# Patient Record
Sex: Male | Born: 1940 | Race: Black or African American | Hispanic: No | Marital: Married | Smoking: Former smoker
Health system: Southern US, Community
[De-identification: ages and names within clinical notes are randomized; demographics above are authoritative.]

## PROBLEM LIST (undated history)

## (undated) DIAGNOSIS — K922 Gastrointestinal hemorrhage, unspecified: Secondary | ICD-10-CM

## (undated) DIAGNOSIS — K219 Gastro-esophageal reflux disease without esophagitis: Secondary | ICD-10-CM

## (undated) DIAGNOSIS — D62 Acute posthemorrhagic anemia: Secondary | ICD-10-CM

## (undated) DIAGNOSIS — E118 Type 2 diabetes mellitus with unspecified complications: Secondary | ICD-10-CM

## (undated) DIAGNOSIS — G039 Meningitis, unspecified: Secondary | ICD-10-CM

## (undated) DIAGNOSIS — E119 Type 2 diabetes mellitus without complications: Secondary | ICD-10-CM

## (undated) DIAGNOSIS — H269 Unspecified cataract: Secondary | ICD-10-CM

## (undated) DIAGNOSIS — J189 Pneumonia, unspecified organism: Secondary | ICD-10-CM

## (undated) DIAGNOSIS — K621 Rectal polyp: Secondary | ICD-10-CM

## (undated) DIAGNOSIS — I10 Essential (primary) hypertension: Secondary | ICD-10-CM

## (undated) DIAGNOSIS — E785 Hyperlipidemia, unspecified: Secondary | ICD-10-CM

## (undated) HISTORY — DX: Gastro-esophageal reflux disease without esophagitis: K21.9

## (undated) HISTORY — DX: Hyperlipidemia, unspecified: E78.5

## (undated) HISTORY — DX: Pneumonia, unspecified organism: J18.9

## (undated) HISTORY — DX: Type 2 diabetes mellitus with unspecified complications: E11.8

## (undated) HISTORY — DX: Unspecified cataract: H26.9

## (undated) HISTORY — DX: Essential (primary) hypertension: I10

## (undated) HISTORY — DX: Type 2 diabetes mellitus without complications: E11.9

## (undated) HISTORY — DX: Meningitis, unspecified: G03.9

## (undated) HISTORY — PX: DENTAL SURGERY: SHX609

---

## 2014-02-26 DIAGNOSIS — H4011X2 Primary open-angle glaucoma, moderate stage: Secondary | ICD-10-CM | POA: Diagnosis not present

## 2017-01-11 DIAGNOSIS — E119 Type 2 diabetes mellitus without complications: Secondary | ICD-10-CM | POA: Diagnosis not present

## 2017-01-11 DIAGNOSIS — Z961 Presence of intraocular lens: Secondary | ICD-10-CM | POA: Diagnosis not present

## 2017-01-11 DIAGNOSIS — H524 Presbyopia: Secondary | ICD-10-CM | POA: Diagnosis not present

## 2017-01-11 DIAGNOSIS — H401133 Primary open-angle glaucoma, bilateral, severe stage: Secondary | ICD-10-CM | POA: Diagnosis not present

## 2017-05-09 DIAGNOSIS — H401133 Primary open-angle glaucoma, bilateral, severe stage: Secondary | ICD-10-CM | POA: Diagnosis not present

## 2017-05-27 ENCOUNTER — Ambulatory Visit (INDEPENDENT_AMBULATORY_CARE_PROVIDER_SITE_OTHER): Payer: Medicare Other | Admitting: Family Medicine

## 2017-05-27 ENCOUNTER — Encounter: Payer: Self-pay | Admitting: Family Medicine

## 2017-05-27 ENCOUNTER — Other Ambulatory Visit: Payer: Self-pay

## 2017-05-27 VITALS — BP 200/98 | HR 43 | Temp 98.1°F | Resp 16 | Ht 69.5 in | Wt 173.2 lb

## 2017-05-27 DIAGNOSIS — I10 Essential (primary) hypertension: Secondary | ICD-10-CM | POA: Insufficient documentation

## 2017-05-27 DIAGNOSIS — I499 Cardiac arrhythmia, unspecified: Secondary | ICD-10-CM | POA: Diagnosis not present

## 2017-05-27 DIAGNOSIS — J209 Acute bronchitis, unspecified: Secondary | ICD-10-CM

## 2017-05-27 DIAGNOSIS — E782 Mixed hyperlipidemia: Secondary | ICD-10-CM | POA: Diagnosis not present

## 2017-05-27 DIAGNOSIS — J0100 Acute maxillary sinusitis, unspecified: Secondary | ICD-10-CM

## 2017-05-27 DIAGNOSIS — E119 Type 2 diabetes mellitus without complications: Secondary | ICD-10-CM | POA: Diagnosis not present

## 2017-05-27 MED ORDER — AZITHROMYCIN 250 MG PO TABS: ORAL_TABLET | ORAL | 0 refills | Status: DC

## 2017-05-27 MED ORDER — ROSUVASTATIN CALCIUM 5 MG PO TABS: ORAL_TABLET | ORAL | 0 refills | Status: DC

## 2017-05-27 MED ORDER — PREDNISONE 20 MG PO TABS: 40.0000 mg | ORAL_TABLET | Freq: Every day | ORAL | 0 refills | Status: DC

## 2017-05-27 MED ORDER — LOSARTAN POTASSIUM 50 MG PO TABS: 50.0000 mg | ORAL_TABLET | Freq: Every day | ORAL | 1 refills | Status: DC

## 2017-05-27 NOTE — Assessment & Plan Note (Addendum)
BP very elevated, He is asymptomatic, discussed risk for heart disease and stroke, states he feels fine besides the cold symptoms  When asked about arrhythmia states he has had a long time, never seen cardiologist, he is asymptomatic   Unforunately he does not seem to go to the doctor very often, few notes in care everywhere  With his HR in 50-60's will not give him clonidine  will have him return home and take his losartan but increasae to 50mg  Recheck BP on Monday

## 2017-05-27 NOTE — Progress Notes (Signed)
Subjective:    Patient ID: Ryan Mosley, male    DOB: [birth year], 77 y.o.   MRN: [id number]  Patient presents for New Patient (Initial Visit)  Here to establish care.  His previous PCP with [facility] internal medicine Dr. Baldemar Lenis Ophthalmology Dr. Louann Sjogren with wife ,Has 4 children , retired [employer]   History of diabetes mellitus but is not taking any of his medications.  The last visit noted in the chart from July 2018 states that he was on glipizide he had not been taking it because he felt it was causing diarrhea.  But he has a bottle of metformin not taking that either, The last A1c was 7.3%. CBG this morning  116, highest per patient gets up to 120's, check every other day  hypertension he was prescribed losartan had been taking that medication but did not take today,. His last BP recorded at PCP was 140/82   Hyperlipidemia prescribed Lipitor in the past but he stopped taking secondary to leg pain, he was also prescribed Crestor at one point.  Allergies/nasal congestion- gven zyrtec - bottle from 2017, and singular neither have helped, has not seen ENT/ or allergies  he gets congested with sinus pressure and drainage, no fever , has also used sprays in the past   Has been coughing phlegm for past few weeks, also blowing nose a lot , has had wheezing  He has been using advil sinus  qUIT SMOKING 10-15 years, ago, occ etoh chews tobacco     He declines Pneumonia, TDAP, SHinlges, gets flu shot every year   Review Of Systems:  GEN- denies fatigue, fever, weight loss,weakness, recent illness HEENT- denies eye drainage, change in vision, nasal discharge, CVS- denies chest pain, palpitations RESP- denies SOB,+ cough, +wheeze ABD- denies N/V, change in stools, abd pain GU- denies dysuria, hematuria, dribbling, incontinence MSK- denies joint pain, muscle aches, injury Neuro- denies headache, dizziness, syncope, seizure activity       Objective:     BP (!) 200/98 (BP Location: Left Arm)   Pulse (!) 43   Temp 98.1 F (36.7 C) (Oral)   Resp 16   Ht 5' 9.5" (1.765 m)   Wt 173 lb 3.2 oz (78.6 kg)   SpO2 98%   BMI 25.21 kg/m  GEN- NAD, alert and oriented x3 HEENT- PERRL, EOMI, non injected sclera, pink conjunctiva, MMM, oropharynx clear, nasal congestion , enlarged turbinates, TM clear bilat no effusion, mild maxillary sinus tenderness  Neck- Supple, no thyromegaly, no LAD CVS- bradycardia , irregular rhythem no murmur RESP- scattered wheeze, normal WOB, no rales, mild congestion  ABD-NABS,soft,NT,ND EXT- No edema Pulses- Radial 2+  EKG- multiple PVC, bigeminy rhythm , p waves noted       Assessment & Plan:   Stop decngestants    Problem List Items Addressed This Visit      Unprioritized   Diabetes mellitus without complication ([place])    Uncontrolled per previous note, no recent A1C refuses to take any medications due to side effects Check renal function and cholesteorol       Relevant Medications   losartan (COZAAR) 50 MG tablet   rosuvastatin (CRESTOR) 5 MG tablet   Other Relevant Orders   Hemoglobin A1c (Completed)   Lipid panel (Completed)   Hypertension    BP very elevated, He is asymptomatic, discussed risk for heart disease and stroke, states he feels fine besides the cold symptoms  When asked about arrhythmia states he has had a  long time, never seen cardiologist, he is asymptomatic   Unforunately he does not seem to go to the doctor very often, few notes in care everywhere  With his HR in 50-60's will not give him clonidine  will have him return home and take his losartan but increasae to 50mg  Recheck BP on Monday       Relevant Medications   losartan (COZAAR) 50 MG tablet   rosuvastatin (CRESTOR) 5 MG tablet   Other Relevant Orders   CBC with Differential/Platelet (Completed)   Comprehensive metabolic panel (Completed)   Hyperlipemia    He agrees to take statin once a week      Relevant  Medications   losartan (COZAAR) 50 MG tablet   rosuvastatin (CRESTOR) 5 MG tablet    Other Visit Diagnoses    Cardiac arrhythmia, unspecified cardiac arrhythmia type    -  Primary   Multiple PVC, sinus rhythem,   Relevant Medications   losartan (COZAAR) 50 MG tablet   rosuvastatin (CRESTOR) 5 MG tablet   Other Relevant Orders   EKG 12-Lead (Completed)   Acute bronchitis, unspecified organism       Treat with prednisone, without known A1C high risl for PNA with his age, given zpak, robitussin DM or coricidan   Acute non-recurrent maxillary sinusitis       Relevant Medications   azithromycin (ZITHROMAX) 250 MG tablet   predniSONE (DELTASONE) 20 MG tablet      Note: This dictation was prepared with Dragon dictation along with smaller phrase technology. Any transcriptional errors that result from this process are unintentional.

## 2017-05-27 NOTE — Assessment & Plan Note (Signed)
Uncontrolled per previous note, no recent A1C refuses to take any medications due to side effects Check renal function and cholesteorol

## 2017-05-27 NOTE — Patient Instructions (Addendum)
Take 2 of the losartan tablets 50mg  when you get home New script will be for 50mg  Take the crestor- cholesterol pill once a week  Take antibiotics and the prednisone, use robitussin or Coricidan for cough DO NOT TAKE THE ADVIL SINUS PILL F/U Monday to review your labs and recheck blood pressure

## 2017-05-28 LAB — CBC WITH DIFFERENTIAL/PLATELET
Basophils Absolute: 30 cells/uL (ref 0–200)
Basophils Relative: 0.3 %
Eosinophils Absolute: 129 cells/uL (ref 15–500)
Eosinophils Relative: 1.3 %
HCT: 45.5 % (ref 38.5–50.0)
Hemoglobin: 15.3 g/dL (ref 13.2–17.1)
Lymphs Abs: 2010 cells/uL (ref 850–3900)
MCH: 29.7 pg (ref 27.0–33.0)
MCHC: 33.6 g/dL (ref 32.0–36.0)
MCV: 88.2 fL (ref 80.0–100.0)
MPV: 10 fL (ref 7.5–12.5)
Monocytes Relative: 8.7 %
Neutro Abs: 6871 cells/uL (ref 1500–7800)
Neutrophils Relative %: 69.4 %
Platelets: 241 10*3/uL (ref 140–400)
RBC: 5.16 10*6/uL (ref 4.20–5.80)
RDW: 12.7 % (ref 11.0–15.0)
Total Lymphocyte: 20.3 %
WBC mixed population: 861 cells/uL (ref 200–950)
WBC: 9.9 10*3/uL (ref 3.8–10.8)

## 2017-05-28 LAB — COMPREHENSIVE METABOLIC PANEL
AG Ratio: 1.5 (calc) (ref 1.0–2.5)
ALT: 22 U/L (ref 9–46)
AST: 17 U/L (ref 10–35)
Albumin: 4.4 g/dL (ref 3.6–5.1)
Alkaline phosphatase (APISO): 94 U/L (ref 40–115)
BUN: 12 mg/dL (ref 7–25)
CO2: 26 mmol/L (ref 20–32)
Calcium: 9.4 mg/dL (ref 8.6–10.3)
Chloride: 104 mmol/L (ref 98–110)
Creat: 0.86 mg/dL (ref 0.70–1.18)
Globulin: 2.9 g/dL (calc) (ref 1.9–3.7)
Glucose, Bld: 127 mg/dL — ABNORMAL HIGH (ref 65–99)
Potassium: 3.9 mmol/L (ref 3.5–5.3)
Sodium: 139 mmol/L (ref 135–146)
Total Bilirubin: 0.6 mg/dL (ref 0.2–1.2)
Total Protein: 7.3 g/dL (ref 6.1–8.1)

## 2017-05-28 LAB — LIPID PANEL
Cholesterol: 236 mg/dL — ABNORMAL HIGH (ref <200)
HDL: 37 mg/dL — ABNORMAL LOW (ref >40)
LDL Cholesterol (Calc): 175 mg/dL (calc) — ABNORMAL HIGH
Non-HDL Cholesterol (Calc): 199 mg/dL (calc) — ABNORMAL HIGH (ref <130)
Total CHOL/HDL Ratio: 6.4 (calc) — ABNORMAL HIGH (ref <5.0)
Triglycerides: 112 mg/dL (ref <150)

## 2017-05-28 LAB — HEMOGLOBIN A1C
Hgb A1c MFr Bld: 6.9 % of total Hgb — ABNORMAL HIGH (ref <5.7)
Mean Plasma Glucose: 151 (calc)
eAG (mmol/L): 8.4 (calc)

## 2017-05-29 DIAGNOSIS — E785 Hyperlipidemia, unspecified: Secondary | ICD-10-CM | POA: Insufficient documentation

## 2017-05-29 NOTE — Assessment & Plan Note (Signed)
He agrees to take statin once a week

## 2017-05-30 ENCOUNTER — Other Ambulatory Visit: Payer: Self-pay

## 2017-05-30 ENCOUNTER — Encounter: Payer: Self-pay | Admitting: Family Medicine

## 2017-05-30 ENCOUNTER — Ambulatory Visit (INDEPENDENT_AMBULATORY_CARE_PROVIDER_SITE_OTHER): Payer: Medicare Other | Admitting: Family Medicine

## 2017-05-30 VITALS — BP 152/68 | HR 56 | Temp 97.9°F | Resp 16 | Ht 69.5 in | Wt 174.0 lb

## 2017-05-30 DIAGNOSIS — E119 Type 2 diabetes mellitus without complications: Secondary | ICD-10-CM

## 2017-05-30 DIAGNOSIS — E782 Mixed hyperlipidemia: Secondary | ICD-10-CM | POA: Diagnosis not present

## 2017-05-30 DIAGNOSIS — I10 Essential (primary) hypertension: Secondary | ICD-10-CM | POA: Diagnosis not present

## 2017-05-30 NOTE — Progress Notes (Signed)
   Subjective:    Patient ID: Ryan Mosley, male    DOB: 1940-03-01, 77 y.o.   MRN: [id number]  Patient presents for Follow-up (recheck BP)  Pt here for BP f/u, and REVIEW Of labs  Wife also present A1C 6.9%, no current meds, normal renal and liver function Hyperlioidemia- LDL elevated175 HTN- taking losartan 50mg  BP 153/79 this morning states that he does feel better  Also completing antibiotics and the prednisone his symptoms have improved.  Regard to the cardiac arrhythmia he does not want anything done at this time states that he feels fine has had for a while   Review Of Systems:  GEN- denies fatigue, fever, weight loss,weakness, recent illness HEENT- denies eye drainage, change in vision, nasal discharge, CVS- denies chest pain, palpitations RESP- denies SOB, cough, wheeze Neuro- denies headache, dizziness, syncope, seizure activity       Objective:    BP (!) 152/68   Pulse (!) 56   Temp 97.9 F (36.6 C) (Oral)   Resp 16   Ht 5' 9.5" (1.765 m)   Wt 174 lb (78.9 kg)   SpO2 96%   BMI 25.33 kg/m  GEN- NAD, alert and oriented x3 HEENT- PERRL, EOMI, non injected sclera, pink conjunctiva, MMM, oropharynx clear CVS- bradycardia no murmur RESP-CTAB EXT- No edema Pulses- Radial 2+        Assessment & Plan:      Problem List Items Addressed This Visit      Unprioritized   Hypertension - Primary    Pressure is much improved continue losartan 50 mg.  He will continue to monitor at home.      Hyperlipemia    He will take Crestor once a week to reduce his myalgias      Diabetes mellitus without complication ([place])    At this time we discussed to monitor his blood sugars.  Seems that he had a lot of hypoglycemic symptoms when he was on medications.  A1c currently 6.9%         Note: This dictation was prepared with Dragon dictation along with smaller phrase technology. Any transcriptional errors that result from this process are unintentional.

## 2017-05-30 NOTE — Patient Instructions (Signed)
F/U  3 months 

## 2017-05-31 ENCOUNTER — Encounter: Payer: Self-pay | Admitting: Family Medicine

## 2017-05-31 NOTE — Assessment & Plan Note (Signed)
At this time we discussed to monitor his blood sugars.  Seems that he had a lot of hypoglycemic symptoms when he was on medications.  A1c currently 6.9%

## 2017-05-31 NOTE — Assessment & Plan Note (Signed)
Pressure is much improved continue losartan 50 mg.  He will continue to monitor at home.

## 2017-05-31 NOTE — Assessment & Plan Note (Signed)
He will take Crestor once a week to reduce his myalgias

## 2017-09-05 ENCOUNTER — Other Ambulatory Visit: Payer: Self-pay

## 2017-09-05 ENCOUNTER — Encounter: Payer: Self-pay | Admitting: Family Medicine

## 2017-09-05 ENCOUNTER — Ambulatory Visit (INDEPENDENT_AMBULATORY_CARE_PROVIDER_SITE_OTHER): Payer: Medicare Other | Admitting: Family Medicine

## 2017-09-05 VITALS — BP 148/68 | HR 48 | Temp 98.1°F | Resp 16 | Ht 69.5 in | Wt 173.0 lb

## 2017-09-05 DIAGNOSIS — I10 Essential (primary) hypertension: Secondary | ICD-10-CM | POA: Diagnosis not present

## 2017-09-05 DIAGNOSIS — E782 Mixed hyperlipidemia: Secondary | ICD-10-CM | POA: Diagnosis not present

## 2017-09-05 DIAGNOSIS — E119 Type 2 diabetes mellitus without complications: Secondary | ICD-10-CM | POA: Diagnosis not present

## 2017-09-05 MED ORDER — ROSUVASTATIN CALCIUM 5 MG PO TABS: ORAL_TABLET | ORAL | 2 refills | Status: DC

## 2017-09-05 MED ORDER — LISINOPRIL 10 MG PO TABS: 10.0000 mg | ORAL_TABLET | Freq: Every day | ORAL | 6 refills | Status: DC

## 2017-09-05 NOTE — Patient Instructions (Signed)
F/U 6 months for Physical  

## 2017-09-05 NOTE — Assessment & Plan Note (Signed)
Recheck A1C, diet controlled Continue checking fasting CBG Based on our few visits difficult to get him on board with taking medications

## 2017-09-05 NOTE — Progress Notes (Signed)
   Subjective:    Patient ID: Ryan Mosley, male    DOB: [birth year], 77 y.o.   MRN: [id number]  Patient presents for Follow-up (is fasting)  Pt here to f/u HTN and DM  DM- no current medications, A1C was 6.9% in April, states sugars run good  HTN- stopped losartan a few weeks ago, bottle had about 20-30 tabs out of 90 days supply. He would skip some days or forget, he states the losartan gave him all kinds of side effects, headache, body aches, blurry vision, so he stopped it and has been fine.   Hyperlipidemia- takes crestor once a week    Review Of Systems: per above  GEN- denies fatigue, fever, weight loss,weakness, recent illness HEENT- denies eye drainage, change in vision, nasal discharge, CVS- denies chest pain, palpitations RESP- denies SOB, cough, wheeze ABD- denies N/V, change in stools, abd pain GU- denies dysuria, hematuria, dribbling, incontinence MSK- denies joint pain, muscle aches, injury Neuro- denies headache, dizziness, syncope, seizure activity       Objective:    BP (!) 148/68   Pulse (!) 48   Temp 98.1 F (36.7 C) (Oral)   Resp 16   Ht 5' 9.5" (1.765 m)   Wt 173 lb (78.5 kg)   SpO2 97%   BMI 25.18 kg/m  GEN- NAD, alert and oriented x3 HEENT- PERRL, EOMI, non injected sclera, pink conjunctiva, MMM, oropharynx clear Neck- Supple, no thyromegaly CVS- bradycardia, irregular rhythem no murmur RESP-CTAB EXT- No edema Pulses- Radial, DP- 2+        Assessment & Plan:      Problem List Items Addressed This Visit    None      Note: This dictation was prepared with Dragon dictation along with smaller phrase technology. Any transcriptional errors that result from this process are unintentional.

## 2017-09-05 NOTE — Assessment & Plan Note (Signed)
Continue crestor weekly

## 2017-09-05 NOTE — Assessment & Plan Note (Signed)
BP does not look to bad Pt adamant the pill caused him all kinds of SE Will d/c try lisinopril 10mg  once a day  Advised to call me if any concerns instead of just stopping and not alerting anyone

## 2017-09-06 LAB — COMPREHENSIVE METABOLIC PANEL
AG Ratio: 1.5 (calc) (ref 1.0–2.5)
ALT: 16 U/L (ref 9–46)
AST: 17 U/L (ref 10–35)
Albumin: 4.5 g/dL (ref 3.6–5.1)
Alkaline phosphatase (APISO): 81 U/L (ref 40–115)
BUN: 13 mg/dL (ref 7–25)
CO2: 26 mmol/L (ref 20–32)
Calcium: 9.5 mg/dL (ref 8.6–10.3)
Chloride: 104 mmol/L (ref 98–110)
Creat: 0.84 mg/dL (ref 0.70–1.18)
Globulin: 3.1 g/dL (calc) (ref 1.9–3.7)
Glucose, Bld: 146 mg/dL — ABNORMAL HIGH (ref 65–99)
Potassium: 4.1 mmol/L (ref 3.5–5.3)
Sodium: 140 mmol/L (ref 135–146)
Total Bilirubin: 0.5 mg/dL (ref 0.2–1.2)
Total Protein: 7.6 g/dL (ref 6.1–8.1)

## 2017-09-06 LAB — CBC WITH DIFFERENTIAL/PLATELET
Basophils Absolute: 38 cells/uL (ref 0–200)
Basophils Relative: 0.5 %
Eosinophils Absolute: 190 cells/uL (ref 15–500)
Eosinophils Relative: 2.5 %
HCT: 47 % (ref 38.5–50.0)
Hemoglobin: 15.3 g/dL (ref 13.2–17.1)
Lymphs Abs: 1870 cells/uL (ref 850–3900)
MCH: 29.4 pg (ref 27.0–33.0)
MCHC: 32.6 g/dL (ref 32.0–36.0)
MCV: 90.2 fL (ref 80.0–100.0)
MPV: 10.4 fL (ref 7.5–12.5)
Monocytes Relative: 9.9 %
Neutro Abs: 4750 cells/uL (ref 1500–7800)
Neutrophils Relative %: 62.5 %
Platelets: 229 10*3/uL (ref 140–400)
RBC: 5.21 10*6/uL (ref 4.20–5.80)
RDW: 12.1 % (ref 11.0–15.0)
Total Lymphocyte: 24.6 %
WBC mixed population: 752 cells/uL (ref 200–950)
WBC: 7.6 10*3/uL (ref 3.8–10.8)

## 2017-09-06 LAB — HEMOGLOBIN A1C
Hgb A1c MFr Bld: 7.1 % of total Hgb — ABNORMAL HIGH (ref <5.7)
Mean Plasma Glucose: 157 (calc)
eAG (mmol/L): 8.7 (calc)

## 2017-09-06 LAB — LIPID PANEL
Cholesterol: 193 mg/dL (ref <200)
HDL: 37 mg/dL — ABNORMAL LOW (ref >40)
LDL Cholesterol (Calc): 139 mg/dL (calc) — ABNORMAL HIGH
Non-HDL Cholesterol (Calc): 156 mg/dL (calc) — ABNORMAL HIGH (ref <130)
Total CHOL/HDL Ratio: 5.2 (calc) — ABNORMAL HIGH (ref <5.0)
Triglycerides: 80 mg/dL (ref <150)

## 2017-09-09 DIAGNOSIS — H401131 Primary open-angle glaucoma, bilateral, mild stage: Secondary | ICD-10-CM | POA: Diagnosis not present

## 2017-11-21 ENCOUNTER — Other Ambulatory Visit: Payer: Self-pay | Admitting: Family Medicine

## 2017-12-13 ENCOUNTER — Other Ambulatory Visit: Payer: Self-pay | Admitting: *Deleted

## 2018-03-10 ENCOUNTER — Other Ambulatory Visit: Payer: Self-pay | Admitting: Family Medicine

## 2018-03-10 ENCOUNTER — Encounter: Payer: Medicare Other | Admitting: Family Medicine

## 2018-03-13 ENCOUNTER — Other Ambulatory Visit: Payer: Self-pay

## 2018-03-13 ENCOUNTER — Encounter: Payer: Self-pay | Admitting: Family Medicine

## 2018-03-13 ENCOUNTER — Ambulatory Visit (INDEPENDENT_AMBULATORY_CARE_PROVIDER_SITE_OTHER): Payer: Medicare Other | Admitting: Family Medicine

## 2018-03-13 VITALS — BP 142/86 | HR 78 | Temp 97.9°F | Resp 16 | Ht 69.5 in | Wt 166.0 lb

## 2018-03-13 DIAGNOSIS — I10 Essential (primary) hypertension: Secondary | ICD-10-CM | POA: Diagnosis not present

## 2018-03-13 DIAGNOSIS — E119 Type 2 diabetes mellitus without complications: Secondary | ICD-10-CM

## 2018-03-13 DIAGNOSIS — Z0000 Encounter for general adult medical examination without abnormal findings: Secondary | ICD-10-CM

## 2018-03-13 DIAGNOSIS — E782 Mixed hyperlipidemia: Secondary | ICD-10-CM

## 2018-03-13 NOTE — Progress Notes (Signed)
Subjective:   Patient presents for Medicare Annual/Subsequent preventive examination.   Pt here for wellness exam. Weight down 7lbs since July,s tates his appetite is good, some times he is out doing a lot working he will lose weight.  He denies any pain anywhere.  Does not want any excessive testing.   Hyperlipidemia- taking    HTN- taking lisinopril as prescribed   No concerns   Review Past Medical/Family/Social: Per EMR    Risk Factors  Current exercise habits: yes Dietary issues discussed: YES  Cardiac risk factors: Obesity (BMI >= 30 kg/m2).   Depression Screen  (Note: if answer to either of the following is "Yes", a more complete depression screening is indicated)  Over the past two weeks, have you felt down, depressed or hopeless? No Over the past two weeks, have you felt little interest or pleasure in doing things? No Have you lost interest or pleasure in daily life? No Do you often feel hopeless? No Do you cry easily over simple problems? No   Activities of Daily Living  In your present state of health, do you have any difficulty performing the following activities?:  Driving? No  Managing money? No  Feeding yourself? No  Getting from bed to chair? No  Climbing a flight of stairs? No  Preparing food and eating?: No  Bathing or showering? No  Getting dressed: No  Getting to the toilet? No  Using the toilet:No  Moving around from place to place: No  In the past year have you fallen or had a near fall?:No  Are you sexually active? No  Do you have more than one partner? No   Hearing Difficulties: No  Do you often ask people to speak up or repeat themselves? No  Do you experience ringing or noises in your ears? No Do you have difficulty understanding soft or whispered voices? No  Do you feel that you have a problem with memory? No Do you often misplace items? No  Do you feel safe at home? Yes  Cognitive Testing  Alert? Yes Normal Appearance?Yes  Oriented to  person? Yes Place? Yes  Time? Yes  Recall of three objects? Yes  Can perform simple calculations? Yes  Displays appropriate judgment?Yes  Can read the correct time from a watch face?Yes   List the Names of Other Physician/Practitioners you currently use:   Dr. Jearld Fenton    Screening Tests / Date Colonoscopy over age                     Zostavax Declines  Pneumonia- declines  Influenza Vaccine UTD Tetanus/tdap Declines   ROS: GEN- denies fatigue, fever, weight loss,weakness, recent illness HEENT- denies eye drainage, change in vision, nasal discharge, CVS- denies chest pain, palpitations RESP- denies SOB, cough, wheeze ABD- denies N/V, change in stools, abd pain GU- denies dysuria, hematuria, dribbling, incontinence MSK- denies joint pain, muscle aches, injury Neuro- denies headache, dizziness, syncope, seizure activity  Physical: Vitals reviewed  GEN- NAD, alert and oriented x3 HEENT- PERRL, EOMI, non injected sclera, pink conjunctiva, MMM, oropharynx clear Neck- Supple, no thryomegaly, no bruit  CVS- irregular rhythem, normal rate ( ? PVC), no murmur RESP-CTAB ABD-NABS,soft,NT,ND EXT- No edema Pulses- Radial, DP- 2+   Assessment:    Annual wellness medicare exam   Plan:    During the course of the visit the patient was educated and counseled about appropriate screening and preventive services including:  FALL/ADUIT C/ Depression screen  Negative  Weight loss-declines  any pain has a good appetite.  He has had some stress as his wife was recently in the hospital.  Discussed trying Ensure boost or multivitamins he states that it was too expensive and that he would not full with it.  Also gave him the idea of Carnation instant breakfast.  Hypertension his blood pressure is controlled for his age we will continue the lisinopril he does not typically want to take any medications.  He is also taking his statin drug we will check his lipid panel.  Declines  PSA testing  which is reasonable at his age.  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  Cardiac yarrhythmia chronic without any symptoms.  He does not want any evaluation for this.   Patient Instructions (the written plan) was given to the patient.  Medicare Attestation  I have personally reviewed:  The patient's medical and social history  Their use of alcohol, tobacco or illicit drugs  Their current medications and supplements  The patient's functional ability including ADLs,fall risks, home safety risks, cognitive, and hearing and visual impairment  Diet and physical activities  Evidence for depression or mood disorders  The patient's weight, height, BMI, and visual acuity have been recorded in the chart. I have made referrals, counseling, and provided education to the patient based on review of the above and I have provided the patient with a written personalized care plan for preventive services.

## 2018-03-13 NOTE — Patient Instructions (Addendum)
Try the carnation instant breakfast  Multivitamin over 50 F/U 6 months

## 2018-03-14 LAB — LIPID PANEL
Cholesterol: 178 mg/dL (ref <200)
HDL: 39 mg/dL — ABNORMAL LOW (ref >40)
LDL Cholesterol (Calc): 122 mg/dL (calc) — ABNORMAL HIGH
Non-HDL Cholesterol (Calc): 139 mg/dL (calc) — ABNORMAL HIGH (ref <130)
Total CHOL/HDL Ratio: 4.6 (calc) (ref <5.0)
Triglycerides: 78 mg/dL (ref <150)

## 2018-03-14 LAB — HEMOGLOBIN A1C
Hgb A1c MFr Bld: 6.4 % of total Hgb — ABNORMAL HIGH (ref <5.7)
Mean Plasma Glucose: 137 (calc)
eAG (mmol/L): 7.6 (calc)

## 2018-03-14 LAB — COMPREHENSIVE METABOLIC PANEL
AG Ratio: 1.6 (calc) (ref 1.0–2.5)
ALT: 18 U/L (ref 9–46)
AST: 16 U/L (ref 10–35)
Albumin: 4.4 g/dL (ref 3.6–5.1)
Alkaline phosphatase (APISO): 74 U/L (ref 40–115)
BUN: 14 mg/dL (ref 7–25)
CO2: 27 mmol/L (ref 20–32)
Calcium: 9.5 mg/dL (ref 8.6–10.3)
Chloride: 105 mmol/L (ref 98–110)
Creat: 0.98 mg/dL (ref 0.70–1.18)
Globulin: 2.8 g/dL (calc) (ref 1.9–3.7)
Glucose, Bld: 120 mg/dL — ABNORMAL HIGH (ref 65–99)
Potassium: 3.6 mmol/L (ref 3.5–5.3)
Sodium: 141 mmol/L (ref 135–146)
Total Bilirubin: 0.7 mg/dL (ref 0.2–1.2)
Total Protein: 7.2 g/dL (ref 6.1–8.1)

## 2018-03-14 LAB — CBC WITH DIFFERENTIAL/PLATELET
Absolute Monocytes: 661 cells/uL (ref 200–950)
Basophils Absolute: 38 cells/uL (ref 0–200)
Basophils Relative: 0.5 %
Eosinophils Absolute: 68 cells/uL (ref 15–500)
Eosinophils Relative: 0.9 %
HCT: 46.3 % (ref 38.5–50.0)
Hemoglobin: 15.5 g/dL (ref 13.2–17.1)
Lymphs Abs: 1763 cells/uL (ref 850–3900)
MCH: 30.5 pg (ref 27.0–33.0)
MCHC: 33.5 g/dL (ref 32.0–36.0)
MCV: 91.1 fL (ref 80.0–100.0)
MPV: 10.8 fL (ref 7.5–12.5)
Monocytes Relative: 8.7 %
Neutro Abs: 5069 cells/uL (ref 1500–7800)
Neutrophils Relative %: 66.7 %
Platelets: 207 10*3/uL (ref 140–400)
RBC: 5.08 10*6/uL (ref 4.20–5.80)
RDW: 12.6 % (ref 11.0–15.0)
Total Lymphocyte: 23.2 %
WBC: 7.6 10*3/uL (ref 3.8–10.8)

## 2018-03-16 ENCOUNTER — Encounter: Payer: Self-pay | Admitting: *Deleted

## 2018-03-20 DIAGNOSIS — H5212 Myopia, left eye: Secondary | ICD-10-CM | POA: Diagnosis not present

## 2018-03-20 DIAGNOSIS — E119 Type 2 diabetes mellitus without complications: Secondary | ICD-10-CM | POA: Diagnosis not present

## 2018-03-20 DIAGNOSIS — H52203 Unspecified astigmatism, bilateral: Secondary | ICD-10-CM | POA: Diagnosis not present

## 2018-03-20 DIAGNOSIS — H524 Presbyopia: Secondary | ICD-10-CM | POA: Diagnosis not present

## 2018-03-20 DIAGNOSIS — H401133 Primary open-angle glaucoma, bilateral, severe stage: Secondary | ICD-10-CM | POA: Diagnosis not present

## 2018-07-24 DIAGNOSIS — H532 Diplopia: Secondary | ICD-10-CM | POA: Diagnosis not present

## 2018-07-24 DIAGNOSIS — H401133 Primary open-angle glaucoma, bilateral, severe stage: Secondary | ICD-10-CM | POA: Diagnosis not present

## 2018-09-08 ENCOUNTER — Other Ambulatory Visit: Payer: Self-pay

## 2018-09-11 ENCOUNTER — Encounter: Payer: Self-pay | Admitting: Family Medicine

## 2018-09-11 ENCOUNTER — Ambulatory Visit (INDEPENDENT_AMBULATORY_CARE_PROVIDER_SITE_OTHER): Payer: Medicare Other | Admitting: Family Medicine

## 2018-09-11 VITALS — BP 170/88 | HR 86 | Temp 98.1°F | Resp 14 | Ht 69.5 in | Wt 168.0 lb

## 2018-09-11 DIAGNOSIS — E782 Mixed hyperlipidemia: Secondary | ICD-10-CM

## 2018-09-11 DIAGNOSIS — J309 Allergic rhinitis, unspecified: Secondary | ICD-10-CM | POA: Diagnosis not present

## 2018-09-11 DIAGNOSIS — I10 Essential (primary) hypertension: Secondary | ICD-10-CM

## 2018-09-11 DIAGNOSIS — E119 Type 2 diabetes mellitus without complications: Secondary | ICD-10-CM

## 2018-09-11 MED ORDER — SALINE SPRAY 0.65 % NA SOLN: 2.0000 | NASAL | 2 refills | Status: DC | PRN

## 2018-09-11 NOTE — Assessment & Plan Note (Signed)
Recommend that he use nasal saline over-the-counter there is no signs of sinusitis on examination today.

## 2018-09-11 NOTE — Progress Notes (Signed)
Subjective:    Patient ID: Ryan Mosley, male    DOB: 09/03/1940, 78 y.o.   MRN: [id number]  Patient presents for Follow-up (is fasting)  Patient here to follow-up chronic medical problems.  Medications reviewed. At his last visit in Jan  was noted that he had a cardiac arrhythmia but he did not want any evaluation or testing for this.  He also lost 7 pounds states that he has been more active but he had a good appetite did not want any work-up for this.  He is taking his blood pressure medicine lisinopril he is also taking Crestor 5 mg once a week. He is due for epeat renal and liver function as well as lipid panel  History of diabetes mellitus this is diet-controlled his last A1c was 6.4% in January.  Of note he has declined taking medications in the past. CBG at home fasting < 120  Still has nasal congestion at night using regular water feels like that work , has tried multiple allergy pills/sprays they dont work, does not want any more  Note seen by eye doctor in June for Glaucoma also had diplopia it has improved       Review Of Systems:  GEN- denies fatigue, fever, weight loss,weakness, recent illness HEENT- denies eye drainage, change in vision,+ nasal discharge, CVS- denies chest pain, palpitations RESP- denies SOB, cough, wheeze ABD- denies N/V, change in stools, abd pain GU- denies dysuria, hematuria, dribbling, incontinence MSK- denies joint pain, muscle aches, injury Neuro- denies headache, dizziness, syncope, seizure activity       Objective:    BP (!) 170/88 (BP Location: Right Arm, Patient Position: Sitting, Cuff Size: Normal)   Pulse 86   Temp 98.1 F (36.7 C) (Oral)   Resp 14   Ht 5' 9.5" (1.765 m)   Wt 168 lb (76.2 kg)   SpO2 97%   BMI 24.45 kg/m  GEN- NAD, alert and oriented x3 HEENT- PERRL, EOMI, non injected sclera, pink conjunctiva, MMM, oropharynx clear, nares clear, no sinus tenderness, TM clear no effusion Neck- Supple, no thryomegaly, no  bruit  CVS- RRR, no murmur  RESP-CTAB ABD-NABS,soft,NT,ND EXT- No edema Pulses- Radial, DP- 2+     Assessment & Plan:      Problem List Items Addressed This Visit      Unprioritized   Allergic rhinitis    Recommend that he use nasal saline over-the-counter there is no signs of sinusitis on examination today.      Diabetes mellitus without complication ([place])    Diet controlled check renal function and A1c      Relevant Orders   Hemoglobin A1c   HM DIABETES FOOT EXAM (Completed)   Hyperlipemia    Continue Crestor      Relevant Orders   Lipid panel   Hypertension - Primary    Blood pressure is elevated today.  He has not had his medication.  Recommend that he take his meds once he gets home as he is to check his blood pressure and record we will call him in 1 week.  The goal be to increase his lisinopril to 20 mg if blood pressure is still greater than 174 systolic over 90 diastolic.  He is often very reluctant to take more medication.      Relevant Orders   CBC with Differential/Platelet   Comprehensive metabolic panel      Note: This dictation was prepared with Dragon dictation along with smaller phrase technology. Any transcriptional errors that  result from this process are unintentional.

## 2018-09-11 NOTE — Assessment & Plan Note (Signed)
Diet controlled check renal function and A1c

## 2018-09-11 NOTE — Assessment & Plan Note (Signed)
Blood pressure is elevated today.  He has not had his medication.  Recommend that he take his meds once he gets home as he is to check his blood pressure and record we will call him in 1 week.  The goal be to increase his lisinopril to 20 mg if blood pressure is still greater than 806 systolic over 90 diastolic.  He is often very reluctant to take more medication.

## 2018-09-11 NOTE — Assessment & Plan Note (Signed)
Continue Crestor 

## 2018-09-11 NOTE — Patient Instructions (Addendum)
F/U 6 months for Physical  Use nasal saline  Check your blood pressure at home and record, we will call Monday

## 2018-09-12 LAB — CBC WITH DIFFERENTIAL/PLATELET
Absolute Monocytes: 610 cells/uL (ref 200–950)
Basophils Absolute: 47 cells/uL (ref 0–200)
Basophils Relative: 0.7 %
Eosinophils Absolute: 101 cells/uL (ref 15–500)
Eosinophils Relative: 1.5 %
HCT: 47.2 % (ref 38.5–50.0)
Hemoglobin: 15.5 g/dL (ref 13.2–17.1)
Lymphs Abs: 1869 cells/uL (ref 850–3900)
MCH: 29.8 pg (ref 27.0–33.0)
MCHC: 32.8 g/dL (ref 32.0–36.0)
MCV: 90.8 fL (ref 80.0–100.0)
MPV: 10.3 fL (ref 7.5–12.5)
Monocytes Relative: 9.1 %
Neutro Abs: 4074 cells/uL (ref 1500–7800)
Neutrophils Relative %: 60.8 %
Platelets: 216 10*3/uL (ref 140–400)
RBC: 5.2 10*6/uL (ref 4.20–5.80)
RDW: 12.6 % (ref 11.0–15.0)
Total Lymphocyte: 27.9 %
WBC: 6.7 10*3/uL (ref 3.8–10.8)

## 2018-09-12 LAB — LIPID PANEL
Cholesterol: 221 mg/dL — ABNORMAL HIGH (ref <200)
HDL: 42 mg/dL (ref >=40)
LDL Cholesterol (Calc): 161 mg/dL (calc) — ABNORMAL HIGH
Non-HDL Cholesterol (Calc): 179 mg/dL (calc) — ABNORMAL HIGH (ref <130)
Total CHOL/HDL Ratio: 5.3 (calc) — ABNORMAL HIGH (ref <5.0)
Triglycerides: 80 mg/dL (ref <150)

## 2018-09-12 LAB — COMPREHENSIVE METABOLIC PANEL
AG Ratio: 1.5 (calc) (ref 1.0–2.5)
ALT: 22 U/L (ref 9–46)
AST: 18 U/L (ref 10–35)
Albumin: 4.5 g/dL (ref 3.6–5.1)
Alkaline phosphatase (APISO): 78 U/L (ref 35–144)
BUN: 12 mg/dL (ref 7–25)
CO2: 27 mmol/L (ref 20–32)
Calcium: 9.4 mg/dL (ref 8.6–10.3)
Chloride: 105 mmol/L (ref 98–110)
Creat: 0.84 mg/dL (ref 0.70–1.18)
Globulin: 3.1 g/dL (calc) (ref 1.9–3.7)
Glucose, Bld: 121 mg/dL — ABNORMAL HIGH (ref 65–99)
Potassium: 4.5 mmol/L (ref 3.5–5.3)
Sodium: 140 mmol/L (ref 135–146)
Total Bilirubin: 0.6 mg/dL (ref 0.2–1.2)
Total Protein: 7.6 g/dL (ref 6.1–8.1)

## 2018-09-12 LAB — HEMOGLOBIN A1C
Hgb A1c MFr Bld: 6.3 % of total Hgb — ABNORMAL HIGH (ref <5.7)
Mean Plasma Glucose: 134 (calc)
eAG (mmol/L): 7.4 (calc)

## 2018-09-18 ENCOUNTER — Telehealth: Payer: Self-pay | Admitting: *Deleted

## 2018-09-18 NOTE — Telephone Encounter (Signed)
-----   Message from Alycia Rossetti, MD sent at 09/18/2018  2:10 PM EDT ----- Regarding: FW: Call Monday for BP readings  Call and see what BP readings are ----- Message ----- From: Alycia Rossetti, MD Sent: 09/18/2018 To: Alycia Rossetti, MD Subject: Call Monday for BP readings

## 2018-09-18 NOTE — Telephone Encounter (Signed)
Call placed to patient. No answer. No VM.  

## 2018-09-19 ENCOUNTER — Ambulatory Visit (INDEPENDENT_AMBULATORY_CARE_PROVIDER_SITE_OTHER): Payer: Medicare Other | Admitting: Family Medicine

## 2018-09-19 ENCOUNTER — Encounter: Payer: Self-pay | Admitting: Family Medicine

## 2018-09-19 ENCOUNTER — Other Ambulatory Visit: Payer: Self-pay

## 2018-09-19 VITALS — BP 210/102 | HR 74 | Temp 98.1°F | Resp 16 | Ht 69.5 in | Wt 168.0 lb

## 2018-09-19 DIAGNOSIS — I10 Essential (primary) hypertension: Secondary | ICD-10-CM | POA: Diagnosis not present

## 2018-09-19 DIAGNOSIS — I160 Hypertensive urgency: Secondary | ICD-10-CM

## 2018-09-19 MED ORDER — CLONIDINE HCL 0.1 MG PO TABS
0.1000 mg | ORAL_TABLET | Freq: Once | ORAL | Status: AC
  Administered 2018-09-19: 0.1 mg via ORAL

## 2018-09-19 MED ORDER — LISINOPRIL 20 MG PO TABS: 20.0000 mg | ORAL_TABLET | Freq: Every day | ORAL | 1 refills | Status: DC

## 2018-09-19 MED ORDER — ROSUVASTATIN CALCIUM 5 MG PO TABS: ORAL_TABLET | ORAL | 2 refills | Status: DC

## 2018-09-19 NOTE — Assessment & Plan Note (Signed)
Controlled high blood pressure.  Not consistent with meds per report.  Given clonidine in office blood pressure did come down.  We will increase his lisinopril to 20 mg at home.  He will take 2 of the 10 mg tablets we will follow-up Friday.  He is currently asymptomatic from his blood pressure being high.  I did advise him to take the extra 10 mg of lisinopril tonight tonight as I expect the clonidine will wear off by this evening

## 2018-09-19 NOTE — Patient Instructions (Addendum)
Take 2 of the lisinopril ( 10mg  ) tablets in the morning We will call Friday for blood pressure readings  F/U 3 weeks    Tonight take the 2nd lisinopril 10mg  tablet - so your pressure will stay down

## 2018-09-19 NOTE — Progress Notes (Signed)
   Subjective:    Patient ID: Ryan Mosley, male    DOB: 03-19-1940, 78 y.o.   MRN: [id number]  Patient presents for HTN     Patient here to follow-up hypertension.  He was initially a phone call to follow-up his blood pressure at home but he states that his systolic was in the 500X.  He denies any headache but has been a little tired.  He denies any palpitations chest pain.  He came in for nurse visit his blood pressure was confirmed to to be 200/100.  It was repeated multiple times.  He was converted to an office visit.  He states that he did take his lisinopril however his wife states on the phone that he often skips days.  He was given clonidine 0.1 mg in the office blood pressure was checked about 30 minutes afterwards was down to 170/90    Review Of Systems:  GEN- denies fatigue, fever, weight loss,weakness, recent illness HEENT- denies eye drainage, change in vision, nasal discharge, CVS- denies chest pain, palpitations RESP- denies SOB, cough, wheeze ABD- denies N/V, change in stools, abd pain Neuro- denies headache, dizziness, syncope, seizure activity       Objective:    BP (!) 210/102   Pulse 74   Temp 98.1 F (36.7 C) (Oral)   Resp 16   Ht 5' 9.5" (1.765 m)   Wt 168 lb (76.2 kg)   SpO2 98%   BMI 24.45 kg/m  GEN- NAD, alert and oriented x3 HEENT- PERRL, EOMI, non injected sclera, pink conjunctiva, MMM, oropharynx clear CVS- RRR, no murmur RESP-CTAB EXT- No edema Pulses- RadiaL 2+        Assessment & Plan:     f/u 3 WEEKS  Problem List Items Addressed This Visit      Unprioritized   Hypertension - Primary    Controlled high blood pressure.  Not consistent with meds per report.  Given clonidine in office blood pressure did come down.  We will increase his lisinopril to 20 mg at home.  He will take 2 of the 10 mg tablets we will follow-up Friday.  He is currently asymptomatic from his blood pressure being high.  I did advise him to take the extra 10 mg of  lisinopril tonight tonight as I expect the clonidine will wear off by this evening      Relevant Medications   rosuvastatin (CRESTOR) 5 MG tablet   lisinopril (ZESTRIL) 20 MG tablet      Note: This dictation was prepared with Dragon dictation along with smaller phrase technology. Any transcriptional errors that result from this process are unintentional.

## 2018-09-19 NOTE — Progress Notes (Signed)
Patient seen in office to have BP monitor checked.   Monitor placed on patient R arm. Noted results as 207/112, HR 74. Manually checked BP in L arm. Noted results as 210/100.  MD made aware. NO obtained to give Clonidine 0.1mg . Administered medication at 12:10pm.  Repeat BP readings as follows: 12:20- 202/100 12:30- 170/90

## 2018-09-19 NOTE — Telephone Encounter (Signed)
Call placed to patient.   Unable to obtain readings as he and wife felt machine was not working properly.  Advised to come to office for BP check.

## 2018-09-22 ENCOUNTER — Telehealth: Payer: Self-pay | Admitting: *Deleted

## 2018-09-22 MED ORDER — LISINOPRIL 40 MG PO TABS: 40.0000 mg | ORAL_TABLET | Freq: Every day | ORAL | 0 refills | Status: DC

## 2018-09-22 NOTE — Telephone Encounter (Signed)
Call placed to patient and patient made aware.   Appointment scheduled.  

## 2018-09-22 NOTE — Telephone Encounter (Signed)
Call placed to patient.   Reports that BP readings are as follows:  BP HR  28-Jul 179/104 68  29-Jul 167/93 76  30-Jul 181/93 79  31-Jul 181/98 74   Reports that he is taking Lisinopril 20mg  PO Q AM as prescribed. Patient wife, Corene verified that he is taking his medications.   MD to be made aware.

## 2018-09-22 NOTE — Telephone Encounter (Signed)
BP is still too high, Increase lisinopril to 40mg  once a day, he can take 2 of them He should have appt next week

## 2018-09-22 NOTE — Telephone Encounter (Signed)
-----   Message from Alycia Rossetti, MD sent at 09/22/2018  9:32 AM EDT ----- Regarding: Call check on BP should be on lisinopril 20mg

## 2018-09-28 ENCOUNTER — Other Ambulatory Visit: Payer: Self-pay

## 2018-09-29 ENCOUNTER — Encounter: Payer: Self-pay | Admitting: Family Medicine

## 2018-09-29 ENCOUNTER — Ambulatory Visit (INDEPENDENT_AMBULATORY_CARE_PROVIDER_SITE_OTHER): Payer: Medicare Other | Admitting: Family Medicine

## 2018-09-29 VITALS — BP 188/98 | HR 80 | Temp 98.4°F | Resp 16 | Ht 69.5 in | Wt 171.0 lb

## 2018-09-29 DIAGNOSIS — J0100 Acute maxillary sinusitis, unspecified: Secondary | ICD-10-CM | POA: Diagnosis not present

## 2018-09-29 DIAGNOSIS — I10 Essential (primary) hypertension: Secondary | ICD-10-CM | POA: Diagnosis not present

## 2018-09-29 MED ORDER — AZITHROMYCIN 250 MG PO TABS: ORAL_TABLET | ORAL | 0 refills | Status: DC

## 2018-09-29 MED ORDER — LORATADINE 10 MG PO TABS: 10.0000 mg | ORAL_TABLET | Freq: Every day | ORAL | 0 refills | Status: DC

## 2018-09-29 MED ORDER — AMLODIPINE BESYLATE 5 MG PO TABS: 5.0000 mg | ORAL_TABLET | Freq: Every day | ORAL | 3 refills | Status: DC

## 2018-09-29 MED ORDER — METHYLPREDNISOLONE ACETATE 40 MG/ML IJ SUSP
20.0000 mg | Freq: Once | INTRAMUSCULAR | Status: AC
  Administered 2018-09-29: 20 mg via INTRAMUSCULAR

## 2018-09-29 NOTE — Assessment & Plan Note (Signed)
Add norvasc 5mg  to lisiopril 40mg  He does not want diurietic if possible Recheck renalfunction with recent increase in ACEI Otherwise no symptoms

## 2018-09-29 NOTE — Patient Instructions (Addendum)
Start norvasc 5mg  once a day for blood pressure, KEEP Taking lisinopril  Take allergy pill for 2 weeks  Antibiotics sent Continue the saline rinse  Steroid shot given  Call in 2 weeks with your readings

## 2018-09-29 NOTE — Progress Notes (Signed)
   Subjective:    Patient ID: Ryan Mosley, male    DOB: March 02, 1940, 78 y.o.   MRN: [id number]  Patient presents for Follow-up (BP) and Nasal Congestion  HTN- pt here for intermin F/U on BP. I increased lisinopril over past couple of weeks to 40mg  once a day now, BP still running high, home readings 170-190/80-90's, HR 80-90  no current symptoms  Continues to have nasal congestion/sinus pressure, no fever, feels only thing that helps is a shot and antibiotisc. Has used nasal saline, OTC allergy meds no improvement         Review Of Systems:  GEN- denies fatigue, fever, weight loss,weakness, recent illness HEENT- denies eye drainage, change in vision,= nasal discharge, CVS- denies chest pain, palpitations RESP- denies SOB, cough, wheeze ABD- denies N/V, change in stools, abd pain Neuro- denies headache, dizziness, syncope, seizure activity       Objective:    BP (!) 188/98 (BP Location: Right Arm, Patient Position: Sitting, Cuff Size: Normal)   Pulse 80   Temp 98.4 F (36.9 C) (Oral)   Resp 16   Ht 5' 9.5" (1.765 m)   Wt 171 lb (77.6 kg)   SpO2 98%   BMI 24.89 kg/m  GEN- NAD, alert and oriented x3 HEENT- PERRL, EOMI, non injected sclera, pink conjunctiva, MMM, oropharynx clear, nasal congestion, no maxillary tenderness Neck- Supple, no LAD  CVS- RRR, no murmur RESP-CTAB EXT- No edema Pulses- Radial 2+        Assessment & Plan:      Problem List Items Addressed This Visit      Unprioritized   Hypertension - Primary    Add norvasc 5mg  to lisiopril 40mg  He does not want diurietic if possible Recheck renalfunction with recent increase in ACEI Otherwise no symptoms      Relevant Medications   amLODipine (NORVASC) 5 MG tablet   Other Relevant Orders   Basic metabolic panel    Other Visit Diagnoses    Acute maxillary sinusitis, recurrence not specified       Will try zpak, steroid shot based on history, only given 20mg  depo medrol due to HTN, contiue nasal  rinse and start claritin for 2 weeks, not improved send to allergy or ENT   Relevant Medications   azithromycin (ZITHROMAX) 250 MG tablet   loratadine (CLARITIN) 10 MG tablet      Note: This dictation was prepared with Dragon dictation along with smaller phrase technology. Any transcriptional errors that result from this process are unintentional.

## 2018-09-29 NOTE — Addendum Note (Signed)
Addended by: Sheral Flow on: 09/29/2018 09:35 AM   Modules accepted: Orders

## 2018-09-30 LAB — BASIC METABOLIC PANEL
BUN: 11 mg/dL (ref 7–25)
CO2: 25 mmol/L (ref 20–32)
Calcium: 9.6 mg/dL (ref 8.6–10.3)
Chloride: 104 mmol/L (ref 98–110)
Creat: 0.85 mg/dL (ref 0.70–1.18)
Glucose, Bld: 119 mg/dL — ABNORMAL HIGH (ref 65–99)
Potassium: 4 mmol/L (ref 3.5–5.3)
Sodium: 142 mmol/L (ref 135–146)

## 2018-10-04 ENCOUNTER — Telehealth: Payer: Self-pay | Admitting: *Deleted

## 2018-10-04 NOTE — Telephone Encounter (Signed)
Call placed to patient with lab results and patient made aware.   Inquired as to recent BP readings. Reports readings are as follows:  AM BP AM HR PM BP PM HR  8-Aug 163/92 83 146/91 78  9-Aug 158/93 74    10-Aug 156/83       Patient reports that he is not going to check hi BP more than once every other day as that is too much of a hassle. Reports that he is taking Amlodipine 5mg  and Lisinopril 40mg .   MD please advise.

## 2018-10-04 NOTE — Telephone Encounter (Signed)
Bp much better, no further changes He can check it three times a week

## 2018-10-04 NOTE — Telephone Encounter (Signed)
Call placed to patient and patient made aware.  

## 2018-10-10 ENCOUNTER — Ambulatory Visit: Payer: Medicare Other

## 2018-10-10 ENCOUNTER — Ambulatory Visit: Payer: Medicare Other | Admitting: Family Medicine

## 2018-10-13 ENCOUNTER — Telehealth: Payer: Self-pay | Admitting: *Deleted

## 2018-10-13 NOTE — Telephone Encounter (Signed)
Received call from patient wife, Corene.   Reports that BP readings are as follows:  BP HR  12-Aug 153/91 84  15-Aug 141/84 79  17-Aug 164/94 77  19-Aug 125/75 83  21-Aug 157/76 74   Reports that patient continues Amlodipine 5mg  PO QD and Lisinopril 40mg  PO QD.   MD please advise.

## 2018-10-13 NOTE — Telephone Encounter (Signed)
Line busy

## 2018-10-13 NOTE — Telephone Encounter (Signed)
Keep taking same dose Just monitor at home Call if BP stays above  160/90

## 2018-10-16 ENCOUNTER — Other Ambulatory Visit: Payer: Self-pay | Admitting: Family Medicine

## 2018-10-16 NOTE — Telephone Encounter (Signed)
Call placed to patient and patient wife Corene made aware.

## 2018-10-21 ENCOUNTER — Other Ambulatory Visit: Payer: Self-pay | Admitting: Family Medicine

## 2018-11-06 DIAGNOSIS — H401133 Primary open-angle glaucoma, bilateral, severe stage: Secondary | ICD-10-CM | POA: Diagnosis not present

## 2018-11-12 ENCOUNTER — Other Ambulatory Visit: Payer: Self-pay | Admitting: Family Medicine

## 2018-11-21 ENCOUNTER — Other Ambulatory Visit: Payer: Self-pay | Admitting: Family Medicine

## 2018-12-03 ENCOUNTER — Other Ambulatory Visit: Payer: Self-pay | Admitting: Family Medicine

## 2018-12-07 ENCOUNTER — Other Ambulatory Visit: Payer: Self-pay | Admitting: Family Medicine

## 2018-12-18 ENCOUNTER — Other Ambulatory Visit: Payer: Self-pay | Admitting: Family Medicine

## 2018-12-21 ENCOUNTER — Other Ambulatory Visit: Payer: Self-pay | Admitting: Family Medicine

## 2018-12-31 ENCOUNTER — Other Ambulatory Visit: Payer: Self-pay | Admitting: Family Medicine

## 2019-01-28 ENCOUNTER — Other Ambulatory Visit: Payer: Self-pay | Admitting: Family Medicine

## 2019-02-05 DIAGNOSIS — H401133 Primary open-angle glaucoma, bilateral, severe stage: Secondary | ICD-10-CM | POA: Diagnosis not present

## 2019-03-19 ENCOUNTER — Encounter: Payer: Self-pay | Admitting: Family Medicine

## 2019-03-19 ENCOUNTER — Other Ambulatory Visit: Payer: Self-pay

## 2019-03-19 ENCOUNTER — Ambulatory Visit (INDEPENDENT_AMBULATORY_CARE_PROVIDER_SITE_OTHER): Payer: Medicare Other | Admitting: Family Medicine

## 2019-03-19 VITALS — BP 144/90 | HR 80 | Temp 97.8°F | Resp 16 | Ht 69.5 in | Wt 171.1 lb

## 2019-03-19 DIAGNOSIS — H401130 Primary open-angle glaucoma, bilateral, stage unspecified: Secondary | ICD-10-CM | POA: Insufficient documentation

## 2019-03-19 DIAGNOSIS — I10 Essential (primary) hypertension: Secondary | ICD-10-CM

## 2019-03-19 DIAGNOSIS — Z0000 Encounter for general adult medical examination without abnormal findings: Secondary | ICD-10-CM | POA: Diagnosis not present

## 2019-03-19 DIAGNOSIS — E782 Mixed hyperlipidemia: Secondary | ICD-10-CM | POA: Diagnosis not present

## 2019-03-19 DIAGNOSIS — E119 Type 2 diabetes mellitus without complications: Secondary | ICD-10-CM

## 2019-03-19 DIAGNOSIS — J309 Allergic rhinitis, unspecified: Secondary | ICD-10-CM | POA: Diagnosis not present

## 2019-03-19 DIAGNOSIS — H401132 Primary open-angle glaucoma, bilateral, moderate stage: Secondary | ICD-10-CM

## 2019-03-19 MED ORDER — AMLODIPINE BESYLATE 5 MG PO TABS: ORAL_TABLET | ORAL | 2 refills | Status: DC

## 2019-03-19 MED ORDER — ROSUVASTATIN CALCIUM 5 MG PO TABS: ORAL_TABLET | ORAL | 2 refills | Status: DC

## 2019-03-19 MED ORDER — LISINOPRIL 40 MG PO TABS: 40.0000 mg | ORAL_TABLET | Freq: Every day | ORAL | 2 refills | Status: DC

## 2019-03-19 NOTE — Patient Instructions (Signed)
F/U 6 months  We will call with lab results  

## 2019-03-19 NOTE — Progress Notes (Signed)
Subjective:   Patient presents for Medicare Annual/Subsequent preventive examination.  Patient is on complaints is chronic allergic rhinitis and congestion.  States that he refused to take any more allergy meds or use any more sprays.  He only wants shots and antibiotic.  He denies any facial pressure no change to his drainage no fever no cough no ear pain.  Hypertension states his blood pressure is running good at home he has not taken his medication today he is on the lisinopril 40 mg and amlodipine 5 mg.  Hyperlipidemia  he is taking Crestor 5 mg once a week   He is followed by Dr. Jearld Fenton   He is currently on drops for glaucoma  Review Past Medical/Family/Social: Per EMR   Risk Factors  Current exercise habits: Stays active  dietary issues discussed: No major concerns  Cardiac risk factors: Hypertension, hyperlipidemia, cardiac arrhythmia  Depression Screen  (Note: if answer to either of the following is "Yes", a more complete depression screening is indicated)  Over the past two weeks, have you felt down, depressed or hopeless? No Over the past two weeks, have you felt little interest or pleasure in doing things? No Have you lost interest or pleasure in daily life? No Do you often feel hopeless? No Do you cry easily over simple problems? No   Activities of Daily Living  In your present state of health, do you have any difficulty performing the following activities?:  Driving? No  Managing money? No  Feeding yourself? No  Getting from bed to chair? No  Climbing a flight of stairs? No  Preparing food and eating?: No  Bathing or showering? No  Getting dressed: No  Getting to the toilet? No  Using the toilet:No  Moving around from place to place: No  In the past year have you fallen or had a near fall?:No  Are you sexually active? No  Do you have more than one partner? No   Hearing Difficulties: No  Do you often ask people to speak up or repeat themselves? No  Do you  experience ringing or noises in your ears? No Do you have difficulty understanding soft or whispered voices? No  Do you feel that you have a problem with memory? No Do you often misplace items? No  Do you feel safe at home? Yes  Cognitive Testing  Alert? Yes Normal Appearance?Yes  Oriented to person? Yes Place? Yes  Time? Yes  Recall of three objects? Yes  Can perform simple calculations? Yes  Displays appropriate judgment?Yes  Can read the correct time from a watch face?Yes   List the Names of Other Physician/Practitioners you currently use:   Dr. Gershon Crane    Screening Tests / Date Colonoscopy over age                     Influenza Vaccine UTD Tetanus/tdap/shingles/PNA- DECLINES   ROS:  GEN- denies fatigue, fever, weight loss,weakness, recent illness HEENT- denies eye drainage, change in vision, nasal discharge, CVS- denies chest pain, palpitations RESP- denies SOB, cough, wheeze ABD- denies N/V, change in stools, abd pain GU- denies dysuria, hematuria, dribbling, incontinence MSK- denies joint pain, muscle aches, injury Neuro- denies headache, dizziness, syncope, seizure activity  Physical: GEN- NAD, alert and oriented x3,vitals reviewed  HEENT- PERRL, EOMI, non injected sclera, pink conjunctiva, MMM, oropharynx clear, nasal congestion, no sinus tenderness, TM clear no effusion  Neck- Supple, no thryomegaly CVS- RRR, no murmur RESP-CTAB ABD-NABS,soft,NT,ND  EXT- No edema Pulses- Radial,  DP- 2+   Assessment:    Annual wellness medicare exam   Plan:    During the course of the visit the patient was educated and counseled about appropriate screening and preventive services including:   CPE Done. He declines further immunizations.  Hypertension mildly elevated blood pressure today he is not taking his meds.  No change in medications he continues to monitor at home.  We will check his renal function.  Hyperlipidemia continue Crestor weekly check lipid  panel  Diabetes mellitus diet-controlled we will recheck A1c last was at goal 6.3% in the summer 2020  Chronic nasal congestion rhinitis given Depo-Medrol 40 mg IM advised he does not need antibiotics.  He declined seeing ENT does not want to try any other medications for his sinus issues.  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       Diet review for nutrition referral? Yes ____ Not Indicated __x__  Patient Instructions (the written plan) was given to the patient.  Medicare Attestation  I have personally reviewed:  The patient's medical and social history  Their use of alcohol, tobacco or illicit drugs  Their current medications and supplements  The patient's functional ability including ADLs,fall risks, home safety risks, cognitive, and hearing and visual impairment  Diet and physical activities  Evidence for depression or mood disorders  The patient's weight, height, BMI, and visual acuity have been recorded in the chart. I have made referrals, counseling, and provided education to the patient based on review of the above and I have provided the patient with a written personalized care plan for preventive services.

## 2019-03-20 LAB — COMPREHENSIVE METABOLIC PANEL
AG Ratio: 1.5 (calc) (ref 1.0–2.5)
ALT: 25 U/L (ref 9–46)
AST: 21 U/L (ref 10–35)
Albumin: 4.8 g/dL (ref 3.6–5.1)
Alkaline phosphatase (APISO): 91 U/L (ref 35–144)
BUN: 11 mg/dL (ref 7–25)
CO2: 26 mmol/L (ref 20–32)
Calcium: 9.7 mg/dL (ref 8.6–10.3)
Chloride: 102 mmol/L (ref 98–110)
Creat: 0.8 mg/dL (ref 0.70–1.18)
Globulin: 3.2 g/dL (calc) (ref 1.9–3.7)
Glucose, Bld: 127 mg/dL — ABNORMAL HIGH (ref 65–99)
Potassium: 3.8 mmol/L (ref 3.5–5.3)
Sodium: 141 mmol/L (ref 135–146)
Total Bilirubin: 0.6 mg/dL (ref 0.2–1.2)
Total Protein: 8 g/dL (ref 6.1–8.1)

## 2019-03-20 LAB — HEMOGLOBIN A1C
Hgb A1c MFr Bld: 6.6 % of total Hgb — ABNORMAL HIGH (ref <5.7)
Mean Plasma Glucose: 143 (calc)
eAG (mmol/L): 7.9 (calc)

## 2019-03-20 LAB — CBC WITH DIFFERENTIAL/PLATELET
Absolute Monocytes: 420 cells/uL (ref 200–950)
Basophils Absolute: 30 cells/uL (ref 0–200)
Basophils Relative: 0.5 %
Eosinophils Absolute: 102 cells/uL (ref 15–500)
Eosinophils Relative: 1.7 %
HCT: 49 % (ref 38.5–50.0)
Hemoglobin: 16.1 g/dL (ref 13.2–17.1)
Lymphs Abs: 1932 cells/uL (ref 850–3900)
MCH: 30.3 pg (ref 27.0–33.0)
MCHC: 32.9 g/dL (ref 32.0–36.0)
MCV: 92.3 fL (ref 80.0–100.0)
MPV: 10.2 fL (ref 7.5–12.5)
Monocytes Relative: 7 %
Neutro Abs: 3516 cells/uL (ref 1500–7800)
Neutrophils Relative %: 58.6 %
Platelets: 215 10*3/uL (ref 140–400)
RBC: 5.31 10*6/uL (ref 4.20–5.80)
RDW: 12 % (ref 11.0–15.0)
Total Lymphocyte: 32.2 %
WBC: 6 10*3/uL (ref 3.8–10.8)

## 2019-03-20 LAB — LIPID PANEL
Cholesterol: 233 mg/dL — ABNORMAL HIGH (ref <200)
HDL: 44 mg/dL (ref >=40)
LDL Cholesterol (Calc): 168 mg/dL (calc) — ABNORMAL HIGH
Non-HDL Cholesterol (Calc): 189 mg/dL (calc) — ABNORMAL HIGH (ref <130)
Total CHOL/HDL Ratio: 5.3 (calc) — ABNORMAL HIGH (ref <5.0)
Triglycerides: 99 mg/dL (ref <150)

## 2019-03-22 ENCOUNTER — Other Ambulatory Visit: Payer: Self-pay | Admitting: *Deleted

## 2019-03-22 MED ORDER — ROSUVASTATIN CALCIUM 5 MG PO TABS: ORAL_TABLET | ORAL | 2 refills | Status: DC

## 2019-05-07 DIAGNOSIS — Z961 Presence of intraocular lens: Secondary | ICD-10-CM | POA: Diagnosis not present

## 2019-05-07 DIAGNOSIS — H401133 Primary open-angle glaucoma, bilateral, severe stage: Secondary | ICD-10-CM | POA: Diagnosis not present

## 2019-05-07 DIAGNOSIS — E119 Type 2 diabetes mellitus without complications: Secondary | ICD-10-CM | POA: Diagnosis not present

## 2019-05-07 DIAGNOSIS — H547 Unspecified visual loss: Secondary | ICD-10-CM | POA: Diagnosis not present

## 2019-05-07 DIAGNOSIS — H26492 Other secondary cataract, left eye: Secondary | ICD-10-CM | POA: Diagnosis not present

## 2019-05-09 DIAGNOSIS — H26492 Other secondary cataract, left eye: Secondary | ICD-10-CM | POA: Diagnosis not present

## 2019-07-10 ENCOUNTER — Telehealth: Payer: Self-pay | Admitting: Family Medicine

## 2019-07-10 NOTE — Progress Notes (Signed)
°  Chronic Care Management   Outreach Note  07/10/2019 Name: Ryan Mosley MRN: [id number] DOB: 1940-05-22  Referred by: Alycia Rossetti, MD Reason for referral : No chief complaint on file.   A second unsuccessful telephone outreach was attempted today. The patient was referred to pharmacist for assistance with care management and care coordination.  Follow Up Plan:   Prathima Ghanta [employer] Scheduler

## 2019-07-10 NOTE — Chronic Care Management (AMB) (Signed)
°  Chronic Care Management   Note  07/10/2019 Name: Shaye Chustz MRN: [id number] DOB: 1940-03-15  Hartley Hisle is a 79 y.o. year old male who is a primary care patient of [place], Modena Nunnery, MD. I reached out to [name] by phone today in response to a referral sent by Mr. Carle Petrosyan's PCP, Buelah Manis, Modena Nunnery, MD.   Mr. Buchmann was given information about Chronic Care Management services today including:  1. CCM service includes personalized support from designated clinical staff supervised by his physician, including individualized plan of care and coordination with other care providers 2. 24/7 contact phone numbers for assistance for urgent and routine care needs. 3. Service will only be billed when office clinical staff spend 20 minutes or more in a month to coordinate care. 4. Only one practitioner may furnish and bill the service in a calendar month. 5. The patient may stop CCM services at any time (effective at the end of the month) by phone call to the office staff.   Patient agreed to services and verbal consent obtained.   Follow up plan:   [place]

## 2019-07-24 ENCOUNTER — Other Ambulatory Visit: Payer: Self-pay | Admitting: Family Medicine

## 2019-08-20 DIAGNOSIS — H401133 Primary open-angle glaucoma, bilateral, severe stage: Secondary | ICD-10-CM | POA: Diagnosis not present

## 2019-08-30 ENCOUNTER — Other Ambulatory Visit: Payer: Self-pay | Admitting: *Deleted

## 2019-08-30 DIAGNOSIS — E782 Mixed hyperlipidemia: Secondary | ICD-10-CM

## 2019-08-30 DIAGNOSIS — E119 Type 2 diabetes mellitus without complications: Secondary | ICD-10-CM

## 2019-08-30 DIAGNOSIS — I10 Essential (primary) hypertension: Secondary | ICD-10-CM

## 2019-08-30 NOTE — Chronic Care Management (AMB) (Signed)
Chronic Care Management Pharmacy  Name: Ryan Mosley  MRN: [id number] DOB: [birth year]  Chief Complaint/ HPI  Ryan Mosley,  79 y.o. , male presents for their Initial CCM visit with the clinical pharmacist via telephone.  PCP : Alycia Rossetti, MD  Their chronic conditions include: hypertension, allergic rhinitis, hyperlipidemia, diabetes mellitus.  Office Visits:   03/19/2019 [facility]) - BP was slightly elevated however he is not taking his medications, on crestor once weekly, cholesterol elevated at lab draw this visit and increased to twice weekly  Consult Visit:  08/20/2019 [facility] Crane, Ophthalmology) - primary open angle glaucoma, no med changes continue Latanoprost one drop in each eye nightly  Medications: Outpatient Encounter Medications as of 09/03/2019  Medication Sig  . amLODipine (NORVASC) 5 MG tablet TAKE 1 TABLET BY MOUTH EVERY DAY FOR BLOOD PRESSURE  . latanoprost (XALATAN) 0.005 % ophthalmic solution INSTILL 1 DROP INTO BOTH EYES EVERY DAY AT NIGHT  . lisinopril (ZESTRIL) 40 MG tablet Take 1 tablet (40 mg total) by mouth daily.  . rosuvastatin (CRESTOR) 5 MG tablet TAKE 1 TABLET 2 TIMES WEEKLY FOR CHOLESTEROL  . loratadine (CLARITIN) 10 MG tablet TAKE 1 TABLET BY MOUTH EVERY DAY (Patient not taking: Reported on 09/03/2019)  . sodium chloride (OCEAN) 0.65 % SOLN nasal spray Place 2 sprays into both nostrils as needed for congestion. (Patient not taking: Reported on 09/03/2019)   No facility-administered encounter medications on file as of 09/03/2019.     Current Diagnosis/Assessment:   Emergency planning/management officer Strain: Low Risk   . Difficulty of Paying Living Expenses: Not very hard    Goals Addressed            This Visit's Progress   . Pharmacy Care Plan:       CARE PLAN ENTRY (see longitudinal plan of care for additional care plan information)  Current Barriers:  . Chronic Disease Management support, education, and care coordination needs related to  Hypertension, Hyperlipidemia, Diabetes, and allergic rhinitis.   Hypertension BP Readings from Last 3 Encounters:  03/19/19 (!) 144/90  09/29/18 (!) 188/98  09/19/18 (!) 210/102   . Pharmacist Clinical Goal(s): o Over the next 180 days, patient will work with PharmD and providers to achieve BP goal <130/80 . Current regimen:  o Amlodipine 5mg  daily o Lisinopril 40mg  daily . Interventions: o Reviewed home blood pressure monitoring o Counseled on adherence . Patient self care activities - Over the next 180 days, patient will: o Check BP periodically, document, and provide at future appointments o Ensure daily salt intake < 2300 mg/day o Contact providers if consistently > 140/90  Hyperlipidemia Lab Results  Component Value Date/Time   LDLCALC 168 (H) 03/19/2019 09:03 AM   . Pharmacist Clinical Goal(s): o Over the next 180 days, patient will work with PharmD and providers to achieve LDL goal < 100 . Current regimen:  o Rosuvastatin 5mg  twice weekly . Interventions: o Counseled on adherence and importance of statin medications o Evaluated for adverse effects . Patient self care activities - Over the next 180 days, patient will: o Continue to focus on medication adherence by pill count o Reports to PCP for follow up and updated lipid panel  Diabetes Lab Results  Component Value Date/Time   HGBA1C 6.6 (H) 03/19/2019 09:03 AM   HGBA1C 6.3 (H) 09/11/2018 09:08 AM   . Pharmacist Clinical Goal(s): o Over the next 180 days, patient will work with PharmD and providers to maintain A1c goal <7% . Current regimen:  o No  medications . Interventions: o Reviewed home blood sugar monitoring o Evaluated for episodes of hypoglycemia o Discussed diet as it related to blood sugar control . Patient self care activities - Over the next 180 days, patient will: o Check blood sugar once daily, document, and provide at future appointments o Contact provider with any episodes of hypoglycemia  (less than 70)  Allergic Rhinitis . Pharmacist Clinical Goal(s) o Over the next 180 days, patient will work with PharmD and providers to optimize medication related to allergic rhinitis . Current regimen:  o Currently not taking any medications for allergies . Interventions: o Discussed symptom frequency and type  o Discussed other potential OTC options . Patient self care activities - Over the next 180 days, patient will: o Report to PCP for follow up to discuss alternative options o Contact providers with any increase or change in symptoms  Initial goal documentation        Diabetes   Recent Relevant Labs: Lab Results  Component Value Date/Time   HGBA1C 6.6 (H) 03/19/2019 09:03 AM   HGBA1C 6.3 (H) 09/11/2018 09:08 AM     Checking BG: Daily  Recent FBG Readings: 115-135   Patient has failed these meds in past: none noted Patient is currently controlled on the following medications: none noted at this time  Last diabetic Foot exam: No results found for: HMDIABEYEEXA  Last diabetic Eye exam: No results found for: HMDIABFOOTEX   We discussed: Patient reports consistent blood sugars fasting in above range.  Not eating any specific diet but is watching portion sizes when it comes to carbohydrate or sugar containing foods.  Last A1c 6.6 - not currently taking any medication.  Reports no low blood sugars and no hyperglycemia.  Plan  Continue control with diet and exercise recheck A1c at upcoming PCP visit.  If trending up still need to really focus on diet/lifestyle.  Hypertension     Office blood pressures are  BP Readings from Last 3 Encounters:  03/19/19 (!) 144/90  09/29/18 (!) 188/98  09/19/18 (!) 210/102    Patient has failed these meds in the past: losartan 50mg   Patient checks BP at home infrequently  Patient home BP readings are ranging: no logs available  Patient is currently uncontrolled based on office BPs on the following medications:   Amlodipine  5mg  daily  Lisinopril 40mg  daily  Not currently checking blood pressure at home as he is too busy and has too much going on.  Does have access to cuff at home.  No swelling reported, denies chest pain/dizziness.  Reports adherence to medication.    Plan  Continue current medications, recheck blood pressure in office, if upward trend continues recommend titration of amlodipine to 10mg  daily and monitor  BP at home daily for one to two weeks.    Hyperlipidemia   LDL goal < 100  Lipid Panel     Component Value Date/Time   CHOL 233 (H) 03/19/2019 0903   TRIG 99 03/19/2019 0903   HDL 44 03/19/2019 0903   LDLCALC 168 (H) 03/19/2019 0903    Hepatic Function Latest Ref Rng & Units 03/19/2019 09/11/2018 03/13/2018  Total Protein 6.1 - 8.1 g/dL 8.0 7.6 7.2  AST 10 - 35 U/L 21 18 16   ALT 9 - 46 U/L 25 22 18   Total Bilirubin 0.2 - 1.2 mg/dL 0.6 0.6 0.7     The 10-year ASCVD risk score Mikey Bussing DC Jr., et al., 2013) is: 51.1%   Values used to calculate  the score:     Age: 72 years     Sex: Male     Is Non-Hispanic African American: Yes     Diabetic: Yes     Tobacco smoker: No     Systolic Blood Pressure: 549 mmHg     Is BP treated: Yes     HDL Cholesterol: 44 mg/dL     Total Cholesterol: 233 mg/dL   Patient has failed these meds in past: none noted Patient is currently uncontrolled on the following medications:  . Rosuvastatin 5mg  twice weekly  Currently tolerating twice weekly dose.  Reports no myalgias.  Patient did report when he took it daily it gave him trouble in his legs.  Pharmacy filled last Rx as once weekly dose, however he is still taking it twice weekly as directed.  Counseled patient on importance of statins on cardio protection.  Plan  Continue current medications, recommend recheck lipids at upcoming visit, if still elevated see if he would be willing to increase to three times weekly.  Allergic Rhinitis   Patient has failed these meds in past: none noted Patient  is currently uncontrolled on the following medications:  . Loratadine 10mg  daily . [place] 0.65% nasal soln  Patient reports nothing he takes for allergies works.  Reports in the past he has gotten a "shot" and a pack of ABX that knocks it out.  Has tried all OTC options per patient.  Says this happens about twice a year during season changes and nothing else has worked.  No longer taking medications above.  Plan  Discuss other alternatives with Dr. Buelah Manis at upcoming follow up visit.  Vaccines   Reviewed and discussed patient's vaccination history.    Immunization History  Administered Date(s) Administered  . Influenza, High Dose Seasonal PF 11/21/2016, 12/20/2017, 11/15/2018    Plan  Recommended shingles, patient declines. Reports he is current on pneumonia.   Medication Management   . Miscellaneous medications: latanoprost 0.005% eye drops . OTC's: none . Patient currently uses [facility].  Phone #  [phone] . Patient reports using no specific method to organize medications and promote adherence. . Patient denies missed doses of medication.   Beverly Milch, PharmD Clinical Pharmacist [place]  [phone]

## 2019-09-03 ENCOUNTER — Other Ambulatory Visit: Payer: Self-pay

## 2019-09-03 ENCOUNTER — Ambulatory Visit: Payer: Medicare Other | Admitting: Pharmacist

## 2019-09-03 DIAGNOSIS — J309 Allergic rhinitis, unspecified: Secondary | ICD-10-CM

## 2019-09-03 DIAGNOSIS — E119 Type 2 diabetes mellitus without complications: Secondary | ICD-10-CM

## 2019-09-03 DIAGNOSIS — I10 Essential (primary) hypertension: Secondary | ICD-10-CM

## 2019-09-03 DIAGNOSIS — E782 Mixed hyperlipidemia: Secondary | ICD-10-CM

## 2019-09-03 NOTE — Patient Instructions (Addendum)
Visit Information Thank you for meeting with me today!  I look forward to working with you to help you meet all of your healthcare goals and answer any questions you may have.  Feel free to contact me anytime!  Goals Addressed            This Visit's Progress   . Pharmacy Care Plan:       CARE PLAN ENTRY (see longitudinal plan of care for additional care plan information)  Current Barriers:  . Chronic Disease Management support, education, and care coordination needs related to Hypertension, Hyperlipidemia, Diabetes, and allergic rhinitis.   Hypertension BP Readings from Last 3 Encounters:  03/19/19 (!) 144/90  09/29/18 (!) 188/98  09/19/18 (!) 210/102   . Pharmacist Clinical Goal(s): o Over the next 180 days, patient will work with PharmD and providers to achieve BP goal <130/80 . Current regimen:  o Amlodipine 5mg  daily o Lisinopril 40mg  daily . Interventions: o Reviewed home blood pressure monitoring o Counseled on adherence . Patient self care activities - Over the next 180 days, patient will: o Check BP periodically, document, and provide at future appointments o Ensure daily salt intake < 2300 mg/day o Contact providers if consistently > 140/90  Hyperlipidemia Lab Results  Component Value Date/Time   LDLCALC 168 (H) 03/19/2019 09:03 AM   . Pharmacist Clinical Goal(s): o Over the next 180 days, patient will work with PharmD and providers to achieve LDL goal < 100 . Current regimen:  o Rosuvastatin 5mg  twice weekly . Interventions: o Counseled on adherence and importance of statin medications o Evaluated for adverse effects . Patient self care activities - Over the next 180 days, patient will: o Continue to focus on medication adherence by pill count o Reports to PCP for follow up and updated lipid panel  Diabetes Lab Results  Component Value Date/Time   HGBA1C 6.6 (H) 03/19/2019 09:03 AM   HGBA1C 6.3 (H) 09/11/2018 09:08 AM   . Pharmacist Clinical  Goal(s): o Over the next 180 days, patient will work with PharmD and providers to maintain A1c goal <7% . Current regimen:  o No medications . Interventions: o Reviewed home blood sugar monitoring o Evaluated for episodes of hypoglycemia o Discussed diet as it related to blood sugar control . Patient self care activities - Over the next 180 days, patient will: o Check blood sugar once daily, document, and provide at future appointments o Contact provider with any episodes of hypoglycemia (less than 70)  Allergic Rhinitis . Pharmacist Clinical Goal(s) o Over the next 180 days, patient will work with PharmD and providers to optimize medication related to allergic rhinitis . Current regimen:  o Currently not taking any medications for allergies . Interventions: o Discussed symptom frequency and type  o Discussed other potential OTC options . Patient self care activities - Over the next 180 days, patient will: o Report to PCP for follow up to discuss alternative options o Contact providers with any increase or change in symptoms  Initial goal documentation        Ryan Mosley was given information about Chronic Care Management services today including:  1. CCM service includes personalized support from designated clinical staff supervised by his physician, including individualized plan of care and coordination with other care providers 2. 24/7 contact phone numbers for assistance for urgent and routine care needs. 3. Standard insurance, coinsurance, copays and deductibles apply for chronic care management only during months in which we provide at least 20 minutes  of these services. Most insurances cover these services at 100%, however patients may be responsible for any copay, coinsurance and/or deductible if applicable. This service may help you avoid the need for more expensive face-to-face services. 4. Only one practitioner may furnish and bill the service in a calendar month. 5. The  patient may stop CCM services at any time (effective at the end of the month) by phone call to the office staff.  Patient agreed to services and verbal consent obtained.   The patient verbalized understanding of instructions provided today and agreed to receive a mailed copy of patient instruction and/or educational materials. Telephone follow up appointment with pharmacy team member scheduled for:6 months Beverly Milch, PharmD Clinical Pharmacist [facility] [phone]   High Cholesterol  High cholesterol is a condition in which the blood has high levels of a white, waxy, fat-like substance (cholesterol). The human body needs small amounts of cholesterol. The liver makes all the cholesterol that the body needs. Extra (excess) cholesterol comes from the food that we eat. Cholesterol is carried from the liver by the blood through the blood vessels. If you have high cholesterol, deposits (plaques) may build up on the walls of your blood vessels (arteries). Plaques make the arteries narrower and stiffer. Cholesterol plaques increase your risk for heart attack and stroke. Work with your health care provider to keep your cholesterol levels in a healthy range. What increases the risk? This condition is more likely to develop in people who:  Eat foods that are high in animal fat (saturated fat) or cholesterol.  Are overweight.  Are not getting enough exercise.  Have a family history of high cholesterol. What are the signs or symptoms? There are no symptoms of this condition. How is this diagnosed? This condition may be diagnosed from the results of a blood test.  If you are older than age 80, your health care provider may check your cholesterol every 4-6 years.  You may be checked more often if you already have high cholesterol or other risk factors for heart disease. The blood test for cholesterol measures:  "Bad" cholesterol (LDL cholesterol). This is the main  type of cholesterol that causes heart disease. The desired level for LDL is less than 100.  "Good" cholesterol (HDL cholesterol). This type helps to protect against heart disease by cleaning the arteries and carrying the LDL away. The desired level for HDL is 60 or higher.  Triglycerides. These are fats that the body can store or burn for energy. The desired number for triglycerides is lower than 150.  Total cholesterol. This is a measure of the total amount of cholesterol in your blood, including LDL cholesterol, HDL cholesterol, and triglycerides. A healthy number is less than 200. How is this treated? This condition is treated with diet changes, lifestyle changes, and medicines. Diet changes  This may include eating more whole grains, fruits, vegetables, nuts, and fish.  This may also include cutting back on red meat and foods that have a lot of added sugar. Lifestyle changes  Changes may include getting at least 40 minutes of aerobic exercise 3 times a week. Aerobic exercises include walking, biking, and swimming. Aerobic exercise along with a healthy diet can help you maintain a healthy weight.  Changes may also include quitting smoking. Medicines  Medicines are usually given if diet and lifestyle changes have failed to reduce your cholesterol to healthy levels.  Your health care provider may prescribe a statin medicine. Statin medicines have  been shown to reduce cholesterol, which can reduce the risk of heart disease. Follow these instructions at home: Eating and drinking If told by your health care provider:  Eat chicken (without skin), fish, veal, shellfish, ground [place] breast, and round or loin cuts of red meat.  Do not eat fried foods or fatty meats, such as hot dogs and salami.  Eat plenty of fruits, such as apples.  Eat plenty of vegetables, such as broccoli, potatoes, and carrots.  Eat beans, peas, and lentils.  Eat grains such as barley, rice, couscous, and  bulgur wheat.  Eat pasta without cream sauces.  Use skim or nonfat milk, and eat low-fat or nonfat yogurt and cheeses.  Do not eat or drink whole milk, cream, ice cream, egg yolks, or hard cheeses.  Do not eat stick margarine or tub margarines that contain trans fats (also called partially hydrogenated oils).  Do not eat saturated tropical oils, such as coconut oil and palm oil.  Do not eat cakes, cookies, crackers, or other baked goods that contain trans fats.  General instructions  Exercise as directed by your health care provider. Increase your activity level with activities such as gardening, walking, and taking the stairs.  Take over-the-counter and prescription medicines only as told by your health care provider.  Do not use any products that contain nicotine or tobacco, such as cigarettes and e-cigarettes. If you need help quitting, ask your health care provider.  Keep all follow-up visits as told by your health care provider. This is important. Contact a health care provider if:  You are struggling to maintain a healthy diet or weight.  You need help to start on an exercise program.  You need help to stop smoking. Get help right away if:  You have chest pain.  You have trouble breathing. This information is not intended to replace advice given to you by your health care provider. Make sure you discuss any questions you have with your health care provider. Document Revised: 02/11/2017 Document Reviewed: 08/09/2015 Elsevier Patient Education  [place].

## 2019-09-17 ENCOUNTER — Encounter: Payer: Self-pay | Admitting: Family Medicine

## 2019-09-17 ENCOUNTER — Ambulatory Visit (INDEPENDENT_AMBULATORY_CARE_PROVIDER_SITE_OTHER): Payer: Medicare Other | Admitting: Family Medicine

## 2019-09-17 ENCOUNTER — Other Ambulatory Visit: Payer: Self-pay

## 2019-09-17 VITALS — BP 142/80 | HR 74 | Temp 98.2°F | Resp 16 | Ht 69.5 in | Wt 170.0 lb

## 2019-09-17 DIAGNOSIS — R350 Frequency of micturition: Secondary | ICD-10-CM | POA: Diagnosis not present

## 2019-09-17 DIAGNOSIS — Z1159 Encounter for screening for other viral diseases: Secondary | ICD-10-CM | POA: Diagnosis not present

## 2019-09-17 DIAGNOSIS — I10 Essential (primary) hypertension: Secondary | ICD-10-CM

## 2019-09-17 DIAGNOSIS — H401132 Primary open-angle glaucoma, bilateral, moderate stage: Secondary | ICD-10-CM

## 2019-09-17 DIAGNOSIS — E119 Type 2 diabetes mellitus without complications: Secondary | ICD-10-CM

## 2019-09-17 DIAGNOSIS — N400 Enlarged prostate without lower urinary tract symptoms: Secondary | ICD-10-CM | POA: Insufficient documentation

## 2019-09-17 DIAGNOSIS — N401 Enlarged prostate with lower urinary tract symptoms: Secondary | ICD-10-CM

## 2019-09-17 DIAGNOSIS — E782 Mixed hyperlipidemia: Secondary | ICD-10-CM | POA: Diagnosis not present

## 2019-09-17 LAB — URINALYSIS, ROUTINE W REFLEX MICROSCOPIC
Bacteria, UA: NONE SEEN /HPF
Bilirubin Urine: NEGATIVE
Glucose, UA: NEGATIVE
Hyaline Cast: NONE SEEN /LPF
Ketones, ur: NEGATIVE
Leukocytes,Ua: NEGATIVE
Nitrite: NEGATIVE
Specific Gravity, Urine: 1.02 (ref 1.001–1.03)
WBC, UA: NONE SEEN /HPF (ref 0–5)
pH: 7 (ref 5.0–8.0)

## 2019-09-17 LAB — MICROSCOPIC MESSAGE

## 2019-09-17 NOTE — Assessment & Plan Note (Signed)
Check UA Plan to start flomax  0.4mg  at bedtime

## 2019-09-17 NOTE — Patient Instructions (Addendum)
F/U End of Jan for Physical

## 2019-09-17 NOTE — Progress Notes (Signed)
   Subjective:    Patient ID: Ryan Mosley, male    DOB: [birth year], 79 y.o.   MRN: [id number]  Patient presents for Follow-up (is fasting) and Urinary Freuency (has increased freuency at night only- no pain or discomfort)   HTN-he is taking his blood pressure medicine as prescribed.  No side effects of the medication.  States that his blood pressure has been good.   Glaucoma -no recent changes to his medication he said his last checkup was good.    Urinary frequency -he has a ongoing history of urinary frequency and mild urgency.  In the past we did discuss trying medication for BPH symptoms but he did not want to.  Now he would like to try something.  He denies any burning or pressure with urination denies any hematuria.  Denies any constipation.    DM -  Last A1C 6.6% 6 months ago.  No current medication, CBG this AM 136 he states that typically his sugars in the 120s when he checks it.    Hyperlipidemia- Taking crestor twice a week he is tolerating without any side effects.  Review Of Systems:  GEN- denies fatigue, fever, weight loss,weakness, recent illness HEENT- denies eye drainage, change in vision, nasal discharge, CVS- denies chest pain, palpitations RESP- denies SOB, cough, wheeze ABD- denies N/V, change in stools, abd pain GU- denies dysuria, hematuria,+ dribbling, incontinence MSK- denies joint pain, muscle aches, injury Neuro- denies headache, dizziness, syncope, seizure activity       Objective:    BP (!) 142/80   Pulse 74   Temp 98.2 F (36.8 C) (Temporal)   Resp 16   Ht 5' 9.5" (1.765 m)   Wt 170 lb (77.1 kg)   SpO2 96%   BMI 24.74 kg/m  GEN- NAD, alert and oriented x3 HEENT- PERRL, EOMI, non injected sclera, pink conjunctiva, MMM, oropharynx clear Neck- Supple, no thyromegaly CVS- RRR, no murmur RESP-CTAB ABD-NABS,soft,NT,ND EXT- No edema Pulses- Radial, DP- 2+        Assessment & Plan:      Problem List Items Addressed This Visit       Unprioritized   BPH (benign prostatic hyperplasia)    Check UA Plan to start flomax  0.4mg  at bedtime       Relevant Orders   Urinalysis, Routine w reflex microscopic   Diabetes mellitus without complication (HCC)    Diet controlled, goal less than 7% No current meds       Relevant Orders   CBC with Differential/Platelet   Comprehensive metabolic panel   Lipid panel   Hemoglobin A1c   Microalbumin / creatinine urine ratio   Hyperlipemia    Continue crestor twice a week, this is most pt willing to take at this time       Relevant Orders   Lipid panel   Hypertension - Primary    Controlled for pt, no changes to meds CHECK LABS today        Primary open angle glaucoma (POAG) of both eyes    Other Visit Diagnoses    Need for hepatitis C screening test       Relevant Orders   Hepatitis C antibody      Note: This dictation was prepared with Dragon dictation along with smaller phrase technology. Any transcriptional errors that result from this process are unintentional.

## 2019-09-17 NOTE — Assessment & Plan Note (Signed)
Continue crestor twice a week, this is most pt willing to take at this time

## 2019-09-17 NOTE — Assessment & Plan Note (Signed)
Diet controlled, goal less than 7% No current meds

## 2019-09-17 NOTE — Assessment & Plan Note (Signed)
Controlled for pt, no changes to meds CHECK LABS today

## 2019-09-18 LAB — CBC WITH DIFFERENTIAL/PLATELET
Absolute Monocytes: 677 cells/uL (ref 200–950)
Basophils Absolute: 50 cells/uL (ref 0–200)
Basophils Relative: 0.7 %
Eosinophils Absolute: 122 cells/uL (ref 15–500)
Eosinophils Relative: 1.7 %
HCT: 46.8 % (ref 38.5–50.0)
Hemoglobin: 15.3 g/dL (ref 13.2–17.1)
Lymphs Abs: 2009 cells/uL (ref 850–3900)
MCH: 29.9 pg (ref 27.0–33.0)
MCHC: 32.7 g/dL (ref 32.0–36.0)
MCV: 91.4 fL (ref 80.0–100.0)
MPV: 10 fL (ref 7.5–12.5)
Monocytes Relative: 9.4 %
Neutro Abs: 4342 cells/uL (ref 1500–7800)
Neutrophils Relative %: 60.3 %
Platelets: 234 10*3/uL (ref 140–400)
RBC: 5.12 10*6/uL (ref 4.20–5.80)
RDW: 12.3 % (ref 11.0–15.0)
Total Lymphocyte: 27.9 %
WBC: 7.2 10*3/uL (ref 3.8–10.8)

## 2019-09-18 LAB — COMPREHENSIVE METABOLIC PANEL
AG Ratio: 1.5 (calc) (ref 1.0–2.5)
ALT: 27 U/L (ref 9–46)
AST: 26 U/L (ref 10–35)
Albumin: 4.6 g/dL (ref 3.6–5.1)
Alkaline phosphatase (APISO): 80 U/L (ref 35–144)
BUN: 11 mg/dL (ref 7–25)
CO2: 23 mmol/L (ref 20–32)
Calcium: 9.4 mg/dL (ref 8.6–10.3)
Chloride: 102 mmol/L (ref 98–110)
Creat: 0.96 mg/dL (ref 0.70–1.18)
Globulin: 3.1 g/dL (calc) (ref 1.9–3.7)
Glucose, Bld: 133 mg/dL — ABNORMAL HIGH (ref 65–99)
Potassium: 3.4 mmol/L — ABNORMAL LOW (ref 3.5–5.3)
Sodium: 140 mmol/L (ref 135–146)
Total Bilirubin: 0.5 mg/dL (ref 0.2–1.2)
Total Protein: 7.7 g/dL (ref 6.1–8.1)

## 2019-09-18 LAB — HEMOGLOBIN A1C
Hgb A1c MFr Bld: 6.7 % of total Hgb — ABNORMAL HIGH (ref <5.7)
Mean Plasma Glucose: 146 (calc)
eAG (mmol/L): 8.1 (calc)

## 2019-09-18 LAB — MICROALBUMIN / CREATININE URINE RATIO
Creatinine, Urine: 102 mg/dL (ref 20–320)
Microalb Creat Ratio: 259 mcg/mg creat — ABNORMAL HIGH (ref <30)
Microalb, Ur: 26.4 mg/dL

## 2019-09-18 LAB — LIPID PANEL
Cholesterol: 201 mg/dL — ABNORMAL HIGH (ref <200)
HDL: 39 mg/dL — ABNORMAL LOW (ref >=40)
LDL Cholesterol (Calc): 140 mg/dL (calc) — ABNORMAL HIGH
Non-HDL Cholesterol (Calc): 162 mg/dL (calc) — ABNORMAL HIGH (ref <130)
Total CHOL/HDL Ratio: 5.2 (calc) — ABNORMAL HIGH (ref <5.0)
Triglycerides: 104 mg/dL (ref <150)

## 2019-09-18 LAB — HEPATITIS C ANTIBODY
Hepatitis C Ab: NONREACTIVE
SIGNAL TO CUT-OFF: 0.05 (ref <1.00)

## 2019-11-24 ENCOUNTER — Other Ambulatory Visit: Payer: Self-pay | Admitting: Family Medicine

## 2019-11-26 DIAGNOSIS — H401133 Primary open-angle glaucoma, bilateral, severe stage: Secondary | ICD-10-CM | POA: Diagnosis not present

## 2019-12-21 ENCOUNTER — Other Ambulatory Visit: Payer: Self-pay | Admitting: Family Medicine

## 2020-01-05 ENCOUNTER — Other Ambulatory Visit: Payer: Self-pay | Admitting: Family Medicine

## 2020-01-28 ENCOUNTER — Ambulatory Visit: Payer: Self-pay | Admitting: Pharmacist

## 2020-01-28 NOTE — Chronic Care Management (AMB) (Signed)
  Chronic Care Management   Follow Up Note   01/28/2020 Name: Ryan Mosley MRN: [id number] DOB: Nov 08, 1940  Referred by: Alycia Rossetti, MD Reason for referral : No chief complaint on file.   Ryan Mosley is a 79 y.o. year old male who is a primary care patient of [place], [place]ena Nunnery, MD. The CCM team was consulted for assistance with chronic disease management and care coordination needs.    Review of patient status, including review of consultants reports, relevant laboratory and other test results, and collaboration with appropriate care team members and the patient's provider was performed as part of comprehensive patient evaluation and provision of chronic care management services.    SDOH (Social Determinants of Health) assessments performed: No See Care Plan activities for detailed interventions related to [facility])     Outpatient Encounter Medications as of 01/28/2020  Medication Sig  . amLODipine (NORVASC) 5 MG tablet TAKE 1 TABLET BY MOUTH EVERY DAY FOR BLOOD PRESSURE  . latanoprost (XALATAN) 0.005 % ophthalmic solution INSTILL 1 DROP INTO BOTH EYES EVERY DAY AT NIGHT  . lisinopril (ZESTRIL) 40 MG tablet TAKE 1 TABLET BY MOUTH EVERY DAY  . rosuvastatin (CRESTOR) 5 MG tablet TAKE 1 TABLET 2 TIMES WEEKLY FOR CHOLESTEROL   No facility-administered encounter medications on file as of 01/28/2020.    Recent OV:  09/17/2019 [facility]) - follow up, started on flomax 0.4mg  hs for urinary frequency, continues crestor twice weekly as this is the most he is willing to take.  No other changes, physical in January.  Recent Relevant Labs: Lab Results  Component Value Date/Time   HGBA1C 6.7 (H) 09/17/2019 08:44 AM   HGBA1C 6.6 (H) 03/19/2019 09:03 AM   MICROALBUR 26.4 09/17/2019 08:50 AM    Kidney Function Lab Results  Component Value Date/Time   CREATININE 0.96 09/17/2019 08:44 AM   CREATININE 0.80 03/19/2019 09:03 AM    . Current antihyperglycemic regimen:  o No medications at  this time . What recent interventions/DTPs have been made to improve glycemic control:  o None . Have there been any recent hospitalizations or ED visits since last visit with CPP? No . Patient denies hypoglycemic symptoms, including Sweaty and Shaky . Patient denies hyperglycemic symptoms, including excessive thirst, fatigue, polyuria and weakness . How often are you checking your blood sugar? Once every 2 to 3 days . What are your blood sugars ranging?  o Fasting: 120s-130s . During the week, how often does your blood glucose drop below 70? Never . Are you checking your feet daily/regularly?  o Yes  Adherence Review: Is the patient currently on a STATIN medication? Yes Is the patient currently on ACE/ARB medication? Yes Does the patient have >5 day gap between last estimated fill dates? No  Patient is doing well overall.  He reports the lowest blood sugar he has had is in the 80s.  Not taking any medication for his blood sugar so not very concerned about hypoglycemia.  His A1c remains controlled.  Recommended he continue to check fasting and report back to [place] if FBG is consistently above 130.  Counseled on continuing to watch carbohydrates and processed foods and portion control.  Reminded patient of follow up phone call in January.  Beverly Milch, PharmD Clinical Pharmacist [place] [phone]

## 2020-02-11 ENCOUNTER — Other Ambulatory Visit: Payer: Self-pay | Admitting: Family Medicine

## 2020-02-23 DIAGNOSIS — Z5189 Encounter for other specified aftercare: Secondary | ICD-10-CM

## 2020-02-23 HISTORY — DX: Encounter for other specified aftercare: Z51.89

## 2020-02-25 DIAGNOSIS — H401133 Primary open-angle glaucoma, bilateral, severe stage: Secondary | ICD-10-CM | POA: Diagnosis not present

## 2020-03-03 NOTE — Chronic Care Management (AMB) (Signed)
Chronic Care Management Pharmacy  Name: Ryan Mosley  MRN: [id number] DOB: 02-Mar-1940  Chief Complaint/ HPI  Ryan Mosley,  80 y.o. , male presents for their Follow-Up CCM visit with the clinical pharmacist via telephone.  PCP : Alycia Rossetti, MD  Their chronic conditions include: hypertension, allergic rhinitis, hyperlipidemia, diabetes mellitus.  Office Visits:  09/17/2019 [facility]) - patient now willing to try medications for urinary frequency, tolerating twice weekly Crestor  03/19/2019 [facility]) - BP was slightly elevated however he is not taking his medications, on crestor once weekly, cholesterol elevated at lab draw this visit and increased to twice weekly  Consult Visit:  08/20/2019 [facility]) - primary open angle glaucoma, no med changes continue Latanoprost one drop in each eye nightly  Medications: Outpatient Encounter Medications as of 03/11/2020  Medication Sig  . amLODipine (NORVASC) 5 MG tablet TAKE 1 TABLET BY MOUTH EVERY DAY FOR BLOOD PRESSURE  . latanoprost (XALATAN) 0.005 % ophthalmic solution INSTILL 1 DROP INTO BOTH EYES EVERY DAY AT NIGHT  . lisinopril (ZESTRIL) 40 MG tablet TAKE 1 TABLET BY MOUTH EVERY DAY  . rosuvastatin (CRESTOR) 5 MG tablet TAKE 1 TABLET BY MOUTH ONCE A WEEK FOR CHOLESTEROL   No facility-administered encounter medications on file as of 03/11/2020.     Current Diagnosis/Assessment: Emergency planning/management officer Strain: Low Risk   . Difficulty of Paying Living Expenses: Not very hard    Goals Addressed            This Visit's Progress   . Pharmacy Care Plan:       CARE PLAN ENTRY (see longitudinal plan of care for additional care plan information)  Current Barriers:  . Chronic Disease Management support, education, and care coordination needs related to Hypertension, Hyperlipidemia, Diabetes, and allergic rhinitis.   Hypertension BP Readings from Last 3 Encounters:  09/17/19 (!) 142/80  03/19/19 (!) 144/90   09/29/18 (!) 188/98   . Pharmacist Clinical Goal(s): o Over the next 180 days, patient will work with PharmD and providers to achieve BP goal <130/80 . Current regimen:  o Amlodipine 5mg  daily o Lisinopril 40mg  daily . Interventions: o Reviewed home blood pressure monitoring o Counseled on adherence o Recommended home BP monitoring . Patient self care activities - Over the next 180 days, patient will: o Check BP periodically, document, and provide at future appointments o Ensure daily salt intake < 2300 mg/day o Contact providers if consistently > 140/90  Hyperlipidemia Lab Results  Component Value Date/Time   LDLCALC 140 (H) 09/17/2019 08:44 AM   . Pharmacist Clinical Goal(s): o Over the next 180 days, patient will work with PharmD and providers to achieve LDL goal < 100 . Current regimen:  o Rosuvastatin 5mg  twice weekly . Interventions: o Counseled on adherence and importance of statin medications o Evaluated for adverse effects o Recommend follow up lipid panel at 1/24 appt . Patient self care activities - Over the next 180 days, patient will: o Continue to focus on medication adherence by pill count o Reports to PCP for follow up and updated lipid panel  Diabetes Lab Results  Component Value Date/Time   HGBA1C 6.7 (H) 09/17/2019 08:44 AM   HGBA1C 6.6 (H) 03/19/2019 09:03 AM   . Pharmacist Clinical Goal(s): o Over the next 180 days, patient will work with PharmD and providers to maintain A1c goal <7% . Current regimen:  o No medications . Interventions: o Reviewed home blood sugar monitoring o Evaluated for episodes of hypoglycemia o Discussed diet  as it related to blood sugar control . Patient self care activities - Over the next 180 days, patient will: o Check blood sugar once daily, document, and provide at future appointments o Contact provider with any episodes of hypoglycemia (less than 70)  Allergic Rhinitis . Pharmacist Clinical Goal(s) o Over the  next 180 days, patient will work with PharmD and providers to optimize medication related to allergic rhinitis . Current regimen:  o Currently not taking any medications for allergies . Interventions: o Discussed symptom frequency and type  o Discussed other potential OTC options . Patient self care activities - Over the next 180 days, patient will: o Report to PCP for follow up to discuss alternative options o Contact providers with any increase or change in symptoms  Please see past updates related to this goal by clicking on the "Past Updates" button in the selected goal         Diabetes   Recent Relevant Labs: Lab Results  Component Value Date/Time   HGBA1C 6.7 (H) 09/17/2019 08:44 AM   HGBA1C 6.6 (H) 03/19/2019 09:03 AM   MICROALBUR 26.4 09/17/2019 08:50 AM     Checking BG: Daily  Recent FBG Readings: 118, 106, 94, 96  Patient has failed these meds in past: none noted Patient is currently controlled on the following medications: none noted at this time  Last diabetic Foot exam: No results found for: HMDIABEYEEXA  Last diabetic Eye exam: No results found for: HMDIABFOOTEX   We discussed:   Fasting sugars appear to be in range  He is working on portion control and carbohydrates  Denies symptoms of hyperglycemia  Plan  Continue control with diet and exercise  Hypertension     Office blood pressures are  BP Readings from Last 3 Encounters:  09/17/19 (!) 142/80  03/19/19 (!) 144/90  09/29/18 (!) 188/98    Patient has failed these meds in the past: losartan 50mg   Patient checks BP at home infrequently  Patient home BP readings are ranging: N/A  Patient is currently uncontrolled based on office BPs on the following medications:   Amlodipine 5mg  daily  Lisinopril 40mg  daily  Not currently checking blood pressure at home as he is too busy and has too much going on.  Does have access to cuff at home.  No swelling reported, denies chest pain/dizziness.   Reports adherence to medication.    UPDATE 03/11/20  Still not really checking BP at home.  He denies symptoms.  Has appointment with Dr. Buelah Manis on Monday the 24th and would recommend BP check.  If still elevated would consider amlodipine increase.  Plan  Continue current medications, recheck blood pressure in office, if upward trend continues recommend titration of amlodipine to 10mg .    Hyperlipidemia   LDL goal < 100  Lipid Panel     Component Value Date/Time   CHOL 201 (H) 09/17/2019 0844   TRIG 104 09/17/2019 0844   HDL 39 (L) 09/17/2019 0844   LDLCALC 140 (H) 09/17/2019 0844    Hepatic Function Latest Ref Rng & Units 09/17/2019 03/19/2019 09/11/2018  Total Protein 6.1 - 8.1 g/dL 7.7 8.0 7.6  AST 10 - 35 U/L 26 21 18   ALT 9 - 46 U/L 27 25 22   Total Bilirubin 0.2 - 1.2 mg/dL 0.5 0.6 0.6     The 10-year ASCVD risk score Mikey Bussing DC Jr., et al., 2013) is: 49.9%   Values used to calculate the score:     Age: 7 years  Sex: Male     Is Non-Hispanic African American: Yes     Diabetic: Yes     Tobacco smoker: No     Systolic Blood Pressure: 025 mmHg     Is BP treated: Yes     HDL Cholesterol: 39 mg/dL     Total Cholesterol: 201 mg/dL   Patient has failed these meds in past: none noted Patient is currently uncontrolled on the following medications:  . Rosuvastatin 5mg  twice weekly  Currently tolerating twice weekly dose.  Reports no myalgias.  Patient did report when he took it daily it gave him trouble in his legs.  Pharmacy filled last Rx as once weekly dose, however he is still taking it twice weekly as directed.  Counseled patient on importance of statins on cardio protection.  UPDATE 03/11/2020 [facility]. Recommend recheck lipid panel at upcoming PCP.  If elevated, would recommend considering three times per week dosing if patient is agreeable.  Plan  Continue current medications, recommend recheck lipids at upcoming visit, if still elevated see if he would be willing  to increase to three times weekly.  Allergic Rhinitis   Patient has failed these meds in past: none noted Patient is currently uncontrolled on the following medications:  . Loratadine 10mg  daily . Ocean 0.65% nasal soln  Patient reports nothing he takes for allergies works.  Reports in the past he has gotten a "shot" and a pack of ABX that knocks it out.  Has tried all OTC options per patient.  Says this happens about twice a year during season changes and nothing else has worked.  No longer taking medications above.  Plan  Discuss other alternatives with Dr. Buelah Manis at upcoming follow up visit.  Vaccines   Reviewed and discussed patient's vaccination history.    Immunization History  Administered Date(s) Administered  . Fluad Quad(high Dose 65+) 11/17/2019  . Influenza, High Dose Seasonal PF 11/21/2016, 12/20/2017, 11/15/2018  . PFIZER(Purple Top)SARS-COV-2 Vaccination 05/25/2019, 06/21/2019    Plan  Recommended shingles, patient declines. Reports he is current on pneumonia.   Medication Management   . Miscellaneous medications: latanoprost 0.005% eye drops . OTC's: none . Patient currently uses [facility].  Phone #  [phone] . Patient reports using no specific method to organize medications and promote adherence. . Patient denies missed doses of medication.   Beverly Milch, PharmD Clinical Pharmacist [place]  [phone]

## 2020-03-11 ENCOUNTER — Ambulatory Visit: Payer: Self-pay | Admitting: Pharmacist

## 2020-03-11 DIAGNOSIS — E782 Mixed hyperlipidemia: Secondary | ICD-10-CM

## 2020-03-11 DIAGNOSIS — I10 Essential (primary) hypertension: Secondary | ICD-10-CM

## 2020-03-11 DIAGNOSIS — E119 Type 2 diabetes mellitus without complications: Secondary | ICD-10-CM

## 2020-03-12 NOTE — Patient Instructions (Addendum)
Visit Information  Goals Addressed            This Visit's Progress   . Pharmacy Care Plan:       CARE PLAN ENTRY (see longitudinal plan of care for additional care plan information)  Current Barriers:  . Chronic Disease Management support, education, and care coordination needs related to Hypertension, Hyperlipidemia, Diabetes, and allergic rhinitis.   Hypertension BP Readings from Last 3 Encounters:  09/17/19 (!) 142/80  03/19/19 (!) 144/90  09/29/18 (!) 188/98   . Pharmacist Clinical Goal(s): o Over the next 180 days, patient will work with PharmD and providers to achieve BP goal <130/80 . Current regimen:  o Amlodipine 5mg  daily o Lisinopril 40mg  daily . Interventions: o Reviewed home blood pressure monitoring o Counseled on adherence o Recommended home BP monitoring . Patient self care activities - Over the next 180 days, patient will: o Check BP periodically, document, and provide at future appointments o Ensure daily salt intake < 2300 mg/day o Contact providers if consistently > 140/90  Hyperlipidemia Lab Results  Component Value Date/Time   LDLCALC 140 (H) 09/17/2019 08:44 AM   . Pharmacist Clinical Goal(s): o Over the next 180 days, patient will work with PharmD and providers to achieve LDL goal < 100 . Current regimen:  o Rosuvastatin 5mg  twice weekly . Interventions: o Counseled on adherence and importance of statin medications o Evaluated for adverse effects o Recommend follow up lipid panel at 1/24 appt . Patient self care activities - Over the next 180 days, patient will: o Continue to focus on medication adherence by pill count o Reports to PCP for follow up and updated lipid panel  Diabetes Lab Results  Component Value Date/Time   HGBA1C 6.7 (H) 09/17/2019 08:44 AM   HGBA1C 6.6 (H) 03/19/2019 09:03 AM   . Pharmacist Clinical Goal(s): o Over the next 180 days, patient will work with PharmD and providers to maintain A1c goal <7% . Current  regimen:  o No medications . Interventions: o Reviewed home blood sugar monitoring o Evaluated for episodes of hypoglycemia o Discussed diet as it related to blood sugar control . Patient self care activities - Over the next 180 days, patient will: o Check blood sugar once daily, document, and provide at future appointments o Contact provider with any episodes of hypoglycemia (less than 70)  Allergic Rhinitis . Pharmacist Clinical Goal(s) o Over the next 180 days, patient will work with PharmD and providers to optimize medication related to allergic rhinitis . Current regimen:  o Currently not taking any medications for allergies . Interventions: o Discussed symptom frequency and type  o Discussed other potential OTC options . Patient self care activities - Over the next 180 days, patient will: o Report to PCP for follow up to discuss alternative options o Contact providers with any increase or change in symptoms  Please see past updates related to this goal by clicking on the "Past Updates" button in the selected goal         The patient verbalized understanding of instructions, educational materials, and care plan provided today and agreed to receive a mailed copy of patient instructions, educational materials, and care plan.   Telephone follow up appointment with pharmacy team member scheduled for: 6 months  Edythe Clarity, [facility]  Dyslipidemia Dyslipidemia is an imbalance of waxy, fat-like substances (lipids) in the blood. The body needs lipids in small amounts. Dyslipidemia often involves a high level of cholesterol or triglycerides, which are types  of lipids. Common forms of dyslipidemia include:  High levels of LDL cholesterol. LDL is the type of cholesterol that causes fatty deposits (plaques) to build up in the blood vessels that carry blood away from your heart (arteries).  Low levels of HDL cholesterol. HDL cholesterol is the type of cholesterol that protects  against heart disease. High levels of HDL remove the LDL buildup from arteries.  High levels of triglycerides. Triglycerides are a fatty substance in the blood that is linked to a buildup of plaques in the arteries. What are the causes? Primary dyslipidemia is caused by changes (mutations) in genes that are passed down through families (inherited). These mutations cause several types of dyslipidemia. Secondary dyslipidemia is caused by lifestyle choices and diseases that lead to dyslipidemia, such as:  Eating a diet that is high in animal fat.  Not getting enough exercise.  Having diabetes, kidney disease, liver disease, or thyroid disease.  Drinking large amounts of alcohol.  Using certain medicines. What increases the risk? You are more likely to develop this condition if you are an older man or if you are a woman who has gone through menopause. Other risk factors include:  Having a family history of dyslipidemia.  Taking certain medicines, including birth control pills, steroids, some diuretics, and beta-blockers.  Smoking cigarettes.  Eating a high-fat diet.  Having certain medical conditions such as diabetes, polycystic ovary syndrome (PCOS), kidney disease, liver disease, or hypothyroidism.  Not exercising regularly.  Being overweight or obese with too much belly fat. What are the signs or symptoms? In most cases, dyslipidemia does not usually cause any symptoms. In severe cases, very high lipid levels can cause:  Fatty bumps under the skin (xanthomas).  White or gray ring around the black center (pupil) of the eye. Very high triglyceride levels can cause inflammation of the pancreas (pancreatitis). How is this diagnosed? Your health care provider may diagnose dyslipidemia based on a routine blood test (fasting blood test). Because most people do not have symptoms of the condition, this blood testing (lipid profile) is done on adults age 90 and older and is repeated  every 5 years. This test checks:  Total cholesterol. This measures the total amount of cholesterol in your blood, including LDL cholesterol, HDL cholesterol, and triglycerides. A healthy number is below 200.  LDL cholesterol. The target number for LDL cholesterol is different for each person, depending on individual risk factors. Ask your health care provider what your LDL cholesterol should be.  HDL cholesterol. An HDL level of 60 or higher is best because it helps to protect against heart disease. A number below 36 for men or below 56 for women increases the risk for heart disease.  Triglycerides. A healthy triglyceride number is below 150. If your lipid profile is abnormal, your health care provider may do other blood tests.   How is this treated? Treatment depends on the type of dyslipidemia that you have and your other risk factors for heart disease and stroke. Your health care provider will have a target range for your lipid levels based on this information. For many people, this condition may be treated by lifestyle changes, such as diet and exercise. Your health care provider may recommend that you:  Get regular exercise.  Make changes to your diet.  Quit smoking if you smoke. If diet changes and exercise do not help you reach your goals, your health care provider may also prescribe medicine to lower lipids. The most commonly prescribed type of  medicine lowers your LDL cholesterol (statin drug). If you have a high triglyceride level, your provider may prescribe another type of drug (fibrate) or an omega-3 fish oil supplement, or both. Follow these instructions at home: Eating and drinking  Follow instructions from your health care provider or dietitian about eating or drinking restrictions.  Eat a healthy diet as told by your health care provider. This can help you reach and maintain a healthy weight, lower your LDL cholesterol, and raise your HDL cholesterol. This may  include: ? Limiting your calories, if you are overweight. ? Eating more fruits, vegetables, whole grains, fish, and lean meats. ? Limiting saturated fat, trans fat, and cholesterol.  If you drink alcohol: ? Limit how much you use. ? Be aware of how much alcohol is in your drink. In the [place] one drink equals one 12 oz bottle of beer (355 mL), one 5 oz glass of wine (148 mL), or one 1 oz glass of hard liquor (44 mL).  Do not drink alcohol if: ? Your health care provider tells you not to drink. ? You are pregnant, may be pregnant, or are planning to become pregnant. Activity  Get regular exercise. Start an exercise and strength training program as told by your health care provider. Ask your health care provider what activities are safe for you. Your health care provider may recommend: ? 30 minutes of aerobic activity 4-6 days a week. Brisk walking is an example of aerobic activity. ? Strength training 2 days a week. General instructions  Do not use any products that contain nicotine or tobacco, such as cigarettes, e-cigarettes, and chewing tobacco. If you need help quitting, ask your health care provider.  Take over-the-counter and prescription medicines only as told by your health care provider. This includes supplements.  Keep all follow-up visits as told by your health care provider.   Contact a health care provider if:  You are: ? Having trouble sticking to your exercise or diet plan. ? Struggling to quit smoking or control your use of alcohol. Summary  Dyslipidemia often involves a high level of cholesterol or triglycerides, which are types of lipids.  Treatment depends on the type of dyslipidemia that you have and your other risk factors for heart disease and stroke.  For many people, treatment starts with lifestyle changes, such as diet and exercise.  Your health care provider may prescribe medicine to lower lipids. This information is not intended to replace advice given  to you by your health care provider. Make sure you discuss any questions you have with your health care provider. Document Revised: 10/03/2017 Document Reviewed: 09/09/2017 Elsevier Patient Education  [place].

## 2020-03-17 ENCOUNTER — Encounter: Payer: Self-pay | Admitting: Family Medicine

## 2020-03-17 ENCOUNTER — Ambulatory Visit (INDEPENDENT_AMBULATORY_CARE_PROVIDER_SITE_OTHER): Payer: Medicare Other | Admitting: Family Medicine

## 2020-03-17 ENCOUNTER — Other Ambulatory Visit: Payer: Self-pay

## 2020-03-17 VITALS — BP 144/78 | HR 62 | Temp 98.3°F | Resp 16 | Ht 69.5 in | Wt 173.0 lb

## 2020-03-17 DIAGNOSIS — Z0001 Encounter for general adult medical examination with abnormal findings: Secondary | ICD-10-CM | POA: Diagnosis not present

## 2020-03-17 DIAGNOSIS — E119 Type 2 diabetes mellitus without complications: Secondary | ICD-10-CM | POA: Diagnosis not present

## 2020-03-17 DIAGNOSIS — R350 Frequency of micturition: Secondary | ICD-10-CM

## 2020-03-17 DIAGNOSIS — H401132 Primary open-angle glaucoma, bilateral, moderate stage: Secondary | ICD-10-CM | POA: Diagnosis not present

## 2020-03-17 DIAGNOSIS — I10 Essential (primary) hypertension: Secondary | ICD-10-CM | POA: Diagnosis not present

## 2020-03-17 DIAGNOSIS — N401 Enlarged prostate with lower urinary tract symptoms: Secondary | ICD-10-CM

## 2020-03-17 DIAGNOSIS — E782 Mixed hyperlipidemia: Secondary | ICD-10-CM

## 2020-03-17 DIAGNOSIS — Z0000 Encounter for general adult medical examination without abnormal findings: Secondary | ICD-10-CM

## 2020-03-17 MED ORDER — LISINOPRIL 40 MG PO TABS: 40.0000 mg | ORAL_TABLET | Freq: Every day | ORAL | 2 refills | Status: DC

## 2020-03-17 MED ORDER — ROSUVASTATIN CALCIUM 5 MG PO TABS: ORAL_TABLET | ORAL | 2 refills | Status: DC

## 2020-03-17 MED ORDER — TAMSULOSIN HCL 0.4 MG PO CAPS: 0.4000 mg | ORAL_CAPSULE | Freq: Every day | ORAL | 3 refills | Status: DC

## 2020-03-17 MED ORDER — AMLODIPINE BESYLATE 5 MG PO TABS: ORAL_TABLET | ORAL | 2 refills | Status: DC

## 2020-03-17 NOTE — Addendum Note (Signed)
Addended by: Sheral Flow on: 03/17/2020 10:17 AM   Modules accepted: Orders

## 2020-03-17 NOTE — Addendum Note (Signed)
Addended by: Vic Blackbird F on: 03/17/2020 04:25 PM   Modules accepted: Orders

## 2020-03-17 NOTE — Progress Notes (Addendum)
Subjective:   Patient presents for Medicare Annual/Subsequent preventive examination.    Hypertension states his blood pressure is running good at home he has not taken his medication today he is on the lisinopril 40 mg and amlodipine 5 mg.  Hyperlipidemia  he is taking Crestor 5 mg once a week , due for fasting labs   He is followed by Dr. Jearld Fenton   He is currently on drops for glaucoma  DM- last A1C 6.7% - 6 months ago , currently diet controlled   He had 1 fall he was at store and had stuff in in his and skinned up his eyes   BPH continues to have issues due to glaucoma flomax was not sent   Chronic rhinitis sinus issues, declines meds/ENT,   Review Past Medical/Family/Social: Per EMR   Risk Factors  Current exercise habits: Stays active  dietary issues discussed: No major concerns  Cardiac risk factors: Hypertension, hyperlipidemia, cardiac arrhythmia  Depression Screen  (Note: if answer to either of the following is "Yes", a more complete depression screening is indicated)  Over the past two weeks, have you felt down, depressed or hopeless? No Over the past two weeks, have you felt little interest or pleasure in doing things? No Have you lost interest or pleasure in daily life? No Do you often feel hopeless? No Do you cry easily over simple problems? No   Activities of Daily Living  In your present state of health, do you have any difficulty performing the following activities?:  Driving? No  Managing money? No  Feeding yourself? No  Getting from bed to chair? No  Climbing a flight of stairs? No  Preparing food and eating?: No  Bathing or showering? No  Getting dressed: No  Getting to the toilet? No  Using the toilet:No  Moving around from place to place: No  In the past year have you fallen or had a near fall?:Yes, no  Injury   Hearing Difficulties: No  Do you often ask people to speak up or repeat themselves? No  Do you experience ringing or noises  in your ears? No Do you have difficulty understanding soft or whispered voices? No  Do you feel that you have a problem with memory? No Do you often misplace items? No  Do you feel safe at home? Yes  Cognitive Testing  Alert? Yes Normal Appearance?Yes  Oriented to person? Yes Place? Yes  Time? Yes  Recall of three objects? Yes  Can perform simple calculations? Yes  Displays appropriate judgment?Yes  Can read the correct time from a watch face?Yes   List the Names of Other Physician/Practitioners you currently use:   Dr. Gershon Crane    Screening Tests / Date Colonoscopy over age                     Influenza Vaccine UTD Tetanus/tdap/shingles/PNA- DECLINES  COVID-19 vaccine UTD, booster UTD per pt    ROS:  GEN- denies fatigue, fever, weight loss,weakness, recent illness HEENT- denies eye drainage, change in vision, nasal discharge, CVS- denies chest pain, palpitations RESP- denies SOB, cough, wheeze ABD- denies N/V, change in stools, abd pain GU- denies dysuria, hematuria, dribbling, incontinence MSK- denies joint pain, muscle aches, injury Neuro- denies headache, dizziness, syncope, seizure activity  Physical: GEN- NAD, alert and oriented x3,vitals reviewed  HEENT- PERRL, EOMI, non injected sclera, pink conjunctiva, MMM, TM clear no effusion  Neck- Supple, no thryomegaly, no bruit  CVS- RRR, no murmur RESP-CTAB ABD-NABS,soft,NT,ND  EXT- No edema Pulses- Radial, DP- 2+  Depression/audit C screen negative  Assessment:    Annual wellness medicare exam   Plan:    During the course of the visit the patient was educated and counseled about appropriate screening and preventive services including:   CPE Done. He declines further immunizations.  Hypertension- at baseline, no changes to meds   Hyperlipidemia continue Crestor weekly check lipid panel  Diabetes mellitus diet-controlled we will recheck A1c , goal less than 7% without medications  Chronic  nasal congestion rhinitis - Declines ENT , note pt often wants zpak and shots but doesn't have acute sinus infection.    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 Gershon Crane, okay to use flomax at this time for BPH   Given information on advanced directives - Full code        Diet review for nutrition referral? Yes ____ Not Indicated __x__  Patient Instructions (the written plan) was given to the patient.    Diet review for nutrition referral? Yes ____ Not Indicated __x__  Patient Instructions (the written plan) was given to the patient.  Medicare Attestation  I have personally reviewed:  The patient's medical and social history  Their use of alcohol, tobacco or illicit drugs  Their current medications and supplements  The patient's functional ability including ADLs,fall risks, home safety risks, cognitive, and hearing and visual impairment  Diet and physical activities  Evidence for depression or mood disorders  The patient's weight, height, BMI, and visual acuity have been recorded in the chart. I have made referrals, counseling, and provided education to the patient based on review of the above and I have provided the patient with a written personalized care plan for preventive services.

## 2020-03-17 NOTE — Patient Instructions (Addendum)
We will call with lab results   

## 2020-03-18 LAB — COMPREHENSIVE METABOLIC PANEL
AG Ratio: 1.6 (calc) (ref 1.0–2.5)
ALT: 23 U/L (ref 9–46)
AST: 21 U/L (ref 10–35)
Albumin: 4.6 g/dL (ref 3.6–5.1)
Alkaline phosphatase (APISO): 84 U/L (ref 35–144)
BUN: 12 mg/dL (ref 7–25)
CO2: 27 mmol/L (ref 20–32)
Calcium: 9.4 mg/dL (ref 8.6–10.3)
Chloride: 105 mmol/L (ref 98–110)
Creat: 0.83 mg/dL (ref 0.70–1.18)
Globulin: 2.8 g/dL (calc) (ref 1.9–3.7)
Glucose, Bld: 124 mg/dL — ABNORMAL HIGH (ref 65–99)
Potassium: 3.7 mmol/L (ref 3.5–5.3)
Sodium: 141 mmol/L (ref 135–146)
Total Bilirubin: 0.5 mg/dL (ref 0.2–1.2)
Total Protein: 7.4 g/dL (ref 6.1–8.1)

## 2020-03-18 LAB — CBC WITH DIFFERENTIAL/PLATELET
Absolute Monocytes: 608 cells/uL (ref 200–950)
Basophils Absolute: 39 cells/uL (ref 0–200)
Basophils Relative: 0.5 %
Eosinophils Absolute: 101 cells/uL (ref 15–500)
Eosinophils Relative: 1.3 %
HCT: 45.2 % (ref 38.5–50.0)
Hemoglobin: 15.2 g/dL (ref 13.2–17.1)
Lymphs Abs: 2223 cells/uL (ref 850–3900)
MCH: 30.3 pg (ref 27.0–33.0)
MCHC: 33.6 g/dL (ref 32.0–36.0)
MCV: 90.2 fL (ref 80.0–100.0)
MPV: 10.3 fL (ref 7.5–12.5)
Monocytes Relative: 7.8 %
Neutro Abs: 4828 cells/uL (ref 1500–7800)
Neutrophils Relative %: 61.9 %
Platelets: 243 10*3/uL (ref 140–400)
RBC: 5.01 10*6/uL (ref 4.20–5.80)
RDW: 12.6 % (ref 11.0–15.0)
Total Lymphocyte: 28.5 %
WBC: 7.8 10*3/uL (ref 3.8–10.8)

## 2020-03-18 LAB — LIPID PANEL
Cholesterol: 208 mg/dL — ABNORMAL HIGH (ref <200)
HDL: 44 mg/dL (ref >=40)
LDL Cholesterol (Calc): 142 mg/dL (calc) — ABNORMAL HIGH
Non-HDL Cholesterol (Calc): 164 mg/dL (calc) — ABNORMAL HIGH (ref <130)
Total CHOL/HDL Ratio: 4.7 (calc) (ref <5.0)
Triglycerides: 104 mg/dL (ref <150)

## 2020-03-18 LAB — HEMOGLOBIN A1C
Hgb A1c MFr Bld: 6.9 % of total Hgb — ABNORMAL HIGH (ref <5.7)
Mean Plasma Glucose: 151 mg/dL
eAG (mmol/L): 8.4 mmol/L

## 2020-03-24 ENCOUNTER — Encounter: Payer: Self-pay | Admitting: *Deleted

## 2020-03-31 ENCOUNTER — Telehealth: Payer: Self-pay | Admitting: Pharmacist

## 2020-03-31 NOTE — Progress Notes (Addendum)
Chronic Care Management Pharmacy Assistant   Name: Tallen Schnorr  MRN: [id number] DOB: [birth year]  Reason for Encounter: Disease State For [facility]  Patient Questions:  1.  Have you seen any other providers since your last visit? Yes.   2.  Any changes in your medicines or health? Yes.  PCP : Alycia Rossetti, MD  Their chronic conditions include: hypertension, allergic rhinitis, hyperlipidemia, diabetes mellitus.  Office Visits: 03/17/20 Dr. Buelah Manis. Routine general exam. STARTED Tamsulosin 0.4 mg daily.   Consults: None since 01/28/20  Allergies:  No Known Allergies  Medications: Outpatient Encounter Medications as of 03/31/2020  Medication Sig   amLODipine (NORVASC) 5 MG tablet TAKE 1 TABLET BY MOUTH EVERY DAY FOR BLOOD PRESSURE   latanoprost (XALATAN) 0.005 % ophthalmic solution INSTILL 1 DROP INTO BOTH EYES EVERY DAY AT NIGHT   lisinopril (ZESTRIL) 40 MG tablet Take 1 tablet (40 mg total) by mouth daily.   rosuvastatin (CRESTOR) 5 MG tablet TAKE 1 TABLET BY MOUTH ONCE A WEEK FOR CHOLESTEROL   tamsulosin (FLOMAX) 0.4 MG CAPS capsule Take 1 capsule (0.4 mg total) by mouth daily.   No facility-administered encounter medications on file as of 03/31/2020.    Current Diagnosis: Patient Active Problem List   Diagnosis Date Noted   BPH (benign prostatic hyperplasia) 09/17/2019   Primary open angle glaucoma (POAG) of both eyes 03/19/2019   Allergic rhinitis 09/11/2018   Hyperlipemia 05/29/2017   Hypertension 05/27/2017   Diabetes mellitus without complication ([place]) 74/25/9563    Goals Addressed   None    03/31/2020 Name: Yisrael Obryan MRN: [id number] DOB: 10-Jul-1940 [place] Lasecki is a 80 y.o. year old male who is a primary care patient of [place], Modena Nunnery, MD.  Comprehensive medication review performed; Spoke to patient regarding cholesterol  Lipid Panel    Component Value Date/Time   CHOL 208 (H) 03/17/2020 0842   TRIG 104 03/17/2020 0842   HDL 44 03/17/2020 0842    LDLCALC 142 (H) 03/17/2020 0842    10-year ASCVD risk score: The 10-year ASCVD risk score Mikey Bussing DC Brooke Bonito., et al., 2013) is: 49.9%   Values used to calculate the score:     Age: 5 years     Sex: Male     Is Non-Hispanic African American: Yes     Diabetic: Yes     Tobacco smoker: No     Systolic Blood Pressure: 518 mmHg     Is BP treated: Yes     HDL Cholesterol: 44 mg/dL     Total Cholesterol: 208 mg/dL  Current antihyperlipidemic regimen:  Rosuvastatin 5 mg twice weekly  Previous antihyperlipidemic medications tried: None noted.  ASCVD risk enhancing conditions: age >23, DM and HTN   What recent interventions/DTPs have been made by any provider to improve Cholesterol control since last CPP Visit: None.  Any recent hospitalizations or ED visits since last visit with CPP? Patient stated no.   What diet changes have been made to improve Cholesterol?  Patient stated he eats vegetables, and the only fruit he eats are oranges. Patient stated he eats beef and chicken, sometimes its fired or backed.Patient stated he drinks about 1 gallon of water a day and sometimes he drinks pepsi.  What exercise is being done to improve Cholesterol?  Patient he does a lot of wok outside and sometimes he does work in the house to help his wife.   Adherence Review: Does the patient have >5 day gap between last estimated fill dates? No  Patient stated he wants to stop taking Rosuvastatin 5 mg it is causing his feet to get crossed up and trip. He stated he doesn't want to fall. He stated he use to take the medication once a week but now its back to twice a week but he wants something entirely different to take. (sent a message to Dr. Buelah Manis nurse in regards of this)   Follow-Up:  Pharmacist Review   Charlann Lange, RMA Clinical Pharmacist Assistant [phone]  10 minutes spent in review, coordination, and documentation.  Will consult with PCP on best options for his cholesterol.  Reviewed  by: Beverly Milch, PharmD Clinical Pharmacist [facility] [phone]

## 2020-04-01 ENCOUNTER — Telehealth: Payer: Self-pay | Admitting: *Deleted

## 2020-04-01 NOTE — Telephone Encounter (Signed)
-----   Message from Alycia Rossetti, MD sent at 03/31/2020  4:58 PM EST ----- Regarding: RE: Medication Concerns  This is not a typical side effect. But if he doesn't want to take , he can change to lipitor 80mg  and take this twice a week    ----- Message ----- From: Sheral Flow, LPN Sent: 03/26/2409   3:34 PM EST To: Alycia Rossetti, MD Subject: FW: Medication Concerns                         ----- Message ----- From: Ryan Mosley Sent: 03/31/2020   3:29 PM EST To: Eden Lathe Shyenne Maggard, LPN Subject: Medication Concerns                            Good afternoon I spoke with the patient stated he wants to stop taking Rosuvastatin 5 mg it is causing his feet to get crossed up and trip. He stated he doesn't want to fall. He stated he use to take the medication once a week but now its back to twice a week but he wants something entirely different to take. Ive also informed the Clinical Pharmacist in regards of this concern.

## 2020-04-01 NOTE — Telephone Encounter (Signed)
Call placed to patient and patient wife made aware.   States that patient will call back with which way he would like to proceed.

## 2020-05-26 DIAGNOSIS — H52201 Unspecified astigmatism, right eye: Secondary | ICD-10-CM | POA: Diagnosis not present

## 2020-05-26 DIAGNOSIS — H401133 Primary open-angle glaucoma, bilateral, severe stage: Secondary | ICD-10-CM | POA: Diagnosis not present

## 2020-05-26 DIAGNOSIS — H524 Presbyopia: Secondary | ICD-10-CM | POA: Diagnosis not present

## 2020-05-26 DIAGNOSIS — H5211 Myopia, right eye: Secondary | ICD-10-CM | POA: Diagnosis not present

## 2020-05-26 DIAGNOSIS — Z961 Presence of intraocular lens: Secondary | ICD-10-CM | POA: Diagnosis not present

## 2020-05-28 ENCOUNTER — Telehealth: Payer: Self-pay | Admitting: Pharmacist

## 2020-05-28 NOTE — Progress Notes (Addendum)
    Chronic Care Management Pharmacy Assistant   Name: Ryan Mosley  MRN: [id number] DOB: [birth year]  Reason for Encounter: Disease State For DM.   Conditions to be addressed/monitored: hypertension, allergic rhinitis, hyperlipidemia, diabetes mellitus.  Recent office visits:  None since 03/31/20  Recent consult visits:  None since 03/31/20  Hospital visits:  None in previous 6 months  Medications: Outpatient Encounter Medications as of 05/28/2020  Medication Sig   amLODipine (NORVASC) 5 MG tablet TAKE 1 TABLET BY MOUTH EVERY DAY FOR BLOOD PRESSURE   latanoprost (XALATAN) 0.005 % ophthalmic solution INSTILL 1 DROP INTO BOTH EYES EVERY DAY AT NIGHT   lisinopril (ZESTRIL) 40 MG tablet Take 1 tablet (40 mg total) by mouth daily.   rosuvastatin (CRESTOR) 5 MG tablet TAKE 1 TABLET BY MOUTH ONCE A WEEK FOR CHOLESTEROL   tamsulosin (FLOMAX) 0.4 MG CAPS capsule Take 1 capsule (0.4 mg total) by mouth daily.   No facility-administered encounter medications on file as of 05/28/2020.   Recent Relevant Labs: Lab Results  Component Value Date/Time   HGBA1C 6.9 (H) 03/17/2020 08:42 AM   HGBA1C 6.7 (H) 09/17/2019 08:44 AM   MICROALBUR 26.4 09/17/2019 08:50 AM    Kidney Function Lab Results  Component Value Date/Time   CREATININE 0.83 03/17/2020 08:42 AM   CREATININE 0.96 09/17/2019 08:44 AM    Current antihyperglycemic regimen:  None  What recent interventions/DTPs have been made to improve glycemic control:  None  Have there been any recent hospitalizations or ED visits since last visit with CPP? Patient stated no.  Patient denies hypoglycemic symptoms, including None   Patient reports hyperglycemic symptoms, including polyuria at night.  How often are you checking your blood sugar? Patient stated 3-4 times daily   What are your blood sugars ranging? Patient stated his blood sugar readings have been around the 116-120's  During the week, how often does your blood glucose drop  below 70? Patient stated no.  Are you checking your feet daily/regularly?  Patient stated yes he checks his feet daily.  Adherence Review: Is the patient currently on a STATIN medication? Yes, Rosuvastatin 5 mg   Is the patient currently on ACE/ARB medication? Yes, Lisinopril 40 mg  Does the patient have >5 day gap between last estimated fill dates? Per misc rpts, no.  Star Rating Drugs: Rosuvastatin 5 mg 90 DS 02/11/20 ,  Lisinopril 40 mg 90 DS 02/25/20   Follow-Up:Pharmacist Review  Charlann Lange, RMA Clinical Pharmacist Assistant [phone]  5 minutes spent in review, coordination, and documentation.  Reviewed by: Beverly Milch, PharmD Clinical Pharmacist [facility] [phone]

## 2020-06-13 ENCOUNTER — Other Ambulatory Visit: Payer: Self-pay | Admitting: Family Medicine

## 2020-07-07 ENCOUNTER — Other Ambulatory Visit: Payer: Self-pay

## 2020-07-07 ENCOUNTER — Encounter (HOSPITAL_COMMUNITY): Payer: Self-pay | Admitting: Internal Medicine

## 2020-07-07 ENCOUNTER — Inpatient Hospital Stay (HOSPITAL_COMMUNITY)
Admission: EM | Admit: 2020-07-07 | Discharge: 2020-07-09 | DRG: 394 | Disposition: A | Discharge status: Home / Self Care | Payer: Medicare Other | Attending: Internal Medicine | Admitting: Internal Medicine

## 2020-07-07 ENCOUNTER — Emergency Department (HOSPITAL_COMMUNITY): Payer: Medicare Other

## 2020-07-07 DIAGNOSIS — J432 Centrilobular emphysema: Secondary | ICD-10-CM | POA: Diagnosis not present

## 2020-07-07 DIAGNOSIS — R58 Hemorrhage, not elsewhere classified: Secondary | ICD-10-CM | POA: Diagnosis not present

## 2020-07-07 DIAGNOSIS — R350 Frequency of micturition: Secondary | ICD-10-CM | POA: Diagnosis not present

## 2020-07-07 DIAGNOSIS — D62 Acute posthemorrhagic anemia: Secondary | ICD-10-CM | POA: Diagnosis present

## 2020-07-07 DIAGNOSIS — K6289 Other specified diseases of anus and rectum: Secondary | ICD-10-CM | POA: Diagnosis not present

## 2020-07-07 DIAGNOSIS — E119 Type 2 diabetes mellitus without complications: Secondary | ICD-10-CM

## 2020-07-07 DIAGNOSIS — K5730 Diverticulosis of large intestine without perforation or abscess without bleeding: Secondary | ICD-10-CM | POA: Diagnosis present

## 2020-07-07 DIAGNOSIS — J984 Other disorders of lung: Secondary | ICD-10-CM | POA: Diagnosis not present

## 2020-07-07 DIAGNOSIS — E785 Hyperlipidemia, unspecified: Secondary | ICD-10-CM | POA: Diagnosis present

## 2020-07-07 DIAGNOSIS — I10 Essential (primary) hypertension: Secondary | ICD-10-CM | POA: Diagnosis not present

## 2020-07-07 DIAGNOSIS — H401130 Primary open-angle glaucoma, bilateral, stage unspecified: Secondary | ICD-10-CM | POA: Diagnosis not present

## 2020-07-07 DIAGNOSIS — Z79899 Other long term (current) drug therapy: Secondary | ICD-10-CM

## 2020-07-07 DIAGNOSIS — Z20822 Contact with and (suspected) exposure to covid-19: Secondary | ICD-10-CM | POA: Diagnosis not present

## 2020-07-07 DIAGNOSIS — K6282 Dysplasia of anus: Principal | ICD-10-CM | POA: Diagnosis present

## 2020-07-07 DIAGNOSIS — N400 Enlarged prostate without lower urinary tract symptoms: Secondary | ICD-10-CM | POA: Diagnosis present

## 2020-07-07 DIAGNOSIS — N401 Enlarged prostate with lower urinary tract symptoms: Secondary | ICD-10-CM | POA: Diagnosis not present

## 2020-07-07 DIAGNOSIS — R6889 Other general symptoms and signs: Secondary | ICD-10-CM | POA: Diagnosis not present

## 2020-07-07 DIAGNOSIS — K922 Gastrointestinal hemorrhage, unspecified: Secondary | ICD-10-CM | POA: Diagnosis present

## 2020-07-07 DIAGNOSIS — Z743 Need for continuous supervision: Secondary | ICD-10-CM | POA: Diagnosis not present

## 2020-07-07 DIAGNOSIS — C20 Malignant neoplasm of rectum: Secondary | ICD-10-CM

## 2020-07-07 DIAGNOSIS — R531 Weakness: Secondary | ICD-10-CM | POA: Diagnosis not present

## 2020-07-07 DIAGNOSIS — Z87891 Personal history of nicotine dependence: Secondary | ICD-10-CM

## 2020-07-07 DIAGNOSIS — M47814 Spondylosis without myelopathy or radiculopathy, thoracic region: Secondary | ICD-10-CM | POA: Diagnosis not present

## 2020-07-07 DIAGNOSIS — K5731 Diverticulosis of large intestine without perforation or abscess with bleeding: Secondary | ICD-10-CM | POA: Diagnosis not present

## 2020-07-07 DIAGNOSIS — D128 Benign neoplasm of rectum: Secondary | ICD-10-CM | POA: Diagnosis not present

## 2020-07-07 DIAGNOSIS — K625 Hemorrhage of anus and rectum: Secondary | ICD-10-CM | POA: Diagnosis not present

## 2020-07-07 DIAGNOSIS — J986 Disorders of diaphragm: Secondary | ICD-10-CM | POA: Diagnosis not present

## 2020-07-07 HISTORY — DX: Acute posthemorrhagic anemia: D62

## 2020-07-07 HISTORY — DX: Gastrointestinal hemorrhage, unspecified: K92.2

## 2020-07-07 LAB — COMPREHENSIVE METABOLIC PANEL
ALT: 17 U/L (ref 0–44)
AST: 20 U/L (ref 15–41)
Albumin: 3.3 g/dL — ABNORMAL LOW (ref 3.5–5.0)
Alkaline Phosphatase: 56 U/L (ref 38–126)
Anion gap: 6 (ref 5–15)
BUN: 10 mg/dL (ref 8–23)
CO2: 26 mmol/L (ref 22–32)
Calcium: 8.3 mg/dL — ABNORMAL LOW (ref 8.9–10.3)
Chloride: 105 mmol/L (ref 98–111)
Creatinine, Ser: 1.09 mg/dL (ref 0.61–1.24)
GFR, Estimated: >60 mL/min (ref >60)
Glucose, Bld: 237 mg/dL — ABNORMAL HIGH (ref 70–99)
Potassium: 3.1 mmol/L — ABNORMAL LOW (ref 3.5–5.1)
Sodium: 137 mmol/L (ref 135–145)
Total Bilirubin: 0.4 mg/dL (ref 0.3–1.2)
Total Protein: 6 g/dL — ABNORMAL LOW (ref 6.5–8.1)

## 2020-07-07 LAB — CBC
HCT: 26.1 % — ABNORMAL LOW (ref 39.0–52.0)
Hemoglobin: 7.9 g/dL — ABNORMAL LOW (ref 13.0–17.0)
MCH: 27.8 pg (ref 26.0–34.0)
MCHC: 30.3 g/dL (ref 30.0–36.0)
MCV: 91.9 fL (ref 80.0–100.0)
Platelets: 238 10*3/uL (ref 150–400)
RBC: 2.84 MIL/uL — ABNORMAL LOW (ref 4.22–5.81)
RDW: 14 % (ref 11.5–15.5)
WBC: 12.7 10*3/uL — ABNORMAL HIGH (ref 4.0–10.5)
nRBC: 0 % (ref 0.0–0.2)

## 2020-07-07 LAB — RESP PANEL BY RT-PCR (FLU A&B, COVID) ARPGX2
Influenza A by PCR: NEGATIVE
Influenza B by PCR: NEGATIVE
SARS Coronavirus 2 by RT PCR: NEGATIVE

## 2020-07-07 LAB — ABO/RH: ABO/RH(D): A POS

## 2020-07-07 LAB — PREPARE RBC (CROSSMATCH)

## 2020-07-07 LAB — POC OCCULT BLOOD, ED: Fecal Occult Bld: POSITIVE — AB

## 2020-07-07 LAB — HEMOGLOBIN AND HEMATOCRIT, BLOOD
HCT: 23 % — ABNORMAL LOW (ref 39.0–52.0)
Hemoglobin: 7.1 g/dL — ABNORMAL LOW (ref 13.0–17.0)

## 2020-07-07 LAB — PROTIME-INR
INR: 1.1 (ref 0.8–1.2)
Prothrombin Time: 14.4 seconds (ref 11.4–15.2)

## 2020-07-07 MED ORDER — SODIUM CHLORIDE 0.9 % IV SOLN: INTRAVENOUS | Status: AC

## 2020-07-07 MED ORDER — IOHEXOL 350 MG/ML SOLN
100.0000 mL | Freq: Once | INTRAVENOUS | Status: AC | PRN
  Administered 2020-07-07: 100 mL via INTRAVENOUS

## 2020-07-07 MED ORDER — PANTOPRAZOLE SODIUM 40 MG IV SOLR
40.0000 mg | INTRAVENOUS | Status: DC
  Administered 2020-07-07 – 2020-07-08 (×2): 40 mg via INTRAVENOUS
  Filled 2020-07-07 (×2): qty 40

## 2020-07-07 MED ORDER — BISACODYL 5 MG PO TBEC
20.0000 mg | DELAYED_RELEASE_TABLET | Freq: Once | ORAL | Status: AC
  Administered 2020-07-07: 20 mg via ORAL
  Filled 2020-07-07: qty 4

## 2020-07-07 MED ORDER — SODIUM CHLORIDE 0.9% IV SOLUTION: Freq: Once | INTRAVENOUS | Status: AC

## 2020-07-07 MED ORDER — PEG-KCL-NACL-NASULF-NA ASC-C 100 G PO SOLR
0.5000 | Freq: Once | ORAL | Status: AC
  Administered 2020-07-07: 100 g via ORAL
  Filled 2020-07-07: qty 1

## 2020-07-07 MED ORDER — PEG-KCL-NACL-NASULF-NA ASC-C 100 G PO SOLR
0.5000 | Freq: Once | ORAL | Status: AC
  Administered 2020-07-08: 100 g via ORAL
  Filled 2020-07-07: qty 1

## 2020-07-07 MED ORDER — LACTATED RINGERS IV SOLN: INTRAVENOUS | Status: DC

## 2020-07-07 MED ORDER — POTASSIUM CHLORIDE CRYS ER 20 MEQ PO TBCR
40.0000 meq | EXTENDED_RELEASE_TABLET | Freq: Once | ORAL | Status: AC
  Administered 2020-07-07: 40 meq via ORAL
  Filled 2020-07-07: qty 2

## 2020-07-07 MED ORDER — HYDRALAZINE HCL 20 MG/ML IJ SOLN: 10.0000 mg | INTRAMUSCULAR | Status: DC | PRN

## 2020-07-07 MED ORDER — POTASSIUM CHLORIDE 10 MEQ/100ML IV SOLN: 10.0000 meq | INTRAVENOUS | Status: DC

## 2020-07-07 MED ORDER — PEG-KCL-NACL-NASULF-NA ASC-C 100 G PO SOLR: 1.0000 | Freq: Once | ORAL | Status: DC

## 2020-07-07 NOTE — ED Notes (Signed)
Tried to call report. No answer to 5W. Will call back in a few minutes

## 2020-07-07 NOTE — ED Provider Notes (Signed)
[facility] EMERGENCY DEPARTMENT Provider Note   CSN: [id number] Arrival date & time: 07/07/20  1008     History Chief Complaint  Patient presents with  . Rectal Bleeding    Ryan Mosley is a 80 y.o. male with PMH of hypertension, hyperlipidemia, diabetes, internal hemorrhoids and BPH who presents to the ED via EMS with a complaint of bright red blood bleeding. Patient reports he has had blood in his stool on and off for the past year. Last Friday, he had multiple episodes of bloody stool (3 times) causing him to become dizzy.  His dizziness resolved after he was sat down for a few minutes. This morning, patient has had 3 episodes of bright red rectal bleeding.  He again became dizzy.  He reports some associated rectal itching and blurry vision but denies any abdominal pain, nausea, loss of appetite, fever, back pain, chest pain, hematuria or hemoptysis.  Patient reports he uses NSAIDs occasionally but not often.  He is not on any blood thinners.      Past Medical History:  Diagnosis Date  . Diabetes mellitus without complication ([place])   . Hyperlipidemia   . Hypertension     Patient Active Problem List   Diagnosis Date Noted  . BPH (benign prostatic hyperplasia) 09/17/2019  . Primary open angle glaucoma (POAG) of both eyes 03/19/2019  . Allergic rhinitis 09/11/2018  . Hyperlipemia 05/29/2017  . Hypertension 05/27/2017  . Diabetes mellitus without complication ([place]) 46/27/0350    No past surgical history on file.     Family History  Problem Relation Age of Onset  . Seizures Daughter   . Diabetes Son     Social History   Tobacco Use  . Smoking status: Former Research scientist (life sciences)  . Smokeless tobacco: Current User    Types: Chew  Substance Use Topics  . Alcohol use: Yes    Alcohol/week: 1.0 standard drink    Types: 1 Cans of beer per week  . Drug use: Never    Home Medications Prior to Admission medications   Medication Sig Start Date End Date Taking?  Authorizing Provider  amLODipine (NORVASC) 5 MG tablet TAKE 1 TABLET BY MOUTH EVERY DAY FOR BLOOD PRESSURE 03/17/20   [place], Modena Nunnery, MD  latanoprost (XALATAN) 0.005 % ophthalmic solution INSTILL 1 DROP INTO BOTH EYES EVERY DAY AT NIGHT 06/13/17   [provider]  lisinopril (ZESTRIL) 40 MG tablet Take 1 tablet (40 mg total) by mouth daily. 03/17/20   Alycia Rossetti, MD  rosuvastatin (CRESTOR) 5 MG tablet TAKE 1 TABLET BY MOUTH ONCE A WEEK FOR CHOLESTEROL 03/17/20   Alycia Rossetti, MD  tamsulosin (FLOMAX) 0.4 MG CAPS capsule TAKE 1 CAPSULE BY MOUTH EVERY DAY 06/13/20   Alycia Rossetti, MD    Allergies    Patient has no known allergies.  Review of Systems   Review of Systems  Constitutional: Negative for fever.  Eyes: Positive for visual disturbance.  Respiratory: Negative for shortness of breath.   Cardiovascular: Negative for chest pain and palpitations.  Gastrointestinal: Positive for blood in stool. Negative for abdominal pain, diarrhea, nausea, rectal pain and vomiting.       Rectal itching  Genitourinary: Negative for difficulty urinating and hematuria.  Neurological: Positive for dizziness and light-headedness. Negative for syncope, weakness and numbness.  Hematological: Does not bruise/bleed easily.    Physical Exam Updated Vital Signs BP 128/66   Pulse 78   Temp 98.4 F (36.9 C) (Oral)   Resp  15   Ht 6' (1.829 m)   Wt 83.9 kg   SpO2 100%   BMI 25.09 kg/m   Physical Exam Constitutional:      General: He is not in acute distress.    Appearance: Normal appearance. He is not toxic-appearing.  HENT:     Mouth/Throat:     Mouth: Mucous membranes are moist.  Eyes:     Extraocular Movements: Extraocular movements intact.  Cardiovascular:     Rate and Rhythm: Normal rate and regular rhythm.     Pulses: Normal pulses.     Heart sounds: Normal heart sounds.  Pulmonary:     Effort: Pulmonary effort is normal. No respiratory distress.     Breath sounds:  Normal breath sounds.  Abdominal:     General: Abdomen is flat. Bowel sounds are normal. There is no distension.     Palpations: Abdomen is soft.     Tenderness: There is no abdominal tenderness.  Genitourinary:    Rectum: Guaiac result positive.     Comments: Rectal exam showed small lump in the internal aspect of rectum, no anal fissures or skin tags Musculoskeletal:        General: Normal range of motion.  Skin:    General: Skin is warm and dry.     Capillary Refill: Capillary refill takes 2 to 3 seconds.  Neurological:     General: No focal deficit present.     Mental Status: He is alert and oriented to person, place, and time.  Psychiatric:        Mood and Affect: Mood normal.     ED Results / Procedures / Treatments   Labs (all labs ordered are listed, but only abnormal results are displayed) Labs Reviewed  COMPREHENSIVE METABOLIC PANEL - Abnormal; Notable for the following components:      Result Value   Potassium 3.1 (*)    Glucose, Bld 237 (*)    Calcium 8.3 (*)    Total Protein 6.0 (*)    Albumin 3.3 (*)    All other components within normal limits  CBC - Abnormal; Notable for the following components:   WBC 12.7 (*)    RBC 2.84 (*)    Hemoglobin 7.9 (*)    HCT 26.1 (*)    All other components within normal limits  POC OCCULT BLOOD, ED - Abnormal; Notable for the following components:   Fecal Occult Bld POSITIVE (*)    All other components within normal limits  PROTIME-INR  TYPE AND SCREEN    EKG None  Radiology No results found.  Procedures Procedures   Medications Ordered in ED Medications  lactated ringers infusion ( Intravenous New Bag/Given 07/07/20 1227)    ED Course  I have reviewed the triage vital signs and the nursing notes.  Pertinent labs & imaging results that were available during my care of the patient were reviewed by me and considered in my medical decision making (see chart for details).    MDM Rules/Calculators/A&P                          Patient with a history of internal hemorrhoids here for evaluation of GI bleed.  Patient reports bright red blood x3 this morning with some associated dizziness. Patient found to be hypotensive initially by EMS but now normotensive after fluid resuscitation. Rectal exam positive for blood and internal hemorrhoids. Patient hemodynamically stable. CBC significant for mild leukocytosis and hemoglobin of 7.9  from 15.2 three months ago. CMP significant for mild hypokalemia. Patient's presentation likely secondary to lower GI bleed in the setting of his internal hemorrhoids. Patient will benefit from further evaluation from GI for possible colonoscopy/endoscopy in the setting of significant drop in hemoglobin over the last 3 months. GI consulted and they plan to see patient.  Pending CT angio abdomen/pelvis.   Patient admitted to hospitalist service for further management of his GI bleed.  Final Clinical Impression(s) / ED Diagnoses Final diagnoses:  Gastrointestinal hemorrhage, unspecified gastrointestinal hemorrhage type    Rx / DC Orders ED Discharge Orders    None       Lacinda Axon, MD 07/07/20 1309    Lucrezia Starch, MD 07/08/20 1134

## 2020-07-07 NOTE — ED Notes (Signed)
Informed consent obtained 

## 2020-07-07 NOTE — ED Triage Notes (Signed)
Pt brought to ED via EMS from home with c/o bright red rectal bleeding when using the bathroom x 3 days. Patient denies pain, endorses rectal itchiness. EMS reports patient's BP was initially 90/72, increased to 135/80 after 200 mL NS. Alert and oriented x 4 on arrival to ED, VSS, NAD.  EMS v/s: 119 cbg 95% on room air 16 RR 96 pulse

## 2020-07-07 NOTE — H&P (Signed)
History and Physical    Broady Lafoy EGB:[id number] DOB: [birth year] DOA: 07/07/2020  PCP: Susy Frizzle, MD    Patient coming from:  Home   Chief Complaint:  Bright red blood per rectum   HPI: Ryan Mosley is a 80 y.o. male seen in ed with complaints of bright red blood per rectum that started this morning.  Patient had 3 episodes of bloody stools this morning, was associated with dizziness.  Since coming to the emergency room patient has not had any more bloody stools.  Patient reports that he has been bleeding since the 13th.  No abdominal pain cramping tenesmus, nausea or vomiting.  No blood thinners or history of GI bleed, no NSAID history.  Patient does take occasional cough and cold medications.  Patient also does not report any chest pain or any other complaints otherwise.  Pt has past medical history of diabetes mellitus type 2, hypertension, hyperlipidemia, glaucoma ED Course:  Vitals:   07/07/20 1015 07/07/20 1100 07/07/20 1200 07/07/20 1300  BP: 140/66 128/66 140/73 110/68  Pulse: 95 78 62 74  Resp: 16 15 (!) 21 14  Temp: 98.4 F (36.9 C)     TempSrc: Oral     SpO2: 100% 100% 100% 100%  Weight: 83.9 kg     Height: 6' (1.829 m)     In the emergency room patient is alert awake afebrile, oriented, seen by GI we will order a CT angio of the abdomen and pelvis with contrast for identifying active bleed.  GI consult was requested by EDMD for acute GI bleed.  CMP shows mild hypokalemia with a potassium of 3.1, glucose of 237, calcium of 8.3.3, EGFR is more than 60 with a creatinine of 1.09. CBC shows an elevated white count of 12.7, decrease in the hemoglobin from 15.2-7.9. Stool guaiac done in the emergency room..  Review of Systems:  Review of Systems  Gastrointestinal: Positive for blood in stool. Negative for abdominal pain, constipation, diarrhea, heartburn, melena, nausea and vomiting.  All other systems reviewed and are negative.    Past Medical History:   Diagnosis Date  . Acute blood loss anemia   . Diabetes mellitus without complication ([place])   . GI bleed   . Hyperlipidemia   . Hypertension     History reviewed. No pertinent surgical history.   reports that he has quit smoking. His smokeless tobacco use includes chew. He reports current alcohol use of about 1.0 standard drink of alcohol per week. He reports that he does not use drugs.  No Known Allergies  Family History  Problem Relation Age of Onset  . Seizures Daughter   . Diabetes Son     Prior to Admission medications   Medication Sig Start Date End Date Taking? Authorizing Provider  acetaminophen (TYLENOL) 500 MG tablet Take 500 mg by mouth every morning.   Yes [provider]  amLODipine (NORVASC) 5 MG tablet TAKE 1 TABLET BY MOUTH EVERY DAY FOR BLOOD PRESSURE Patient taking differently: Take 5 mg by mouth daily. 03/17/20  Yes [place], Modena Nunnery, MD  latanoprost (XALATAN) 0.005 % ophthalmic solution Place 1 drop into both eyes at bedtime. 06/13/17  Yes [provider]  lisinopril (ZESTRIL) 40 MG tablet Take 1 tablet (40 mg total) by mouth daily. 03/17/20  Yes [place], Modena Nunnery, MD  Pseudoephedrine-Ibuprofen (ADVIL COLD/SINUS PO) Take 1 tablet by mouth daily as needed (sinus relief).   Yes [provider]  rosuvastatin (CRESTOR) 5 MG tablet TAKE 1 TABLET  BY MOUTH ONCE A WEEK FOR CHOLESTEROL Patient taking differently: Take 5 mg by mouth once a week. Mondays 03/17/20  Yes [place], Modena Nunnery, MD  tamsulosin (FLOMAX) 0.4 MG CAPS capsule TAKE 1 CAPSULE BY MOUTH EVERY DAY Patient not taking: Reported on 07/07/2020 06/13/20   Alycia Rossetti, MD    Physical Exam: Vitals:   07/07/20 1015 07/07/20 1100 07/07/20 1200 07/07/20 1300  BP: 140/66 128/66 140/73 110/68  Pulse: 95 78 62 74  Resp: 16 15 (!) 21 14  Temp: 98.4 F (36.9 C)     TempSrc: Oral     SpO2: 100% 100% 100% 100%  Weight: 83.9 kg     Height: 6' (1.829 m)      Physical Exam Vitals and  nursing note reviewed.  Constitutional:      General: He is not in acute distress.    Appearance: He is not ill-appearing or toxic-appearing.  HENT:     Head: Normocephalic and atraumatic.     Right Ear: External ear normal.     Left Ear: External ear normal.     Nose: Nose normal.     Mouth/Throat:     Mouth: Mucous membranes are moist.  Eyes:     Extraocular Movements: Extraocular movements intact.     Pupils: Pupils are equal, round, and reactive to light.  Neck:     Vascular: No carotid bruit.  Cardiovascular:     Rate and Rhythm: Normal rate and regular rhythm.     Pulses: Normal pulses.     Heart sounds: Normal heart sounds.  Pulmonary:     Effort: Pulmonary effort is normal.     Breath sounds: Normal breath sounds.  Abdominal:     General: Bowel sounds are normal. There is no distension.     Palpations: Abdomen is soft.     Tenderness: There is no abdominal tenderness. There is no guarding.  Musculoskeletal:     Right lower leg: No edema.     Left lower leg: No edema.  Skin:    General: Skin is warm.  Neurological:     General: No focal deficit present.     Mental Status: He is alert and oriented to person, place, and time.  Psychiatric:        Mood and Affect: Mood normal.        Behavior: Behavior normal.   Labs on Admission: I have personally reviewed following labs and imaging studies  No results for input(s): CKTOTAL, CKMB, TROPONINI in the last 72 hours. Lab Results  Component Value Date   WBC 12.7 (H) 07/07/2020   HGB 7.1 (L) 07/07/2020   HCT 23.0 (L) 07/07/2020   MCV 91.9 07/07/2020   PLT 238 07/07/2020    Recent Labs  Lab 07/07/20 1024  NA 137  K 3.1*  CL 105  CO2 26  BUN 10  CREATININE 1.09  CALCIUM 8.3*  PROT 6.0*  BILITOT 0.4  ALKPHOS 56  ALT 17  AST 20  GLUCOSE 237*   Lab Results  Component Value Date   CHOL 208 (H) 03/17/2020   HDL 44 03/17/2020   LDLCALC 142 (H) 03/17/2020   TRIG 104 03/17/2020   No results found for:  DDIMER Invalid input(s): POCBNP  Urinalysis    Component Value Date/Time   COLORURINE YELLOW 09/17/2019 0850   APPEARANCEUR CLEAR 09/17/2019 0850   LABSPEC 1.020 09/17/2019 [place] 7.0 09/17/2019 0850   GLUCOSEU NEGATIVE 09/17/2019 0850   HGBUR TRACE (  A) 09/17/2019 0850   KETONESUR NEGATIVE 09/17/2019 0850   PROTEINUR 2+ (A) 09/17/2019 0850   NITRITE NEGATIVE 09/17/2019 0850   LEUKOCYTESUR NEGATIVE 09/17/2019 0850     Radiological Exams on Admission: No results found.  EKG: Independently reviewed.  None.  Assessment/Plan Principal Problem:   GI bleed Active Problems:   Acute blood loss anemia   Hypertension   Diabetes mellitus without complication (HCC)   Primary open angle glaucoma (POAG) of both eyes   BPH (benign prostatic hyperplasia) GIB/ A/C blood loss anemia: Admit to progressive unit with continuous cardiac monitoring.  CTA abd and pelvis pending. GI on board and appreciate management and care.  Transfuse 2 units as hb is 7.1 Hold all antiplatelet and npo after midnight.    HTN: Blood pressure 110/68, pulse 74, temperature 98.4 F (36.9 C), temperature source Oral, resp. rate 14, height 6' (1.829 m), weight 83.9 kg, SpO2 100 %. BP is low but stable, will continue patient on lisinopril, amlodipine..  Diabetes mellitus without complication: Home meds do not show home meds for diabetes mellitus type 2. Sliding scale insulin, A1c.  Primary open-angle glaucoma: Continue latanoprost both eyes.  BPH: Continue patient on max 0.4 mgrams daily.  DVT prophylaxis:  SCD'd/ Heparin   Code Status:  Full code   Family Communication:  Amborn,corene (Spouse)  [phone] (Home Phone)  Disposition Plan:  Home   Consults called:  GI  Admission status: Inpatient     Para Skeans MD [facility] [phone] How to contact the [facility] Attending or Consulting provider [place] or covering provider during after hours [place], for this patient.     1. Check the care team in [facility] and look for a) attending/consulting [place] provider listed and b) the [facility] team listed 2. Log into [email or web address] and use [place]'s universal password to access. If you do not have the password, please contact the hospital operator. 3. Locate the [facility] provider you are looking for under [facility] and page to a number that you can be directly reached. 4. If you still have difficulty reaching the provider, please page the [facility] (Director on Call) for the Hospitalists listed on amion for assistance. [email or web address] Password [facility] 07/07/2020, 1:29 PM

## 2020-07-07 NOTE — ED Notes (Signed)
Pt was receiving blood when I cam to Draw his Hgb. Will get that lab at a later time.

## 2020-07-07 NOTE — Consult Note (Signed)
Consultation  Referring Provider:  Roslynn Amble MD Primary Care Physician:  Susy Frizzle, MD Primary Gastroenterologist: None/unassigned.  Reason for Consultation: Acute GI bleed, bright red blood per rectum  HPI: Ryan Mosley is a 80 y.o. male, who we are asked to see for acute GI bleed.  Patient was brought to the emergency room this morning via EMS from home after 3 episodes of what sounds like large-volume bright red blood per rectum this morning.  Patient says this is associated with weakness and dizziness.  He had urgency for bowel movement x1 since arrival in the ER but has not passed any more blood or stool. Patient says he initially had onset of bleeding on Friday, 07/04/2020.  He says he had 3 episodes of fairly large volume bright red blood per rectum which was also associated with lightheadedness and dizziness which he said lasted for 3 to 4 hours after the bleeding.  He did not have any further bleeding or bowel movements on Saturday or Sunday and had recurrence as above this morning. He has not had any associated abdominal pain or cramping, no nausea or vomiting, no recent changes in bowel habits. He is not on any blood thinners, no prior history of GI bleeding and no prior GI evaluation.  Specifically patient says he has never had colonoscopy.  He has been taking a cold and sinus tablet daily over the past couple of months does not know whether or not this contains aspirin.  Family history negative for colon cancer as far as he is aware.  Review of labs shows that hemoglobin was 15.2 in January 2022  On arrival to ER today hemoglobin 7.9/hematocrit 26.1, WBC 12.7/INR 1.1 BUN 10/creatinine 1.09, potassium 3.1, glucose 237.   Past Medical History:  Diagnosis Date  . Diabetes mellitus without complication ([place])   . Hyperlipidemia   . Hypertension     No past surgical history on file.  Prior to Admission medications   Medication Sig Start Date End Date Taking?  Authorizing Provider  acetaminophen (TYLENOL) 500 MG tablet Take 500 mg by mouth every morning.   Yes [provider]  amLODipine (NORVASC) 5 MG tablet TAKE 1 TABLET BY MOUTH EVERY DAY FOR BLOOD PRESSURE Patient taking differently: Take 5 mg by mouth daily. 03/17/20  Yes [place], Modena Nunnery, MD  latanoprost (XALATAN) 0.005 % ophthalmic solution Place 1 drop into both eyes at bedtime. 06/13/17  Yes [provider]  lisinopril (ZESTRIL) 40 MG tablet Take 1 tablet (40 mg total) by mouth daily. 03/17/20  Yes [place], Modena Nunnery, MD  Pseudoephedrine-Ibuprofen (ADVIL COLD/SINUS PO) Take 1 tablet by mouth daily as needed (sinus relief).   Yes [provider]  rosuvastatin (CRESTOR) 5 MG tablet TAKE 1 TABLET BY MOUTH ONCE A WEEK FOR CHOLESTEROL Patient taking differently: Take 5 mg by mouth once a week. Mondays 03/17/20  Yes [place], Modena Nunnery, MD  tamsulosin (FLOMAX) 0.4 MG CAPS capsule TAKE 1 CAPSULE BY MOUTH EVERY DAY Patient not taking: Reported on 07/07/2020 06/13/20   Alycia Rossetti, MD    Current Facility-Administered Medications  Medication Dose Route Frequency Provider Last Rate Last Admin  . lactated ringers infusion   Intravenous Continuous Lacinda Axon, MD       Current Outpatient Medications  Medication Sig Dispense Refill  . acetaminophen (TYLENOL) 500 MG tablet Take 500 mg by mouth every morning.    Marland Kitchen amLODipine (NORVASC) 5 MG tablet TAKE 1 TABLET BY MOUTH EVERY DAY FOR BLOOD  PRESSURE (Patient taking differently: Take 5 mg by mouth daily.) 90 tablet 2  . latanoprost (XALATAN) 0.005 % ophthalmic solution Place 1 drop into both eyes at bedtime.  3  . lisinopril (ZESTRIL) 40 MG tablet Take 1 tablet (40 mg total) by mouth daily. 90 tablet 2  . Pseudoephedrine-Ibuprofen (ADVIL COLD/SINUS PO) Take 1 tablet by mouth daily as needed (sinus relief).    . rosuvastatin (CRESTOR) 5 MG tablet TAKE 1 TABLET BY MOUTH ONCE A WEEK FOR CHOLESTEROL (Patient taking differently:  Take 5 mg by mouth once a week. Mondays) 12 tablet 2  . tamsulosin (FLOMAX) 0.4 MG CAPS capsule TAKE 1 CAPSULE BY MOUTH EVERY DAY (Patient not taking: Reported on 07/07/2020) 90 capsule 0    Allergies as of 07/07/2020  . (No Known Allergies)    Family History  Problem Relation Age of Onset  . Seizures Daughter   . Diabetes Son     Social History   Socioeconomic History  . Marital status: Married    Spouse name: Not on file  . Number of children: Not on file  . Years of education: Not on file  . Highest education level: Not on file  Occupational History  . Not on file  Tobacco Use  . Smoking status: Former Research scientist (life sciences)  . Smokeless tobacco: Current User    Types: Chew  Substance and Sexual Activity  . Alcohol use: Yes    Alcohol/week: 1.0 standard drink    Types: 1 Cans of beer per week  . Drug use: Never  . Sexual activity: Not on file  Other Topics Concern  . Not on file  Social History Narrative  . Not on file   Social Determinants of Health   Financial Resource Strain: Low Risk   . Difficulty of Paying Living Expenses: Not very hard  Food Insecurity: Not on file  Transportation Needs: Not on file  Physical Activity: Not on file  Stress: Not on file  Social Connections: Not on file  Intimate Partner Violence: Not on file    Review of Systems: Pertinent positive and negative review of systems were noted in the above HPI section.  All other review of systems was otherwise negative.  Physical Exam: Vital signs in last 24 hours: Temp:  [98.4 F (36.9 C)] 98.4 F (36.9 C) (05/16 1015) Pulse Rate:  [78-95] 78 (05/16 1100) Resp:  [15-16] 15 (05/16 1100) BP: (128-140)/(66) 128/66 (05/16 1100) SpO2:  [100 %] 100 % (05/16 1100) Weight:  [83.9 kg] 83.9 kg (05/16 1015)   General:   Alert,  Well-developed, well-nourished elderly male, pleasant and cooperative in NAD Head:  Normocephalic and atraumatic. Eyes:  Sclera clear, no icterus.   Conjunctiva pink. Ears:   Normal auditory acuity. Nose:  No deformity, discharge,  or lesions. Mouth:  No deformity or lesions.   Neck:  Supple; no masses or thyromegaly. Lungs:  Clear throughout to auscultation.   No wheezes, crackles, or rhonchi. Heart:  Regular rate and rhythm; no murmurs, clicks, rubs,  or gallops. Abdomen:  Soft,nontender, BS active,nonpalp mass or hsm.   Rectal: Documented heme positive on arrival Msk:  Symmetrical without gross deformities. . Pulses:  Normal pulses noted. Extremities:  Without clubbing or edema. Neurologic:  Alert and  oriented x4;  grossly normal neurologically. Skin:  Intact without significant lesions or rashes.. Psych:  Alert and cooperative. Normal mood and affect.  Intake/Output from previous day: No intake/output data recorded. Intake/Output this shift: No intake/output data recorded.  Lab Results:  Recent Labs    07/07/20 1024  WBC 12.7*  HGB 7.9*  HCT 26.1*  PLT 238   BMET Recent Labs    07/07/20 1024  NA 137  K 3.1*  CL 105  CO2 26  GLUCOSE 237*  BUN 10  CREATININE 1.09  CALCIUM 8.3*   LFT Recent Labs    07/07/20 1024  PROT 6.0*  ALBUMIN 3.3*  AST 20  ALT 17  ALKPHOS 56  BILITOT 0.4   PT/INR Recent Labs    07/07/20 1037  LABPROT 14.4  INR 1.1   Hepatitis Panel No results for input(s): HEPBSAG, HCVAB, HEPAIGM, HEPBIGM in the last 72 hours.    IMPRESSION:  #44 80 year old male with acute lower GI bleed, onset 4 days ago with 3 episodes of grossly bloody stool.  Bleeding resolved and then resumed again this morning with 3 more episodes of grossly bloody stool. Bleeding has been painless, but associated with lightheadedness and dizziness.  Patient has had 7 g drop in hemoglobin since January 2022, normocytic  Suspect acute diverticular bleed, rule out bleeding secondary to neoplasm, AVM versus other inflammatory process  #2 hyperlipidemia 3.  Adult onset diabetes mellitus 4.  BPH  Plan; keep n.p.o. except ice chips for  now Serial hemoglobins and transfuse for hemoglobin 7 or less.  This was discussed with the patient today IV PPI once daily Patient will be scheduled for CT angio abdomen and pelvis stat-the recommendations pending results. GI will follow with you     Amzie Sillas PA-C 07/07/2020, 12:12 PM

## 2020-07-07 NOTE — H&P (View-Only) (Signed)
  Consultation  Referring Provider:  Dykstra MD Primary Care Physician:  Pickard, Warren T, MD Primary Gastroenterologist: None/unassigned.  Reason for Consultation: Acute GI bleed, bright red blood per rectum  HPI: Ryan Mosley is a 80 y.o. male, who we are asked to see for acute GI bleed.  Patient was brought to the emergency room this morning via EMS from home after 3 episodes of what sounds like large-volume bright red blood per rectum this morning.  Patient says this is associated with weakness and dizziness.  He had urgency for bowel movement x1 since arrival in the ER but has not passed any more blood or stool. Patient says he initially had onset of bleeding on Friday, 07/04/2020.  He says he had 3 episodes of fairly large volume bright red blood per rectum which was also associated with lightheadedness and dizziness which he said lasted for 3 to 4 hours after the bleeding.  He did not have any further bleeding or bowel movements on Saturday or Sunday and had recurrence as above this morning. He has not had any associated abdominal pain or cramping, no nausea or vomiting, no recent changes in bowel habits. He is not on any blood thinners, no prior history of GI bleeding and no prior GI evaluation.  Specifically patient says he has never had colonoscopy.  He has been taking a cold and sinus tablet daily over the past couple of months does not know whether or not this contains aspirin.  Family history negative for colon cancer as far as he is aware.  Review of labs shows that hemoglobin was 15.2 in January 2022  On arrival to ER today hemoglobin 7.9/hematocrit 26.1, WBC 12.7/INR 1.1 BUN 10/creatinine 1.09, potassium 3.1, glucose 237.   Past Medical History:  Diagnosis Date  . Diabetes mellitus without complication (HCC)   . Hyperlipidemia   . Hypertension     No past surgical history on file.  Prior to Admission medications   Medication Sig Start Date End Date Taking?  Authorizing Provider  acetaminophen (TYLENOL) 500 MG tablet Take 500 mg by mouth every morning.   Yes [provider]  amLODipine (NORVASC) 5 MG tablet TAKE 1 TABLET BY MOUTH EVERY DAY FOR BLOOD PRESSURE Patient taking differently: Take 5 mg by mouth daily. 03/17/20  Yes P[place]gah, Kawanta F, MD  latanoprost (XALATAN) 0.005 % ophthalmic solution Place 1 drop into both eyes at bedtime. 06/13/17  Yes [provider]  lisinopril (ZESTRIL) 40 MG tablet Take 1 tablet (40 mg total) by mouth daily. 03/17/20  Yes [place]-Leto, Kawanta F, MD  Pseudoephedrine-Ibuprofen (ADVIL COLD/SINUS PO) Take 1 tablet by mouth daily as needed (sinus relief).   Yes [provider]  rosuvastatin (CRESTOR) 5 MG tablet TAKE 1 TABLET BY MOUTH ONCE A WEEK FOR CHOLESTEROL Patient taking differently: Take 5 mg by mouth once a week. Mondays 03/17/20  Yes Grasston, Kawanta F, MD  tamsulosin (FLOMAX) 0.4 MG CAPS capsule TAKE 1 CAPSULE BY MOUTH EVERY DAY Patient not taking: Reported on 07/07/2020 06/13/20   [place], Kawanta F, MD    Current Facility-Administered Medications  Medication Dose Route Frequency Provider Last Rate Last Admin  . lactated ringers infusion   Intravenous Continuous Amponsah, Prosper M, MD       Current Outpatient Medications  Medication Sig Dispense Refill  . acetaminophen (TYLENOL) 500 MG tablet Take 500 mg by mouth every morning.    . amLODipine (NORVASC) 5 MG tablet TAKE 1 TABLET BY MOUTH EVERY DAY FOR BLOOD   PRESSURE (Patient taking differently: Take 5 mg by mouth daily.) 90 tablet 2  . latanoprost (XALATAN) 0.005 % ophthalmic solution Place 1 drop into both eyes at bedtime.  3  . lisinopril (ZESTRIL) 40 MG tablet Take 1 tablet (40 mg total) by mouth daily. 90 tablet 2  . Pseudoephedrine-Ibuprofen (ADVIL COLD/SINUS PO) Take 1 tablet by mouth daily as needed (sinus relief).    . rosuvastatin (CRESTOR) 5 MG tablet TAKE 1 TABLET BY MOUTH ONCE A WEEK FOR CHOLESTEROL (Patient taking differently:  Take 5 mg by mouth once a week. Mondays) 12 tablet 2  . tamsulosin (FLOMAX) 0.4 MG CAPS capsule TAKE 1 CAPSULE BY MOUTH EVERY DAY (Patient not taking: Reported on 07/07/2020) 90 capsule 0    Allergies as of 07/07/2020  . (No Known Allergies)    Family History  Problem Relation Age of Onset  . Seizures Daughter   . Diabetes Son     Social History   Socioeconomic History  . Marital status: Married    Spouse name: Not on file  . Number of children: Not on file  . Years of education: Not on file  . Highest education level: Not on file  Occupational History  . Not on file  Tobacco Use  . Smoking status: Former Research scientist (life sciences)  . Smokeless tobacco: Current User    Types: Chew  Substance and Sexual Activity  . Alcohol use: Yes    Alcohol/week: 1.0 standard drink    Types: 1 Cans of beer per week  . Drug use: Never  . Sexual activity: Not on file  Other Topics Concern  . Not on file  Social History Narrative  . Not on file   Social Determinants of Health   Financial Resource Strain: Low Risk   . Difficulty of Paying Living Expenses: Not very hard  Food Insecurity: Not on file  Transportation Needs: Not on file  Physical Activity: Not on file  Stress: Not on file  Social Connections: Not on file  Intimate Partner Violence: Not on file    Review of Systems: Pertinent positive and negative review of systems were noted in the above HPI section.  All other review of systems was otherwise negative.  Physical Exam: Vital signs in last 24 hours: Temp:  [98.4 F (36.9 C)] 98.4 F (36.9 C) (05/16 1015) Pulse Rate:  [78-95] 78 (05/16 1100) Resp:  [15-16] 15 (05/16 1100) BP: (128-140)/(66) 128/66 (05/16 1100) SpO2:  [100 %] 100 % (05/16 1100) Weight:  [83.9 kg] 83.9 kg (05/16 1015)   General:   Alert,  Well-developed, well-nourished elderly male, pleasant and cooperative in NAD Head:  Normocephalic and atraumatic. Eyes:  Sclera clear, no icterus.   Conjunctiva pink. Ears:   Normal auditory acuity. Nose:  No deformity, discharge,  or lesions. Mouth:  No deformity or lesions.   Neck:  Supple; no masses or thyromegaly. Lungs:  Clear throughout to auscultation.   No wheezes, crackles, or rhonchi. Heart:  Regular rate and rhythm; no murmurs, clicks, rubs,  or gallops. Abdomen:  Soft,nontender, BS active,nonpalp mass or hsm.   Rectal: Documented heme positive on arrival Msk:  Symmetrical without gross deformities. . Pulses:  Normal pulses noted. Extremities:  Without clubbing or edema. Neurologic:  Alert and  oriented x4;  grossly normal neurologically. Skin:  Intact without significant lesions or rashes.. Psych:  Alert and cooperative. Normal mood and affect.  Intake/Output from previous day: No intake/output data recorded. Intake/Output this shift: No intake/output data recorded.  Lab Results:  Recent Labs    07/07/20 1024  WBC 12.7*  HGB 7.9*  HCT 26.1*  PLT 238   BMET Recent Labs    07/07/20 1024  NA 137  K 3.1*  CL 105  CO2 26  GLUCOSE 237*  BUN 10  CREATININE 1.09  CALCIUM 8.3*   LFT Recent Labs    07/07/20 1024  PROT 6.0*  ALBUMIN 3.3*  AST 20  ALT 17  ALKPHOS 56  BILITOT 0.4   PT/INR Recent Labs    07/07/20 1037  LABPROT 14.4  INR 1.1   Hepatitis Panel No results for input(s): HEPBSAG, HCVAB, HEPAIGM, HEPBIGM in the last 72 hours.    IMPRESSION:  #1 80-year-old male with acute lower GI bleed, onset 4 days ago with 3 episodes of grossly bloody stool.  Bleeding resolved and then resumed again this morning with 3 more episodes of grossly bloody stool. Bleeding has been painless, but associated with lightheadedness and dizziness.  Patient has had 7 g drop in hemoglobin since January 2022, normocytic  Suspect acute diverticular bleed, rule out bleeding secondary to neoplasm, AVM versus other inflammatory process  #2 hyperlipidemia 3.  Adult onset diabetes mellitus 4.  BPH  Plan; keep n.p.o. except ice chips for  now Serial hemoglobins and transfuse for hemoglobin 7 or less.  This was discussed with the patient today IV PPI once daily Patient will be scheduled for CT angio abdomen and pelvis stat-the recommendations pending results. GI will follow with you     Clova Morlock PA-C 07/07/2020, 12:12 PM   

## 2020-07-08 ENCOUNTER — Inpatient Hospital Stay (HOSPITAL_COMMUNITY): Payer: Medicare Other | Admitting: Anesthesiology

## 2020-07-08 ENCOUNTER — Inpatient Hospital Stay (HOSPITAL_COMMUNITY): Payer: Medicare Other

## 2020-07-08 ENCOUNTER — Encounter (HOSPITAL_COMMUNITY)
Admission: EM | Disposition: A | Discharge status: Home / Self Care | Payer: Self-pay | Source: Home / Self Care | Attending: Internal Medicine

## 2020-07-08 ENCOUNTER — Encounter (HOSPITAL_COMMUNITY): Payer: Self-pay | Admitting: Internal Medicine

## 2020-07-08 DIAGNOSIS — R350 Frequency of micturition: Secondary | ICD-10-CM

## 2020-07-08 DIAGNOSIS — N401 Enlarged prostate with lower urinary tract symptoms: Secondary | ICD-10-CM

## 2020-07-08 DIAGNOSIS — D62 Acute posthemorrhagic anemia: Secondary | ICD-10-CM | POA: Diagnosis not present

## 2020-07-08 DIAGNOSIS — I10 Essential (primary) hypertension: Secondary | ICD-10-CM | POA: Diagnosis not present

## 2020-07-08 DIAGNOSIS — K6289 Other specified diseases of anus and rectum: Secondary | ICD-10-CM

## 2020-07-08 DIAGNOSIS — K922 Gastrointestinal hemorrhage, unspecified: Secondary | ICD-10-CM | POA: Diagnosis not present

## 2020-07-08 DIAGNOSIS — C20 Malignant neoplasm of rectum: Secondary | ICD-10-CM

## 2020-07-08 HISTORY — PX: COLONOSCOPY WITH PROPOFOL: SHX5780

## 2020-07-08 HISTORY — PX: BIOPSY: SHX5522

## 2020-07-08 LAB — COMPREHENSIVE METABOLIC PANEL
ALT: 16 U/L (ref 0–44)
AST: 21 U/L (ref 15–41)
Albumin: 3.1 g/dL — ABNORMAL LOW (ref 3.5–5.0)
Alkaline Phosphatase: 56 U/L (ref 38–126)
Anion gap: 7 (ref 5–15)
BUN: 5 mg/dL — ABNORMAL LOW (ref 8–23)
CO2: 26 mmol/L (ref 22–32)
Calcium: 8.2 mg/dL — ABNORMAL LOW (ref 8.9–10.3)
Chloride: 109 mmol/L (ref 98–111)
Creatinine, Ser: 0.86 mg/dL (ref 0.61–1.24)
GFR, Estimated: >60 mL/min (ref >60)
Glucose, Bld: 132 mg/dL — ABNORMAL HIGH (ref 70–99)
Potassium: 3.1 mmol/L — ABNORMAL LOW (ref 3.5–5.1)
Sodium: 142 mmol/L (ref 135–145)
Total Bilirubin: 1.8 mg/dL — ABNORMAL HIGH (ref 0.3–1.2)
Total Protein: 5.7 g/dL — ABNORMAL LOW (ref 6.5–8.1)

## 2020-07-08 LAB — BPAM RBC
Blood Product Expiration Date: 202205302359
Blood Product Expiration Date: 202206032359
ISSUE DATE / TIME: 202205161529
ISSUE DATE / TIME: 202205161901
Unit Type and Rh: 6200
Unit Type and Rh: 6200

## 2020-07-08 LAB — CBC
HCT: 25 % — ABNORMAL LOW (ref 39.0–52.0)
Hemoglobin: 8.1 g/dL — ABNORMAL LOW (ref 13.0–17.0)
MCH: 27.4 pg (ref 26.0–34.0)
MCHC: 32.4 g/dL (ref 30.0–36.0)
MCV: 84.5 fL (ref 80.0–100.0)
Platelets: 202 10*3/uL (ref 150–400)
RBC: 2.96 MIL/uL — ABNORMAL LOW (ref 4.22–5.81)
RDW: 18.1 % — ABNORMAL HIGH (ref 11.5–15.5)
WBC: 9 10*3/uL (ref 4.0–10.5)
nRBC: 0 % (ref 0.0–0.2)

## 2020-07-08 LAB — HEMOGLOBIN AND HEMATOCRIT, BLOOD
HCT: 25.7 % — ABNORMAL LOW (ref 39.0–52.0)
HCT: 26.2 % — ABNORMAL LOW (ref 39.0–52.0)
HCT: 27.5 % — ABNORMAL LOW (ref 39.0–52.0)
HCT: 28.8 % — ABNORMAL LOW (ref 39.0–52.0)
Hemoglobin: 8 g/dL — ABNORMAL LOW (ref 13.0–17.0)
Hemoglobin: 8.3 g/dL — ABNORMAL LOW (ref 13.0–17.0)
Hemoglobin: 8.5 g/dL — ABNORMAL LOW (ref 13.0–17.0)
Hemoglobin: 8.9 g/dL — ABNORMAL LOW (ref 13.0–17.0)

## 2020-07-08 LAB — TYPE AND SCREEN
ABO/RH(D): A POS
Antibody Screen: NEGATIVE
Unit division: 0
Unit division: 0

## 2020-07-08 LAB — GLUCOSE, CAPILLARY
Glucose-Capillary: 106 mg/dL — ABNORMAL HIGH (ref 70–99)
Glucose-Capillary: 154 mg/dL — ABNORMAL HIGH (ref 70–99)
Glucose-Capillary: 111 mg/dL — ABNORMAL HIGH (ref 70–99)

## 2020-07-08 SURGERY — COLONOSCOPY WITH PROPOFOL
Anesthesia: Monitor Anesthesia Care

## 2020-07-08 MED ORDER — ENSURE ENLIVE PO LIQD
237.0000 mL | Freq: Two times a day (BID) | ORAL | Status: DC
  Administered 2020-07-08: 237 mL via ORAL

## 2020-07-08 MED ORDER — INSULIN ASPART 100 UNIT/ML IJ SOLN
0.0000 [IU] | Freq: Three times a day (TID) | INTRAMUSCULAR | Status: DC
  Administered 2020-07-08: 2 [IU] via SUBCUTANEOUS
  Administered 2020-07-09: 1 [IU] via SUBCUTANEOUS

## 2020-07-08 MED ORDER — PROPOFOL 10 MG/ML IV BOLUS
INTRAVENOUS | Status: DC | PRN
  Administered 2020-07-08 (×2): 20 mg via INTRAVENOUS

## 2020-07-08 MED ORDER — ADULT MULTIVITAMIN W/MINERALS CH
1.0000 | ORAL_TABLET | Freq: Every day | ORAL | Status: DC
  Administered 2020-07-08 – 2020-07-09 (×2): 1 via ORAL
  Filled 2020-07-08 (×2): qty 1

## 2020-07-08 MED ORDER — LACTATED RINGERS IV SOLN
INTRAVENOUS | Status: AC | PRN
  Administered 2020-07-08: 1000 mL via INTRAVENOUS

## 2020-07-08 MED ORDER — LATANOPROST 0.005 % OP SOLN
1.0000 [drp] | Freq: Every day | OPHTHALMIC | Status: DC
  Administered 2020-07-08: 1 [drp] via OPHTHALMIC
  Filled 2020-07-08: qty 2.5

## 2020-07-08 MED ORDER — POTASSIUM CHLORIDE CRYS ER 20 MEQ PO TBCR
40.0000 meq | EXTENDED_RELEASE_TABLET | Freq: Once | ORAL | Status: AC
  Administered 2020-07-08: 40 meq via ORAL
  Filled 2020-07-08: qty 2

## 2020-07-08 MED ORDER — TAMSULOSIN HCL 0.4 MG PO CAPS
0.4000 mg | ORAL_CAPSULE | Freq: Every day | ORAL | Status: DC
  Administered 2020-07-08 – 2020-07-09 (×2): 0.4 mg via ORAL
  Filled 2020-07-08 (×2): qty 1

## 2020-07-08 MED ORDER — LIDOCAINE 2% (20 MG/ML) 5 ML SYRINGE
INTRAMUSCULAR | Status: DC | PRN
  Administered 2020-07-08: 40 mg via INTRAVENOUS

## 2020-07-08 MED ORDER — PROPOFOL 500 MG/50ML IV EMUL
INTRAVENOUS | Status: DC | PRN
  Administered 2020-07-08: 125 ug/kg/min via INTRAVENOUS

## 2020-07-08 SURGICAL SUPPLY — 22 items

## 2020-07-08 NOTE — Anesthesia Preprocedure Evaluation (Addendum)
Anesthesia Evaluation  Patient identified by MRN, date of birth, ID band Patient awake    Reviewed: Allergy & Precautions, NPO status , Patient's Chart, lab work & pertinent test results  Airway Mallampati: II  TM Distance: >3 FB Neck ROM: Full    Dental  (+) Upper Dentures, Edentulous Lower   Pulmonary former smoker,    Pulmonary exam normal breath sounds clear to auscultation       Cardiovascular hypertension, Pt. on medications Normal cardiovascular exam Rhythm:Regular Rate:Normal     Neuro/Psych Glaucoma- open angle negative psych ROS   GI/Hepatic Neg liver ROS, GI bleed   Endo/Other  diabetes, Well Controlled, Type 2Hyperlipidemia  Renal/GU negative Renal ROS   BPH    Musculoskeletal negative musculoskeletal ROS (+)   Abdominal   Peds  Hematology  (+) anemia ,   Anesthesia Other Findings   Reproductive/Obstetrics                           Anesthesia Physical Anesthesia Plan  ASA: III  Anesthesia Plan: MAC   Post-op Pain Management:    Induction: Intravenous  PONV Risk Score and Plan: 1 and Treatment may vary due to age or medical condition and Propofol infusion  Airway Management Planned: Natural Airway and Nasal Cannula  Additional Equipment:   Intra-op Plan:   Post-operative Plan:   Informed Consent: I have reviewed the patients History and Physical, chart, labs and discussed the procedure including the risks, benefits and alternatives for the proposed anesthesia with the patient or authorized representative who has indicated his/her understanding and acceptance.     Dental advisory given  Plan Discussed with: CRNA and Anesthesiologist  Anesthesia Plan Comments:        Anesthesia Quick Evaluation

## 2020-07-08 NOTE — Progress Notes (Signed)
PROGRESS NOTE        PATIENT DETAILS Name: Ryan Mosley Age: 80 y.o. Sex: male Date of Birth: 1940-03-24 Admit Date: 07/07/2020 Admitting Physician Para Skeans, MD [id number], Cammie Mcgee, MD  Brief Narrative: Patient is a 80 y.o. male with history of DM-2, HTN, HLD-who presented with hematochezia and acute blood loss anemia.  Significant events: 5/16>> admit for lower GI bleeding and acute blood loss anemia.  Required 2 units of PRBC transfusion  Significant studies: 5/16>> CT angio abdomen: No evidence of GI bleeding-no acute abdominal pelvic abnormality.  Antimicrobial therapy: None  Microbiology data: 5/16>> influenza/COVID-19 PCR: Negative  Procedures : None  Consults: GI  DVT Prophylaxis : SCDs Start: 07/07/20 1416   Subjective: No further hematochezia since admission.  No abdominal pain.   Assessment/Plan: Lower GI bleeding with acute blood loss anemia: Etiology likely diverticular bleeding-hemoglobin stable after 2 units of PRBC transfusion.  No further hematochezia.  For colonoscopy later today.  Will await further recommendations from GI.  Continue to follow CBC.  HTN: Stable-to hold lisinopril/amlodipine.  BPH: Continue Flomax  DM-2: Start SSI and monitor CBGs.  CBG (last 3)  No results for input(s): GLUCAP in the last 72 hours.  Glaucoma: Continue usual eyedrops.   Diet: Diet Order            Diet NPO time specified  Diet effective 0500                  Code Status: Full code  Family Communication: None at bedside  Disposition Plan: Status is: Inpatient  Remains inpatient appropriate because:Inpatient level of care appropriate due to severity of illness   Dispo: The patient is from: Home              Anticipated d/c is to: Home              Patient currently is not medically stable to d/c.   Difficult to place patient No    Barriers to Discharge: Lower GI bleeding with acute blood loss  anemia-await colonoscopy-need to ensure stability of hemoglobin before consideration of discharge.  Antimicrobial agents: Anti-infectives (From admission, onward)   None       Time spent: 35 minutes-Greater than 50% of this time was spent in counseling, explanation of diagnosis, planning of further management, and coordination of care.  MEDICATIONS: Scheduled Meds: . [MAR Hold] pantoprazole (PROTONIX) IV  40 mg Intravenous Q24H  . [MAR Hold] potassium chloride  40 mEq Oral Once   Continuous Infusions: . sodium chloride 50 mL/hr at 07/07/20 2354  . lactated ringers 20 mL/hr at 07/08/20 1058   PRN Meds:.[MAR Hold] hydrALAZINE, lactated ringers   PHYSICAL EXAM: Vital signs: Vitals:   07/07/20 2343 07/08/20 0417 07/08/20 0717 07/08/20 1025  BP: (!) 143/73 (!) 150/80 (!) 143/71 (!) 147/59  Pulse: 75 74 72 68  Resp: 16 18 18  (!) 24  Temp: 98.3 F (36.8 C) 98.2 F (36.8 C) 98.2 F (36.8 C) 99.1 F (37.3 C)  TempSrc: Oral Oral Oral Oral  SpO2: 100% 100% 100% 100%  Weight:      Height:       Filed Weights   07/07/20 1015 07/07/20 2240  Weight: 83.9 kg 74.5 kg   Body mass index is 22.28 kg/m.   Gen Exam:Alert awake-not in any distress HEENT:atraumatic, normocephalic Chest: B/L clear  to auscultation anteriorly CVS:S1S2 regular Abdomen:soft non tender, non distended Extremities:no edema Neurology: Non focal Skin: no rash  I have personally reviewed following labs and imaging studies  LABORATORY DATA: CBC: Recent Labs  Lab 07/07/20 1024 07/07/20 1242 07/07/20 2252 07/08/20 0050 07/08/20 0626  WBC 12.7*  --   --   --  9.0  HGB 7.9* 7.1* 8.9* 8.3* 8.1*  HCT 26.1* 23.0* 28.8* 26.2* 25.0*  MCV 91.9  --   --   --  84.5  PLT 238  --   --   --  086    Basic Metabolic Panel: Recent Labs  Lab 07/07/20 1024 07/08/20 0626  NA 137 142  K 3.1* 3.1*  CL 105 109  CO2 26 26  GLUCOSE 237* 132*  BUN 10 5*  CREATININE 1.09 0.86  CALCIUM 8.3* 8.2*     GFR: Estimated Creatinine Clearance: 73.4 mL/min (by C-G formula based on SCr of 0.86 mg/dL).  Liver Function Tests: Recent Labs  Lab 07/07/20 1024 07/08/20 0626  AST 20 21  ALT 17 16  ALKPHOS 56 56  BILITOT 0.4 1.8*  PROT 6.0* 5.7*  ALBUMIN 3.3* 3.1*   No results for input(s): LIPASE, AMYLASE in the last 168 hours. No results for input(s): AMMONIA in the last 168 hours.  Coagulation Profile: Recent Labs  Lab 07/07/20 1037  INR 1.1    Cardiac Enzymes: No results for input(s): CKTOTAL, CKMB, CKMBINDEX, TROPONINI in the last 168 hours.  BNP (last 3 results) No results for input(s): PROBNP in the last 8760 hours.  Lipid Profile: No results for input(s): CHOL, HDL, LDLCALC, TRIG, CHOLHDL, LDLDIRECT in the last 72 hours.  Thyroid Function Tests: No results for input(s): TSH, T4TOTAL, FREET4, T3FREE, THYROIDAB in the last 72 hours.  Anemia Panel: No results for input(s): VITAMINB12, FOLATE, FERRITIN, TIBC, IRON, RETICCTPCT in the last 72 hours.  Urine analysis:    Component Value Date/Time   COLORURINE YELLOW 09/17/2019 0850   APPEARANCEUR CLEAR 09/17/2019 0850   LABSPEC 1.020 09/17/2019 0850   PHURINE 7.0 09/17/2019 0850   GLUCOSEU NEGATIVE 09/17/2019 0850   HGBUR TRACE (A) 09/17/2019 0850   KETONESUR NEGATIVE 09/17/2019 0850   PROTEINUR 2+ (A) 09/17/2019 0850   NITRITE NEGATIVE 09/17/2019 0850   LEUKOCYTESUR NEGATIVE 09/17/2019 0850    Sepsis Labs: Lactic Acid, Venous No results found for: LATICACIDVEN  MICROBIOLOGY: Recent Results (from the past 240 hour(s))  Resp Panel by RT-PCR (Flu A&B, Covid) Nasopharyngeal Swab     Status: None   Collection Time: 07/07/20  1:10 PM   Specimen: Nasopharyngeal Swab; Nasopharyngeal(NP) swabs in vial transport medium  Result Value Ref Range Status   SARS Coronavirus 2 by RT PCR NEGATIVE NEGATIVE Final    Comment: (NOTE) SARS-CoV-2 target nucleic acids are NOT DETECTED.  The SARS-CoV-2 RNA is generally detectable  in upper respiratory specimens during the acute phase of infection. The lowest concentration of SARS-CoV-2 viral copies this assay can detect is 138 copies/mL. A negative result does not preclude SARS-Cov-2 infection and should not be used as the sole basis for treatment or other patient management decisions. A negative result may occur with  improper specimen collection/handling, submission of specimen other than nasopharyngeal swab, presence of viral mutation(s) within the areas targeted by this assay, and inadequate number of viral copies(<138 copies/mL). A negative result must be combined with clinical observations, patient history, and epidemiological information. The expected result is Negative.  Fact Sheet for Patients:  EntrepreneurPulse.com.au  Fact Sheet for  Healthcare Providers:  IncredibleEmployment.be  This test is no t yet approved or cleared by the [place] and  has been authorized for detection and/or diagnosis of SARS-CoV-2 by FDA under an Emergency Use Authorization (EUA). This EUA will remain  in effect (meaning this test can be used) for the duration of the COVID-19 declaration under Section 564(b)(1) of the Act, 21 U.S.C.section 360bbb-3(b)(1), unless the authorization is terminated  or revoked sooner.       Influenza A by PCR NEGATIVE NEGATIVE Final   Influenza B by PCR NEGATIVE NEGATIVE Final    Comment: (NOTE) The Xpert Xpress SARS-CoV-2/FLU/RSV plus assay is intended as an aid in the diagnosis of influenza from Nasopharyngeal swab specimens and should not be used as a sole basis for treatment. Nasal washings and aspirates are unacceptable for Xpert Xpress SARS-CoV-2/FLU/RSV testing.  Fact Sheet for Patients: EntrepreneurPulse.com.au  Fact Sheet for Healthcare Providers: IncredibleEmployment.be  This test is not yet approved or cleared by the [place] FDA and has  been authorized for detection and/or diagnosis of SARS-CoV-2 by FDA under an Emergency Use Authorization (EUA). This EUA will remain in effect (meaning this test can be used) for the duration of the COVID-19 declaration under Section 564(b)(1) of the Act, 21 U.S.C. section 360bbb-3(b)(1), unless the authorization is terminated or revoked.  Performed at [facility], [place] [address], [place], [place] [address]     RADIOLOGY STUDIES/RESULTS: CT Angio Abd/Pel w/ and/or w/o  Result Date: 07/07/2020 CLINICAL DATA:  80 year old male with rectal bleeding. EXAM: CTA ABDOMEN AND PELVIS WITH CONTRAST TECHNIQUE: Multidetector CT imaging of the abdomen and pelvis was performed using the standard protocol during bolus administration of intravenous contrast. Multiplanar reconstructed images and MIPs were obtained and reviewed to evaluate the vascular anatomy. CONTRAST:  187mL OMNIPAQUE IOHEXOL 350 MG/ML SOLN COMPARISON:  None. FINDINGS: VASCULAR Aorta: Normal caliber aorta without aneurysm, dissection, vasculitis or significant stenosis. Scattered atherosclerotic calcifications and fibrofatty plaque most prominent near the bifurcation. Celiac: Mild ostial stenosis secondary to atherosclerotic plaque. Patent distally. SMA: Patent without evidence of aneurysm, dissection, vasculitis or significant stenosis. Renals: Single renal arteries are patent without evidence of aneurysm, dissection, vasculitis, fibromuscular dysplasia or significant stenosis. IMA: Patent without evidence of aneurysm, dissection, vasculitis or significant stenosis. Inflow: Patent without evidence of aneurysm, dissection, vasculitis or significant stenosis. Proximal Outflow: Bilateral common femoral and visualized portions of the superficial and profunda femoral arteries are patent without evidence of aneurysm, dissection, vasculitis or significant stenosis. Veins: The hepatic veins are patent. The portal system is widely patent and normal  in caliber. The renal veins are patent with normal anatomic configuration. No evidence of iliocaval thrombosis or anatomic anomaly. Review of the MIP images confirms the above findings. NON-VASCULAR Lower chest: No acute abnormality. Hepatobiliary: The liver is normal in size, contour, and attenuation. There is a simple cyst in segment 4 measuring up to 3.7 cm. The gallbladder is present unremarkable. No intra or extrahepatic biliary ductal dilation. Pancreas: Unremarkable. No pancreatic ductal dilatation or surrounding inflammatory changes. Spleen: Normal in size without focal abnormality. Adrenals/Urinary Tract: Adrenal glands are unremarkable. Kidneys are normal, without renal calculi, focal lesion, or hydronephrosis. Bladder is decompressed. Stomach/Bowel: Stomach is within normal limits. Appendix is not definitively identified. No evidence of bowel wall thickening, distention, or inflammatory changes. There are a few scattered sigmoid colonic diverticula without surrounding inflammatory changes. No evidence of intraluminal hemorrhage. Lymphatic: No abdominopelvic lymphadenopathy. Reproductive: The prostate is enlarged measuring up to 6 cm  in greatest axial dimension Other: No abdominal wall hernia or abnormality. No abdominopelvic ascites. Musculoskeletal: No acute or significant osseous findings. IMPRESSION: VASCULAR No evidence of acute gastrointestinal hemorrhage, as queried. Aortic Atherosclerosis (ICD10-I70.0). NON-VASCULAR 1. No acute abdominopelvic abnormality. 2. Prostatomegaly. Ruthann Cancer, MD [facility] Electronically Signed   By: Ruthann Cancer MD   On: 07/07/2020 14:05     LOS: 1 day   Oren Binet, MD  [facility]    To contact the attending provider between 7A-7P or the covering provider during after hours 7P-7A, please log into the web site [email or web address] and access using universal [place] password for that web  site. If you do not have the password, please call the hospital operator.  07/08/2020, 11:12 AM

## 2020-07-08 NOTE — Interval H&P Note (Signed)
History and Physical Interval Note:  07/08/2020 10:42 AM  Ryan Mosley  has presented today for surgery, with the diagnosis of GI bleed.  The various methods of treatment have been discussed with the patient and family. After consideration of risks, benefits and other options for treatment, the patient has consented to  Procedure(s): COLONOSCOPY WITH PROPOFOL (N/A) as a surgical intervention.  The patient's history has been reviewed, patient examined, no change in status, stable for surgery.  I have reviewed the patient's chart and labs.  Questions were answered to the patient's satisfaction.     [place]

## 2020-07-08 NOTE — Op Note (Signed)
[facility] Patient Name: Ryan Mosley Procedure Date : 07/08/2020 MRN: [id number] Attending MD: Thornton Park MD, MD Date of Birth: [birth year] CSN: [id number] Age: 80 Admit Type: Inpatient Procedure:                Colonoscopy Indications:              Rectal bleeding, recurrent over the last week Providers:                Thornton Park MD, MD, Dulcy Fanny, Lesia Sago, Technician, Nicholes Calamity Referring MD:              Medicines:                Monitored Anesthesia Care Complications:            No immediate complications. Estimated blood loss:                            Minimal. Estimated Blood Loss:     Estimated blood loss was minimal. Procedure:                Pre-Anesthesia Assessment:                           - Prior to the procedure, a History and Physical                            was performed, and patient medications and                            allergies were reviewed. The patient's tolerance of                            previous anesthesia was also reviewed. The risks                            and benefits of the procedure and the sedation                            options and risks were discussed with the patient.                            All questions were answered, and informed consent                            was obtained. Prior Anticoagulants: The patient has                            taken no previous anticoagulant or antiplatelet                            agents. ASA Grade Assessment: III - A patient with  severe systemic disease. After reviewing the risks                            and benefits, the patient was deemed in                            satisfactory condition to undergo the procedure.                           After obtaining informed consent, the colonoscope                            was passed under direct vision. Throughout the                             procedure, the patient's blood pressure, pulse, and                            oxygen saturations were monitored continuously. The                            [id number] [id number] Olympus colonoscope was                            introduced through the anus and advanced to the 4                            cm into the ileum. The colonoscopy was performed                            without difficulty. The patient tolerated the                            procedure well. The quality of the bowel                            preparation was good. The terminal ileum, ileocecal                            valve, appendiceal orifice, and rectum were                            photographed. Scope In: 11:12:03 AM Scope Out: 11:37:04 AM Scope Withdrawal Time: 0 hours 20 minutes 42 seconds  Total Procedure Duration: 0 hours 25 minutes 1 second  Findings:      The digital rectal exam revealed a rubbery-textured and soft rectal mass.      A few small-mouthed diverticula were found in the sigmoid colon.      A frond-like/villous non-obstructing large mass was found in the rectum,       at least 1 cm from the anal verge. The mass involves approximately       20-25% of the circumference. The mass measured at least 4 cm in length.       Oozing was present. Multiple biopsies were obtained with a  cold forceps       for histology. Estimated blood loss was minimal.      The exam was otherwise without abnormality on direct and retroflexion       views. No blood was seen in the colon or the distal terminal ileum. Impression:               - Rectal mass. Worrisome for malignancy.                           - Diverticulosis in the sigmoid colon without                            evidence for diverticulitis or recent bleeding. Recommendation:           - Return patient to hospital ward for ongoing care.                           - Resume previous diet.                           - Continue present medications.                            - Await pathology results. Results have been sent                            rush.                           - Oncology consultation. Normally, proceed with                            rectal MRI and CT chest/abd/pelvis. However, he had                            a CTA abd/pelvis yesterday. Obtain CEA. Ideally,                            could complete imaging studies prior to discharge.                           The results were reviewed with the patient in the                            endoscopy recovery room. I gave him a copy of his                            procedure note. I was unable to reach his wife or                            daughter at any of the phone numbers available in                            the chart or in his hospital room to review these  results. Procedure Code(s):        --- Professional ---                           [id number], Colonoscopy, flexible; with biopsy, single                            or multiple Diagnosis Code(s):        --- Professional ---                           K62.89, Other specified diseases of anus and rectum                           D49.0, Neoplasm of unspecified behavior of                            digestive system                           K62.5, Hemorrhage of anus and rectum                           K57.30, Diverticulosis of large intestine without                            perforation or abscess without bleeding CPT copyright 2019 [employer]. All rights reserved. The codes documented in this report are preliminary and upon coder review may  be revised to meet current compliance requirements. Thornton Park MD, MD 07/08/2020 11:54:03 AM This report has been signed electronically. Number of Addenda: 0

## 2020-07-08 NOTE — Progress Notes (Signed)
Initial Nutrition Assessment  DOCUMENTATION CODES:   Not applicable  INTERVENTION:   -Ensure Enlive po BID, each supplement provides 350 kcal and 20 grams of protein -MVI with minerals daily  NUTRITION DIAGNOSIS:   Inadequate oral intake related to decreased appetite as evidenced by per patient/family report.  GOAL:   Patient will meet greater than or equal to 90% of their needs  MONITOR:   PO intake,Supplement acceptance,Labs,Weight trends,Skin,I & O's  REASON FOR ASSESSMENT:   Malnutrition Screening Tool    ASSESSMENT:   Ryan Mosley is a 80 y.o. male seen in ed with complaints of bright red blood per rectum that started this morning.  Patient had 3 episodes of bloody stools this morning, was associated with dizziness.  Pt admitted with GIB.   5/17- s/p colonoscopy- reveals rectal mass; awating pathology  Reviewed I/O's: +199 ml x 24 hours   UOP: 1 L x 24 hours  Spoke with pt, daughter, and wife at bedside. Per pt wife, pt is very picky eater. Pt reports he will consume 2-4 meals per day. Meals will consist of foods such as beans, potatoes, and vegetables OR eggs, bacon, and toast.   Pt has not had anything to eat yet due to colonoscopy (wife had recently ordered lunch). RD assisted pt wife with menu and personally ordered dinner and breakfast meals.   Per pt, his UBW is around 175#. His wife reports she has noticed a 20# weight loss, however, is unsure when he started losing weight. Noted pt has experienced a 5.1% wt loss over the past 4 months, which is not significant for time frame.   Discussed importance of good meal and supplement intake to promote healing. Pt amenable to supplements.   Lab Results  Component Value Date   HGBA1C 6.9 (H) 03/17/2020   PTA DM medications are none.   Labs reviewed: K: 3.1, CBGS: (inpatient orders for glycemic control are 0-9 units insulin aspart TID with meals).   NUTRITION - FOCUSED PHYSICAL EXAM:  Flowsheet Row Most  Recent Value  Orbital Region No depletion  Upper Arm Region No depletion  Thoracic and Lumbar Region No depletion  Buccal Region No depletion  Temple Region No depletion  Clavicle Bone Region Mild depletion  Clavicle and Acromion Bone Region Mild depletion  Scapular Bone Region Mild depletion  Dorsal Hand No depletion  Patellar Region Mild depletion  Anterior Thigh Region Mild depletion  Posterior Calf Region Mild depletion  Edema (RD Assessment) None  Hair Reviewed  Eyes Reviewed  Mouth Reviewed  Skin Reviewed  Nails Reviewed     Diet Order:   Diet Order            Diet regular Room service appropriate? Yes; Fluid consistency: Thin  Diet effective now                 EDUCATION NEEDS:   Education needs have been addressed  Skin:  Skin Assessment: Reviewed RN Assessment  Last BM:  07/08/20  Height:   Ht Readings from Last 1 Encounters:  07/07/20 6' (1.829 m)    Weight:   Wt Readings from Last 1 Encounters:  07/07/20 74.5 kg    Ideal Body Weight:  80.9 kg  BMI:  Body mass index is 22.28 kg/m.  Estimated Nutritional Needs:   Kcal:  2050-2250  Protein:  100-115 grams  Fluid:  > 2 L    Loistine Chance, RD, LDN, [place] Registered Dietitian II Certified Diabetes Care and Education Specialist Please refer  to [facility] for RD and/or RD on-call/weekend/after hours pager

## 2020-07-08 NOTE — Transfer of Care (Signed)
Immediate Anesthesia Transfer of Care Note  Patient: Ryan Mosley  Procedure(s) Performed: COLONOSCOPY WITH PROPOFOL (N/A ) BIOPSY  Patient Location: PACU  Anesthesia Type:MAC  Level of Consciousness: drowsy  Airway & Oxygen Therapy: Patient Spontanous Breathing and Patient connected to face mask oxygen  Post-op Assessment: Report given to RN and Post -op Vital signs reviewed and stable  Post vital signs: Reviewed and stable  Last Vitals:  Vitals Value Taken Time  BP    Temp    Pulse    Resp    SpO2      Last Pain:  Vitals:   07/08/20 1025  TempSrc: Oral  PainSc: 0-No pain         Complications: No complications documented.

## 2020-07-08 NOTE — Anesthesia Postprocedure Evaluation (Signed)
Anesthesia Post Note  Patient: Ryan Mosley  Procedure(s) Performed: COLONOSCOPY WITH PROPOFOL (N/A ) BIOPSY     Patient location during evaluation: PACU Anesthesia Type: MAC Level of consciousness: awake and alert and oriented Pain management: pain level controlled Vital Signs Assessment: post-procedure vital signs reviewed and stable Respiratory status: spontaneous breathing, nonlabored ventilation and respiratory function stable Cardiovascular status: stable and blood pressure returned to baseline Postop Assessment: no apparent nausea or vomiting Anesthetic complications: no   No complications documented.  Last Vitals:  Vitals:   07/08/20 1141 07/08/20 1156  BP: (!) 106/51 (!) 116/58  Pulse: 73 74  Resp: 16 19  Temp: 36.7 C   SpO2: 100% 94%    Last Pain:  Vitals:   07/08/20 1156  TempSrc:   PainSc: 0-No pain                 Talon Witting A.

## 2020-07-08 NOTE — Progress Notes (Signed)
Colonoscopy reveals rectal mass. Please see procedure note for complete details. Awaiting pathology results. Will proceed with staging. Findings and recommendations discussed with the patient in the endoscopy recovery area and with his wife by phone.

## 2020-07-09 ENCOUNTER — Telehealth: Payer: Self-pay | Admitting: Hematology

## 2020-07-09 DIAGNOSIS — I10 Essential (primary) hypertension: Secondary | ICD-10-CM | POA: Diagnosis not present

## 2020-07-09 DIAGNOSIS — D62 Acute posthemorrhagic anemia: Secondary | ICD-10-CM | POA: Diagnosis not present

## 2020-07-09 DIAGNOSIS — N401 Enlarged prostate with lower urinary tract symptoms: Secondary | ICD-10-CM | POA: Diagnosis not present

## 2020-07-09 DIAGNOSIS — K625 Hemorrhage of anus and rectum: Secondary | ICD-10-CM

## 2020-07-09 LAB — BASIC METABOLIC PANEL
Anion gap: 4 — ABNORMAL LOW (ref 5–15)
BUN: <5 mg/dL — ABNORMAL LOW (ref 8–23)
CO2: 26 mmol/L (ref 22–32)
Calcium: 8.2 mg/dL — ABNORMAL LOW (ref 8.9–10.3)
Chloride: 109 mmol/L (ref 98–111)
Creatinine, Ser: 0.93 mg/dL (ref 0.61–1.24)
GFR, Estimated: >60 mL/min (ref >60)
Glucose, Bld: 108 mg/dL — ABNORMAL HIGH (ref 70–99)
Potassium: 3.3 mmol/L — ABNORMAL LOW (ref 3.5–5.1)
Sodium: 139 mmol/L (ref 135–145)

## 2020-07-09 LAB — CBC
HCT: 24.5 % — ABNORMAL LOW (ref 39.0–52.0)
Hemoglobin: 7.7 g/dL — ABNORMAL LOW (ref 13.0–17.0)
MCH: 27.2 pg (ref 26.0–34.0)
MCHC: 31.4 g/dL (ref 30.0–36.0)
MCV: 86.6 fL (ref 80.0–100.0)
Platelets: 204 10*3/uL (ref 150–400)
RBC: 2.83 MIL/uL — ABNORMAL LOW (ref 4.22–5.81)
RDW: 17.8 % — ABNORMAL HIGH (ref 11.5–15.5)
WBC: 9.6 10*3/uL (ref 4.0–10.5)
nRBC: 0 % (ref 0.0–0.2)

## 2020-07-09 LAB — GLUCOSE, CAPILLARY
Glucose-Capillary: 125 mg/dL — ABNORMAL HIGH (ref 70–99)
Glucose-Capillary: 136 mg/dL — ABNORMAL HIGH (ref 70–99)

## 2020-07-09 LAB — SURGICAL PATHOLOGY

## 2020-07-09 LAB — MAGNESIUM: Magnesium: 2 mg/dL (ref 1.7–2.4)

## 2020-07-09 MED ORDER — POTASSIUM CHLORIDE CRYS ER 20 MEQ PO TBCR
40.0000 meq | EXTENDED_RELEASE_TABLET | Freq: Once | ORAL | Status: AC
  Administered 2020-07-09: 40 meq via ORAL
  Filled 2020-07-09: qty 2

## 2020-07-09 MED ORDER — ENSURE ENLIVE PO LIQD: 237.0000 mL | Freq: Two times a day (BID) | ORAL | 0 refills | Status: AC

## 2020-07-09 MED ORDER — SODIUM CHLORIDE 0.9 % IV SOLN
250.0000 mg | Freq: Once | INTRAVENOUS | Status: AC
  Administered 2020-07-09: 250 mg via INTRAVENOUS
  Filled 2020-07-09: qty 20

## 2020-07-09 MED ORDER — FERROUS SULFATE 325 (65 FE) MG PO TBEC: 325.0000 mg | DELAYED_RELEASE_TABLET | Freq: Two times a day (BID) | ORAL | 3 refills | Status: DC

## 2020-07-09 NOTE — Discharge Summary (Addendum)
PATIENT DETAILS Name: Ryan Mosley Age: 80 y.o. Sex: male Date of Birth: 11/15/1940 MRN: [id number]. Admitting Physician: Para Skeans, MD [id number], Cammie Mcgee, MD  Admit Date: 07/07/2020 Discharge date: 07/09/2020  Recommendations for Outpatient Follow-up:  1. Follow up with PCP in 1-2 weeks 2. Please obtain CMP/CBC in one week 3. Please ensure follow-up with oncology. 4. Colonoscopy biopsy pending-please follow. 5. Lung nodule-we will require repeat CT chest every 2 years until 5 years of stability established.  Admitted From:  Home  Disposition: [place]: No  Equipment/Devices: None  Discharge Condition: Stable  CODE STATUS: FULL CODE  Diet recommendation:  Diet Order            Diet - low sodium heart healthy           Diet Carb Modified           Diet regular Room service appropriate? Yes; Fluid consistency: Thin  Diet effective now                  Brief Narrative: Patient is a 80 y.o. male with history of DM-2, HTN, HLD-who presented with hematochezia and acute blood loss anemia.  Significant events: 5/16>> admit for lower GI bleeding and acute blood loss anemia.  Required 2 units of PRBC transfusion  Significant studies: 5/16>> CT angio abdomen: No evidence of GI bleeding-no acute abdominal pelvic abnormality. 5/17>> colonoscopy: Rectal mass seen 5/17>> CT chest: 1.4 cm x 1.1 cm subsolid nodule superior segment of left lower lobe, 3 mm left lower lobe nodule.  Antimicrobial therapy: None  Microbiology data: 5/16>> influenza/COVID-19 PCR: Negative  Procedures : 5/17>> colonoscopy: Rectal mass seen.  Biopsy pending.  Consults: GI  Brief Hospital Course: Lower GI bleeding with acute blood loss anemia:  No further rectal bleeding since admission-underwent colonoscopy which showed a rectal mass.  Hemoglobin fluctuating but relatively stable.  Did require 2 units of PRBC transfusion.  Patient denies any shortness of  breath-seems to be tolerating this level of hemoglobin quite well.  He is very anxious to go home-and has requested that he be discharged home today.  I have discussed with oncologist-Dr. Irene Limbo over the phone-patient will be called by the oncology scheduler for a outpatient appointment in the next week or so.  CT abdomen did not show any obvious malignancy-he has 2 lung nodules in his CT chest-unclear whether these are metastatic deposits.  Patient will need further work-up including PET scan to be done in the outpatient setting when he follows up with oncology.  We will discharge him home today at his own request.  We will place him on iron supplementation on discharge-he will also get a dose of IV iron prior to his actual discharge.  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  HTN: Stable-resume amlodipine-but continue to hold lisinopril for now  BPH: Continue Flomax  DM-2:  Diet controlled at home-continue outpatient monitoring.   Discharge Diagnoses:  Principal Problem:   GI bleed Active Problems:   Hypertension   Diabetes mellitus without complication ([place])   Primary open angle glaucoma (POAG) of both eyes   BPH (benign prostatic hyperplasia)   Acute blood loss anemia   Rectal mass   Discharge Instructions:  Activity:  As tolerated  Discharge Instructions    Ambulatory referral to Hematology / Oncology   Complete by: As directed    Newly diagnosed rectal cancer-biopsy done  on 5/17-pending at time of discharge-needs appointment in the next 1 week  Thank you.   Call MD for:   Complete by: As directed    Persistent rectal bleeding.   Diet - low sodium heart healthy   Complete by: As directed    Diet Carb Modified   Complete by: As directed    Discharge instructions   Complete by: As directed    Follow with Primary MD  Susy Frizzle, MD in 1-2  weeks  Please get a complete blood count and chemistry panel checked by your Primary MD at your next visit, and again as instructed by your Primary MD.  Get Medicines reviewed and adjusted: Please take all your medications with you for your next visit with your Primary MD  Laboratory/radiological data: Please request your Primary MD to go over all hospital tests and procedure/radiological results at the follow up, please ask your Primary MD to get all Hospital records sent to his/her office.  In some cases, they will be blood work, cultures and biopsy results pending at the time of your discharge. Please request that your primary care M.D. follows up on these results.  Also Note the following: If you experience worsening of your admission symptoms, develop shortness of breath, life threatening emergency, suicidal or homicidal thoughts you must seek medical attention immediately by calling 911 or calling your MD immediately  if symptoms less severe.  You must read complete instructions/literature along with all the possible adverse reactions/side effects for all the Medicines you take and that have been prescribed to you. Take any new Medicines after you have completely understood and accpet all the possible adverse reactions/side effects.   Do not drive when taking Pain medications or sleeping medications (Benzodaizepines)  Do not take more than prescribed Pain, Sleep and Anxiety Medications. It is not advisable to combine anxiety,sleep and pain medications without talking with your primary care practitioner  Special Instructions: If you have smoked or chewed Tobacco  in the last 2 yrs please stop smoking, stop any regular Alcohol  and or any Recreational drug use.  Wear Seat belts while driving.  Please note: You were cared for by a hospitalist during your hospital stay. Once you are discharged, your primary care physician will handle any further medical issues. Please note that NO REFILLS  for any discharge medications will be authorized once you are discharged, as it is imperative that you return to your primary care physician (or establish a relationship with a primary care physician if you do not have one) for your post hospital discharge needs so that they can reassess your need for medications and monitor your lab values.   1.)  Your colonoscopy biopsy results are pending at the time of discharge-please ask your primary care practitioner or your oncologist to follow on these results.  2.)  You will receive a call from oncology for a follow-up appointment  3.)  If you develop persistent/severe rectal bleeding-please seek immediate medical attention.   Increase activity slowly   Complete by: As directed      Allergies as of 07/09/2020   No Known Allergies     Medication List    STOP taking these medications   lisinopril 40 MG tablet Commonly known as: ZESTRIL     TAKE these medications   acetaminophen 500 MG tablet Commonly known as: TYLENOL Take 500 mg by mouth every morning.   ADVIL COLD/SINUS PO Take 1 tablet by mouth daily as needed (sinus relief).  amLODipine 5 MG tablet Commonly known as: NORVASC TAKE 1 TABLET BY MOUTH EVERY DAY FOR BLOOD PRESSURE What changed:   how much to take  how to take this  when to take this  additional instructions   feeding supplement Liqd Take 237 mLs by mouth 2 (two) times daily between meals.   ferrous sulfate 325 (65 FE) MG EC tablet Take 1 tablet (325 mg total) by mouth 2 (two) times daily.   latanoprost 0.005 % ophthalmic solution Commonly known as: XALATAN Place 1 drop into both eyes at bedtime.   rosuvastatin 5 MG tablet Commonly known as: CRESTOR TAKE 1 TABLET BY MOUTH ONCE A WEEK FOR CHOLESTEROL What changed:   how much to take  how to take this  when to take this  additional instructions   tamsulosin 0.4 MG Caps capsule Commonly known as: FLOMAX TAKE 1 [place]    Susy Frizzle, MD. Schedule an appointment as soon as possible for a visit in 1 week(s).   Specialty: Family Medicine Contact information: [address] [place] [address] [place] Follow up.   Why: You will get a call from the cancer center-if you do not hear from them in the next few days-please call [phone] and schedule an appointment.       Dr. Nadeen Landau. Go on 08/05/2020.   Why: Your appointment is 08/05/20 at 10:50am Please arrive 30 minutes prior to your appointment to check in and fill out paperwork. Bring photo ID and insurance information. Contact information: [facility] [place], [place]   Telephone #: [phone]             No Known Allergies  Other Procedures/Studies: CT CHEST WO CONTRAST  Result Date: 07/08/2020 CLINICAL DATA:  Colorectal cancer staging. Colonoscopy today revealed a lower rectal mass. EXAM: CT CHEST WITHOUT CONTRAST TECHNIQUE: Multidetector CT imaging of the chest was performed following the standard protocol without IV contrast. COMPARISON:  CT abdomen 07/07/2020 FINDINGS: Cardiovascular: Atherosclerotic calcification of the thoracic aorta and branch vessels. Low-density blood pool compatible with anemia. Mediastinum/Nodes: Right upper paratracheal node 0.8 cm in short axis on image 18 series 4. Lower paratracheal node anterior to the carina 0.9 cm in short axis on image 67 series 4. No overtly pathologic thoracic adenopathy identified. Lungs/Pleura: Biapical pleuroparenchymal scarring. Centrilobular emphysema. Linear scarring or subsegmental atelectasis in the right lower lobe. Mild volume loss along the left hemidiaphragm with subsegmental atelectasis or scarring in the lingula. 1.4 by 1.1 cm sub solid nodule in the superior segment left lower lobe on image 52 series 5, primarily  manifesting as interstitial accentuation. 3 mm left lower lobe nodule on image 120 series 5. Upper Abdomen: As on recent CT abdomen, there is a cyst in segment 4 of the liver. Musculoskeletal: Thoracic spondylosis. IMPRESSION: 1. 1.4 by 1.1 cm sub solid nodule in the superior segment left lower lobe. This is not typical for metastatic disease but could be inflammatory or conceivably low-grade adenocarcinoma. There is also a 3 mm left lower lobe nodule. Initial follow-up with CT at 6-12 months is recommended to confirm persistence. If persistent, repeat CT is recommended every 2 years until 5 years of stability has been established. This recommendation follows the consensus statement: Guidelines for Management of Incidental Pulmonary Nodules Detected on CT Images:  From the Fleischner Society 2017; Radiology 2017; [id number]. 2. Low-density blood pool compatible with anemia. 3.  Aortic Atherosclerosis (ICD10-I70.0). 4.  Emphysema (ICD10-J43.9). 5. Thoracic spondylosis. Electronically Signed   By: Van Clines M.D.   On: 07/08/2020 20:22   CT Angio Abd/Pel w/ and/or w/o  Result Date: 07/07/2020 CLINICAL DATA:  80 year old male with rectal bleeding. EXAM: CTA ABDOMEN AND PELVIS WITH CONTRAST TECHNIQUE: Multidetector CT imaging of the abdomen and pelvis was performed using the standard protocol during bolus administration of intravenous contrast. Multiplanar reconstructed images and MIPs were obtained and reviewed to evaluate the vascular anatomy. CONTRAST:  121mL OMNIPAQUE IOHEXOL 350 MG/ML SOLN COMPARISON:  None. FINDINGS: VASCULAR Aorta: Normal caliber aorta without aneurysm, dissection, vasculitis or significant stenosis. Scattered atherosclerotic calcifications and fibrofatty plaque most prominent near the bifurcation. Celiac: Mild ostial stenosis secondary to atherosclerotic plaque. Patent distally. SMA: Patent without evidence of aneurysm, dissection, vasculitis or significant stenosis. Renals: Single  renal arteries are patent without evidence of aneurysm, dissection, vasculitis, fibromuscular dysplasia or significant stenosis. IMA: Patent without evidence of aneurysm, dissection, vasculitis or significant stenosis. Inflow: Patent without evidence of aneurysm, dissection, vasculitis or significant stenosis. Proximal Outflow: Bilateral common femoral and visualized portions of the superficial and profunda femoral arteries are patent without evidence of aneurysm, dissection, vasculitis or significant stenosis. Veins: The hepatic veins are patent. The portal system is widely patent and normal in caliber. The renal veins are patent with normal anatomic configuration. No evidence of iliocaval thrombosis or anatomic anomaly. Review of the MIP images confirms the above findings. NON-VASCULAR Lower chest: No acute abnormality. Hepatobiliary: The liver is normal in size, contour, and attenuation. There is a simple cyst in segment 4 measuring up to 3.7 cm. The gallbladder is present unremarkable. No intra or extrahepatic biliary ductal dilation. Pancreas: Unremarkable. No pancreatic ductal dilatation or surrounding inflammatory changes. Spleen: Normal in size without focal abnormality. Adrenals/Urinary Tract: Adrenal glands are unremarkable. Kidneys are normal, without renal calculi, focal lesion, or hydronephrosis. Bladder is decompressed. Stomach/Bowel: Stomach is within normal limits. Appendix is not definitively identified. No evidence of bowel wall thickening, distention, or inflammatory changes. There are a few scattered sigmoid colonic diverticula without surrounding inflammatory changes. No evidence of intraluminal hemorrhage. Lymphatic: No abdominopelvic lymphadenopathy. Reproductive: The prostate is enlarged measuring up to 6 cm in greatest axial dimension Other: No abdominal wall hernia or abnormality. No abdominopelvic ascites. Musculoskeletal: No acute or significant osseous findings. IMPRESSION: VASCULAR No  evidence of acute gastrointestinal hemorrhage, as queried. Aortic Atherosclerosis (ICD10-I70.0). NON-VASCULAR 1. No acute abdominopelvic abnormality. 2. Prostatomegaly. Ruthann Cancer, MD [facility] Radiology Electronically Signed   By: Ruthann Cancer MD   On: 07/07/2020 14:05     TODAY-DAY OF DISCHARGE:  Subjective:   Kavaughn Karlberg today has no headache,no chest abdominal pain,no new weakness tingling or numbness, feels much better wants to go home today.  Objective:   Blood pressure 137/67, pulse 69, temperature 98.6 F (37 C), temperature source Oral, resp. rate (!) 22, height 6' (1.829 m), weight 74.5 kg, SpO2 97 %.  Intake/Output Summary (Last 24 hours) at 07/09/2020 1457 Last data filed at 07/09/2020 1136 Gross per 24 hour  Intake 417 ml  Output 700 ml  Net -283 ml   Filed Weights   07/07/20 1015 07/07/20 2240  Weight: 83.9 kg 74.5 kg    Exam: Awake Alert, Oriented *3, No new F.N deficits, Normal affect [place].AT,PERRAL Supple Neck,No JVD, No cervical lymphadenopathy appriciated.  Symmetrical Chest wall  movement, Good air movement bilaterally, CTAB RRR,No Gallops,Rubs or new Murmurs, No Parasternal Heave +ve B.Sounds, Abd Soft, Non tender, No organomegaly appriciated, No rebound -guarding or rigidity. No Cyanosis, Clubbing or edema, No new Rash or bruise   PERTINENT RADIOLOGIC STUDIES: CT CHEST WO CONTRAST  Result Date: 07/08/2020 CLINICAL DATA:  Colorectal cancer staging. Colonoscopy today revealed a lower rectal mass. EXAM: CT CHEST WITHOUT CONTRAST TECHNIQUE: Multidetector CT imaging of the chest was performed following the standard protocol without IV contrast. COMPARISON:  CT abdomen 07/07/2020 FINDINGS: Cardiovascular: Atherosclerotic calcification of the thoracic aorta and branch vessels. Low-density blood pool compatible with anemia. Mediastinum/Nodes: Right upper paratracheal node 0.8 cm in short axis on image 18 series  4. Lower paratracheal node anterior to the carina 0.9 cm in short axis on image 67 series 4. No overtly pathologic thoracic adenopathy identified. Lungs/Pleura: Biapical pleuroparenchymal scarring. Centrilobular emphysema. Linear scarring or subsegmental atelectasis in the right lower lobe. Mild volume loss along the left hemidiaphragm with subsegmental atelectasis or scarring in the lingula. 1.4 by 1.1 cm sub solid nodule in the superior segment left lower lobe on image 52 series 5, primarily manifesting as interstitial accentuation. 3 mm left lower lobe nodule on image 120 series 5. Upper Abdomen: As on recent CT abdomen, there is a cyst in segment 4 of the liver. Musculoskeletal: Thoracic spondylosis. IMPRESSION: 1. 1.4 by 1.1 cm sub solid nodule in the superior segment left lower lobe. This is not typical for metastatic disease but could be inflammatory or conceivably low-grade adenocarcinoma. There is also a 3 mm left lower lobe nodule. Initial follow-up with CT at 6-12 months is recommended to confirm persistence. If persistent, repeat CT is recommended every 2 years until 5 years of stability has been established. This recommendation follows the consensus statement: Guidelines for Management of Incidental Pulmonary Nodules Detected on CT Images: From the Fleischner Society 2017; Radiology 2017; 284:228-243. 2. Low-density blood pool compatible with anemia. 3.  Aortic Atherosclerosis (ICD10-I70.0). 4.  Emphysema (ICD10-J43.9). 5. Thoracic spondylosis. Electronically Signed   By: Van Clines M.D.   On: 07/08/2020 20:22     PERTINENT LAB RESULTS: CBC: Recent Labs    07/08/20 0626 07/08/20 1259 07/08/20 1906 07/09/20 0051  WBC 9.0  --   --  9.6  HGB 8.1*   < > 8.0* 7.7*  HCT 25.0*   < > 25.7* 24.5*  PLT 202  --   --  204   < > = values in this interval not displayed.   CMET CMP     Component Value Date/Time   NA 139 07/09/2020 0051   K 3.3 (L) 07/09/2020 0051   CL 109 07/09/2020  0051   CO2 26 07/09/2020 0051   GLUCOSE 108 (H) 07/09/2020 0051   BUN <5 (L) 07/09/2020 0051   CREATININE 0.93 07/09/2020 0051   CREATININE 0.83 03/17/2020 0842   CALCIUM 8.2 (L) 07/09/2020 0051   PROT 5.7 (L) 07/08/2020 0626   ALBUMIN 3.1 (L) 07/08/2020 0626   AST 21 07/08/2020 0626   ALT 16 07/08/2020 0626   ALKPHOS 56 07/08/2020 0626   BILITOT 1.8 (H) 07/08/2020 0626   GFRNONAA >60 07/09/2020 0051    GFR Estimated Creatinine Clearance: 67.9 mL/min (by C-G formula based on SCr of 0.93 mg/dL). No results for input(s): LIPASE, AMYLASE in the last 72 hours. No results for input(s): CKTOTAL, CKMB, CKMBINDEX, TROPONINI in the last 72 hours. Invalid input(s): POCBNP No results for input(s): DDIMER in the last 72 hours.  No results for input(s): HGBA1C in the last 72 hours. No results for input(s): CHOL, HDL, LDLCALC, TRIG, CHOLHDL, LDLDIRECT in the last 72 hours. No results for input(s): TSH, T4TOTAL, T3FREE, THYROIDAB in the last 72 hours.  Invalid input(s): FREET3 No results for input(s): VITAMINB12, FOLATE, FERRITIN, TIBC, IRON, RETICCTPCT in the last 72 hours. Coags: Recent Labs    07/07/20 1037  INR 1.1   Microbiology: Recent Results (from the past 240 hour(s))  Resp Panel by RT-PCR (Flu A&B, Covid) Nasopharyngeal Swab     Status: None   Collection Time: 07/07/20  1:10 PM   Specimen: Nasopharyngeal Swab; Nasopharyngeal(NP) swabs in vial transport medium  Result Value Ref Range Status   SARS Coronavirus 2 by RT PCR NEGATIVE NEGATIVE Final    Comment: (NOTE) SARS-CoV-2 target nucleic acids are NOT DETECTED.  The SARS-CoV-2 RNA is generally detectable in upper respiratory specimens during the acute phase of infection. The lowest concentration of SARS-CoV-2 viral copies this assay can detect is 138 copies/mL. A negative result does not preclude SARS-Cov-2 infection and should not be used as the sole basis for treatment or other patient management decisions. A negative  result may occur with  improper specimen collection/handling, submission of specimen other than nasopharyngeal swab, presence of viral mutation(s) within the areas targeted by this assay, and inadequate number of viral copies(<138 copies/mL). A negative result must be combined with clinical observations, patient history, and epidemiological information. The expected result is Negative.  Fact Sheet for Patients:  EntrepreneurPulse.com.au  Fact Sheet for Healthcare Providers:  IncredibleEmployment.be  This test is no t yet approved or cleared by the [place] FDA and  has been authorized for detection and/or diagnosis of SARS-CoV-2 by FDA under an Emergency Use Authorization (EUA). This EUA will remain  in effect (meaning this test can be used) for the duration of the COVID-19 declaration under Section 564(b)(1) of the Act, 21 U.S.C.section 360bbb-3(b)(1), unless the authorization is terminated  or revoked sooner.       Influenza A by PCR NEGATIVE NEGATIVE Final   Influenza B by PCR NEGATIVE NEGATIVE Final    Comment: (NOTE) The Xpert Xpress SARS-CoV-2/FLU/RSV plus assay is intended as an aid in the diagnosis of influenza from Nasopharyngeal swab specimens and should not be used as a sole basis for treatment. Nasal washings and aspirates are unacceptable for Xpert Xpress SARS-CoV-2/FLU/RSV testing.  Fact Sheet for Patients: EntrepreneurPulse.com.au  Fact Sheet for Healthcare Providers: IncredibleEmployment.be  This test is not yet approved or cleared by the [place] FDA and has been authorized for detection and/or diagnosis of SARS-CoV-2 by FDA under an Emergency Use Authorization (EUA). This EUA will remain in effect (meaning this test can be used) for the duration of the COVID-19 declaration under Section 564(b)(1) of the Act, 21 U.S.C. section 360bbb-3(b)(1), unless the authorization is  terminated or revoked.  Performed at [facility], [place] [address] [place], [place] [address]     FURTHER DISCHARGE INSTRUCTIONS:  Get Medicines reviewed and adjusted: Please take all your medications with you for your next visit with your Primary MD  Laboratory/radiological data: Please request your Primary MD to go over all hospital tests and procedure/radiological results at the follow up, please ask your Primary MD to get all Hospital records sent to his/her office.  In some cases, they will be blood work, cultures and biopsy results pending at the time of your discharge. Please request that your primary care M.D. goes through all the records of  your hospital data and follows up on these results.  Also Note the following: If you experience worsening of your admission symptoms, develop shortness of breath, life threatening emergency, suicidal or homicidal thoughts you must seek medical attention immediately by calling 911 or calling your MD immediately  if symptoms less severe.  You must read complete instructions/literature along with all the possible adverse reactions/side effects for all the Medicines you take and that have been prescribed to you. Take any new Medicines after you have completely understood and accpet all the possible adverse reactions/side effects.   Do not drive when taking Pain medications or sleeping medications (Benzodaizepines)  Do not take more than prescribed Pain, Sleep and Anxiety Medications. It is not advisable to combine anxiety,sleep and pain medications without talking with your primary care practitioner  Special Instructions: If you have smoked or chewed Tobacco  in the last 2 yrs please stop smoking, stop any regular Alcohol  and or any Recreational drug use.  Wear Seat belts while driving.  Please note: You were cared for by a hospitalist during your hospital stay. Once you are discharged, your primary care physician will handle any  further medical issues. Please note that NO REFILLS for any discharge medications will be authorized once you are discharged, as it is imperative that you return to your primary care physician (or establish a relationship with a primary care physician if you do not have one) for your post hospital discharge needs so that they can reassess your need for medications and monitor your lab values.  Total Time spent coordinating discharge including counseling, education and face to face time equals 35 minutes.  SignedOren Binet 07/09/2020 2:57 PM

## 2020-07-09 NOTE — Progress Notes (Signed)
Give one dose of Ferrlicit 250mg  IV x1.  Onnie Boer, PharmD, BCIDP, AAHIVP, CPP Infectious Disease Pharmacist 07/09/2020 11:06 AM

## 2020-07-09 NOTE — Progress Notes (Signed)
  Awaiting reports from biopsy of rectal mass, worrisome for malignancy at colonoscopy yesterday.   No evidence cancer or mets on CTAP w angio 2 LLL lung nodules on CT chest, all appearance not typical for mets but cannot rule out metastatic lesions. Presented with hematochezia, no prior colonoscopy. Anemia persists.  Hb 7.7.  Normal MCV. Received Ferril[facility] on 1/09 Will follow peripherally til pathology results.   Ordered CEA.   Oncology been contacted.   May also need gen surgery consult.    Azucena Freed PA-C.

## 2020-07-09 NOTE — Telephone Encounter (Signed)
Encounter created in error

## 2020-07-10 ENCOUNTER — Telehealth: Payer: Self-pay | Admitting: Family Medicine

## 2020-07-10 ENCOUNTER — Telehealth: Payer: Self-pay

## 2020-07-10 ENCOUNTER — Encounter (HOSPITAL_COMMUNITY): Payer: Self-pay | Admitting: Gastroenterology

## 2020-07-10 ENCOUNTER — Other Ambulatory Visit: Payer: Self-pay

## 2020-07-10 DIAGNOSIS — K6282 Dysplasia of anus: Secondary | ICD-10-CM

## 2020-07-10 LAB — CEA: CEA: 1.4 ng/mL (ref 0.0–4.7)

## 2020-07-10 NOTE — Telephone Encounter (Signed)
Scheduled patient for MRI pelvis w & w/o at [facility] on 07/13/20. Patient to arrive at 8:30am at admitting and tell them he is there for an MRI. Spoke to wife, per patient request.

## 2020-07-10 NOTE — Telephone Encounter (Signed)
-----   Message from Thornton Park, MD sent at 07/10/2020  1:17 PM EDT ----- Regarding: RE: Referral [name],  Please arrange for rectal MRI for Ryan Mosley. I am working on the schedule to find a time that he could have a flexible sigmoidoscopy for more biopsies.  Thanks.  KLB ----- Message ----- From: Jonnie Finner, RN Sent: 07/10/2020   1:06 PM EDT To: Thornton Park, MD Subject: Referral                                       Dr. Tarri Glenn,  We had received referral from hospitalist for your patient.  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  I spoke with Dr. Burr Medico and she says she is glad to see the patient but he will need another biopsy and MRI for staging.    Are you okay if we wait to see in Medical Oncology after these things are done?  We are happy to see him but unfortunately need pathology confirmation prior to treatment.  Thank you Roney Mans RN GI Nurse [place]

## 2020-07-10 NOTE — Telephone Encounter (Signed)
Transition Care Management Follow-up Telephone Call  Date of discharge and from where: 07/09/2020 from [facility]   How have you been since you were released from the hospital? Patient states he is doing good since being discharged   Any questions or concerns? No  Items Reviewed:  Did the pt receive and understand the discharge instructions provided? Yes   Medications obtained and verified? Yes   Other? No   Any new allergies since your discharge? No   Dietary orders reviewed? Yes  Do you have support at home? Yes   Home Care and Equipment/Supplies: Were home health services ordered? no If so, what is the name of the agency? N/A   Has the agency set up a time to come to the patient's home? no Were any new equipment or medical supplies ordered?  No What is the name of the medical supply agency? No Were you able to get the supplies/equipment? no Do you have any questions related to the use of the equipment or supplies? No  Functional Questionnaire: (I = Independent and D = Dependent) ADLs: I  Bathing/Dressing- I  Meal Prep- I  Eating- I  Maintaining continence- I  Transferring/Ambulation- I  Managing Meds- I  Follow up appointments reviewed:   PCP Hospital f/u appt confirmed? Yes  Scheduled to see Dr. Dennard Schaumann on 07/17/2020 @ 11:15 am .  [facility] f/u appt confirmed? No    Are transportation arrangements needed? No   If their condition worsens, is the pt aware to call PCP or go to the Emergency Dept.? Yes  Was the patient provided with contact information for the PCP's office or ED? Yes  Was to pt encouraged to call back with questions or concerns? Yes

## 2020-07-13 ENCOUNTER — Ambulatory Visit (HOSPITAL_COMMUNITY): Admission: RE | Admit: 2020-07-13 | Payer: Medicare Other | Source: Ambulatory Visit

## 2020-07-15 ENCOUNTER — Telehealth: Payer: Self-pay

## 2020-07-15 DIAGNOSIS — K6282 Dysplasia of anus: Secondary | ICD-10-CM

## 2020-07-15 NOTE — Telephone Encounter (Signed)
-----   Message from Thornton Park, MD sent at 07/15/2020  8:31 AM EDT ----- Please schedule flex sig with me with additional biopsies this week. I see several openings on Friday. I'd like to use one for him before we run out of time and fill them in clinic today :)  Thanks so much!!!  KLB ----- Message ----- From: Irving Copas., MD Sent: 07/10/2020   5:24 PM EDT To: Thornton Park, MD  KLB, Thanks for reaching out. Interesting case. Certainly EUS with repeat biopsies would be helpful .  But sure based on the schedule that DJ and I have for the next few weeks that we would be able to get him in.  If your pretest chance and gestalt is that this is malignant, and with your pictures suggesting that the lesion extends to the dentate line itself, is a malignancy then I think repeat flex sig biopsies if you have availability is probably the best way of trying to go ahead and get a diagnosis sooner.  MRI pelvis has supplanted rectal EUS in most instances anyways.  Having that performed would also give information.  Interestingly, even if oncology will not see the patient, having the patient be evaluated by colorectal surgery for TAMIS or TENS may be the way of having this polypoid lesion removed since it is having active bleeding.  Referral there will be helpful as well. Hopefully that makes sense. GM  ----- Message ----- From: Thornton Park, MD Sent: 07/10/2020   1:17 PM EDT To: Irving Copas., MD  Gabe, these guy as a large rectal polyp that looks like it must have malignancy. Unfortunately, biopsies don't show any. I'm going to need to arrange for additional biopsies before oncology will see him. Would you consider EUS for better diagnosis and potentially concurrent staging? Otherwise, I will set him up for a flex sig next week.  Thanks!  KLB

## 2020-07-15 NOTE — Telephone Encounter (Signed)
Pt scheduled for previsit tomorrow at 3:30pm, Flex sig scheduled for Friday 07/18/20 at 1:30pm in the [place]. Pts wife aware of appts.

## 2020-07-16 ENCOUNTER — Other Ambulatory Visit: Payer: Self-pay

## 2020-07-16 ENCOUNTER — Ambulatory Visit (AMBULATORY_SURGERY_CENTER): Payer: Medicare Other

## 2020-07-16 VITALS — Ht 69.5 in | Wt 158.4 lb

## 2020-07-16 DIAGNOSIS — Z8719 Personal history of other diseases of the digestive system: Secondary | ICD-10-CM

## 2020-07-16 DIAGNOSIS — Z86010 Personal history of colonic polyps: Secondary | ICD-10-CM

## 2020-07-16 NOTE — Progress Notes (Signed)
Pre visit completed via phone call; Patient verified name, DOB, and address; No egg or soy allergy known to patient  No issues with past sedation with any surgeries or procedures Patient denies ever being told they had issues or difficulty with intubation  No FH of Malignant Hyperthermia No diet pills per patient No home 02 use per patient  No blood thinners per patient  Pt denies issues with constipation  No A fib or A flutter  COVID 19 guidelines implemented in PV today with Pt and RN  NO PA's for preps discussed with pt in PV today  Discussed with pt there will be an out-of-pocket cost for prep and that varies from $0 to 70 dollars  Due to the COVID-19 pandemic we are asking patients to follow certain guidelines.  Pt aware of COVID protocols and [place] guidelines  Patient advised to avoid chew tobacco starting on 07/17/2020;   Patient has requested the instructions be placed on the 3rd for the patient's daughter to pick them up;

## 2020-07-17 ENCOUNTER — Ambulatory Visit (INDEPENDENT_AMBULATORY_CARE_PROVIDER_SITE_OTHER): Payer: Medicare Other | Admitting: Family Medicine

## 2020-07-17 ENCOUNTER — Encounter: Payer: Self-pay | Admitting: Family Medicine

## 2020-07-17 ENCOUNTER — Encounter: Payer: Self-pay | Admitting: Gastroenterology

## 2020-07-17 ENCOUNTER — Other Ambulatory Visit: Payer: Self-pay

## 2020-07-17 ENCOUNTER — Ambulatory Visit: Payer: Medicare Other | Admitting: Hematology

## 2020-07-17 VITALS — BP 142/68 | HR 94 | Temp 98.5°F | Resp 16 | Ht 69.0 in | Wt 162.0 lb

## 2020-07-17 DIAGNOSIS — K922 Gastrointestinal hemorrhage, unspecified: Secondary | ICD-10-CM | POA: Diagnosis not present

## 2020-07-17 DIAGNOSIS — R911 Solitary pulmonary nodule: Secondary | ICD-10-CM | POA: Diagnosis not present

## 2020-07-17 DIAGNOSIS — K6289 Other specified diseases of anus and rectum: Secondary | ICD-10-CM

## 2020-07-17 LAB — CBC WITH DIFFERENTIAL/PLATELET
Absolute Monocytes: 643 cells/uL (ref 200–950)
Basophils Absolute: 47 cells/uL (ref 0–200)
Basophils Relative: 0.7 %
Eosinophils Absolute: 188 cells/uL (ref 15–500)
Eosinophils Relative: 2.8 %
HCT: 33.3 % — ABNORMAL LOW (ref 38.5–50.0)
Hemoglobin: 10.1 g/dL — ABNORMAL LOW (ref 13.2–17.1)
Lymphs Abs: 1668 cells/uL (ref 850–3900)
MCH: 26.4 pg — ABNORMAL LOW (ref 27.0–33.0)
MCHC: 30.3 g/dL — ABNORMAL LOW (ref 32.0–36.0)
MCV: 87.2 fL (ref 80.0–100.0)
MPV: 9 fL (ref 7.5–12.5)
Monocytes Relative: 9.6 %
Neutro Abs: 4154 cells/uL (ref 1500–7800)
Neutrophils Relative %: 62 %
Platelets: 426 10*3/uL — ABNORMAL HIGH (ref 140–400)
RBC: 3.82 10*6/uL — ABNORMAL LOW (ref 4.20–5.80)
RDW: 17 % — ABNORMAL HIGH (ref 11.0–15.0)
Total Lymphocyte: 24.9 %
WBC: 6.7 10*3/uL (ref 3.8–10.8)

## 2020-07-17 LAB — COMPLETE METABOLIC PANEL WITH GFR
AG Ratio: 1.3 (calc) (ref 1.0–2.5)
ALT: 16 U/L (ref 9–46)
AST: 17 U/L (ref 10–35)
Albumin: 4.3 g/dL (ref 3.6–5.1)
Alkaline phosphatase (APISO): 68 U/L (ref 35–144)
BUN: 9 mg/dL (ref 7–25)
CO2: 25 mmol/L (ref 20–32)
Calcium: 9.4 mg/dL (ref 8.6–10.3)
Chloride: 105 mmol/L (ref 98–110)
Creat: 0.92 mg/dL (ref 0.70–1.18)
GFR, Est African American: 91 mL/min/{1.73_m2} (ref >=60)
GFR, Est Non African American: 79 mL/min/{1.73_m2} (ref >=60)
Globulin: 3.2 g/dL (calc) (ref 1.9–3.7)
Glucose, Bld: 117 mg/dL — ABNORMAL HIGH (ref 65–99)
Potassium: 4.3 mmol/L (ref 3.5–5.3)
Sodium: 140 mmol/L (ref 135–146)
Total Bilirubin: 0.3 mg/dL (ref 0.2–1.2)
Total Protein: 7.5 g/dL (ref 6.1–8.1)

## 2020-07-17 NOTE — Progress Notes (Signed)
Subjective:    Patient ID: Ryan Mosley, male    DOB: January 11, 1941, 80 y.o.   MRN: [id number]  HPI  Patient was recently admitted to the hospital with a GI bleed: Admit Date: 07/07/2020 Discharge date: 07/09/2020  Recommendations for Outpatient Follow-up:  1. Follow up with PCP in 1-2 weeks 2. Please obtain CMP/CBC in one week 3. Please ensure follow-up with oncology. 4. Colonoscopy biopsy pending-please follow. 5. Lung nodule-we will require repeat CT chest every 2 years until 5 years of stability established.  Admitted From:  Home  Disposition: [place]: No  Equipment/Devices: None  Discharge Condition: Stable  CODE STATUS: FULL CODE  Diet recommendation:     Diet Order                  Diet - low sodium heart healthy            Diet Carb Modified            Diet regular Room service appropriate? Yes; Fluid consistency: Thin  Diet effective now                   Brief Narrative: Patient is a41 y.o.malewith history of DM-2, HTN, HLD-who presented with hematochezia and acute blood loss anemia.  Significant events: 5/16>>admit for lower GI bleeding and acute blood loss anemia. Required 2 units of PRBC transfusion  Significant studies: 5/16>>CT angio abdomen: No evidence of GI bleeding-no acute abdominal pelvic abnormality. 5/17>> colonoscopy: Rectal mass seen 5/17>> CT chest: 1.4 cm x 1.1 cm subsolid nodule superior segment of left lower lobe, 3 mm left lower lobe nodule.  Antimicrobial therapy: None  Microbiology data: 5/16>>influenza/COVID-19 PCR: Negative  Procedures : 5/17>> colonoscopy: Rectal mass seen.  Biopsy pending.  Consults: GI  Brief Hospital Course: Lower GI bleeding with acute blood loss anemia: No further rectal bleeding since admission-underwent colonoscopy which showed a rectal mass.  Hemoglobin fluctuating but relatively stable.  Did require 2 units of PRBC transfusion.  Patient  denies any shortness of breath-seems to be tolerating this level of hemoglobin quite well.  He is very anxious to go home-and has requested that he be discharged home today.  I have discussed with oncologist-Dr. Irene Mosley over the phone-patient will be called by the oncology scheduler for a outpatient appointment in the next week or so.  CT abdomen did not show any obvious malignancy-he has 2 lung nodules in his CT chest-unclear whether these are metastatic deposits.  Patient will need further work-up including PET scan to be done in the outpatient setting when he follows up with oncology.  We will discharge him home today at his own request.  We will place him on iron supplementation on discharge-he will also get a dose of IV iron prior to his actual discharge.  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  HTN: Stable-resume amlodipine-but continue to hold lisinopril for now  RDE:[id number] Flomax  DM-2: Diet controlled at home-continue outpatient monitoring.  Patient is here today for follow-up.  He denies any additional GI bleeding.  However he seems somewhat confused.  He is not sure which doctors he is seeing or what is going on.  He has an appointment to see Dr. Tarri Mosley tomorrow for a flexible sigmoidoscopy.  He denies any follow-up appointment with an oncologist or a follow-up appointment with the pulmonologist to discuss the spot in his lung.  He does  have an appointment for June 14 to meet with general surgery to discuss partial colectomy.  He denies any chest pain shortness of breath or dyspnea on exertion. Past Medical History:  Diagnosis Date  . Acute blood loss anemia    on meds  . Blood transfusion without reported diagnosis 2022  . Cataract    bilateral sx  . Diabetes mellitus without complication ([place])    not on meds at this time  . GERD (gastroesophageal  reflux disease)    hx of  . GI bleed   . Hyperlipidemia    on meds  . Hypertension    on meds  . Meningitis 1960/1970  . Pneumonia 1960/1970   Past Surgical History:  Procedure Laterality Date  . BIOPSY  07/08/2020   Procedure: BIOPSY;  Surgeon: Thornton Park, MD;  Location: [place];  Service: Gastroenterology;;  . COLONOSCOPY WITH PROPOFOL N/A 07/08/2020   Procedure: COLONOSCOPY WITH PROPOFOL;  Surgeon: Thornton Park, MD;  Location: [place];  Service: Gastroenterology;  Laterality: N/A;  . DENTAL SURGERY     all teeth removed   Current Outpatient Medications on File Prior to Visit  Medication Sig Dispense Refill  . acetaminophen (TYLENOL) 500 MG tablet Take 500 mg by mouth every morning.    Marland Kitchen amLODipine (NORVASC) 5 MG tablet TAKE 1 TABLET BY MOUTH EVERY DAY FOR BLOOD PRESSURE (Patient taking differently: Take 5 mg by mouth daily.) 90 tablet 2  . feeding supplement (ENSURE ENLIVE / ENSURE PLUS) LIQD Take 237 mLs by mouth 2 (two) times daily between meals. 14220 mL 0  . ferrous sulfate 325 (65 FE) MG EC tablet Take 1 tablet (325 mg total) by mouth 2 (two) times daily. 60 tablet 3  . latanoprost (XALATAN) 0.005 % ophthalmic solution Place 1 drop into both eyes at bedtime.  3  . Pseudoephedrine-Ibuprofen (ADVIL COLD/SINUS PO) Take 1 tablet by mouth daily as needed (sinus relief).    . rosuvastatin (CRESTOR) 5 MG tablet TAKE 1 TABLET BY MOUTH ONCE A WEEK FOR CHOLESTEROL (Patient taking differently: Take 5 mg by mouth once a week. Mondays) 12 tablet 2  . tamsulosin (FLOMAX) 0.4 MG CAPS capsule TAKE 1 CAPSULE BY MOUTH EVERY DAY 90 capsule 0   No current facility-administered medications on file prior to visit.   No Known Allergies Social History   Socioeconomic History  . Marital status: Married    Spouse name: Not on file  . Number of children: Not on file  . Years of education: Not on file  . Highest education level: Not on file  Occupational History  . Not on  file  Tobacco Use  . Smoking status: Former Smoker    Quit date: 1989    Years since quitting: 33.4  . Smokeless tobacco: Current User    Types: Chew  Vaping Use  . Vaping Use: Never used  Substance and Sexual Activity  . Alcohol use: Yes    Alcohol/week: 1.0 standard drink    Types: 1 Standard drinks or equivalent per week  . Drug use: Never  . Sexual activity: Not on file  Other Topics Concern  . Not on file  Social History Narrative  . Not on file   Social Determinants of Health   Financial Resource Strain: Low Risk   . Difficulty of Paying Living Expenses: Not very hard  Food Insecurity: Not on file  Transportation Needs: Not on file  Physical Activity: Not on file  Stress: Not on file  Social Connections:  Not on file  Intimate Partner Violence: Not on file     Review of Systems  All other systems reviewed and are negative.      Objective:   Physical Exam Vitals reviewed.  Constitutional:      Appearance: Normal appearance. He is normal weight.  Cardiovascular:     Rate and Rhythm: Normal rate and regular rhythm.     Heart sounds: Normal heart sounds. No murmur heard. No gallop.   Pulmonary:     Effort: Pulmonary effort is normal. No respiratory distress.     Breath sounds: Normal breath sounds. No wheezing or rhonchi.  Abdominal:     General: Bowel sounds are normal. There is no distension.     Palpations: Abdomen is soft.     Tenderness: There is no abdominal tenderness. There is no guarding.  Musculoskeletal:     Right lower leg: No edema.     Left lower leg: No edema.  Neurological:     Mental Status: He is alert.           Assessment & Plan:  Gastrointestinal hemorrhage, unspecified gastrointestinal hemorrhage type - Plan: CBC with Differential/Platelet, COMPLETE METABOLIC PANEL WITH GFR  Pulmonary nodule 1 cm or greater in diameter  Rectal mass  Regarding the GI bleed, the patient has had no further bleeding.  I will check a CBC to  ensure stability or improvement in his hemoglobin.  Patient seems somewhat confused about the nodule seen on the CT scan of his chest.  I gave the patient 3 options.  We could repeat a CAT scan in 6 months to monitor for stability.  We could order a PET scan.  However we could consult pulmonology for a second opinion.  I recommended a pulmonology consult.  The patient is about to undergo surgery to remove the mass seen in his colon.  I think it is important that we determine if the area in the lung represents another neoplasm or potentially a metastasis.  I feel that this is unlikely.  However I will consult pulmonology for second opinion.  Third, the patient has an appointment to meet with a general surgeon to discuss partial colectomy.  I encouraged him to keep this appointment

## 2020-07-18 ENCOUNTER — Encounter: Payer: Self-pay | Admitting: Gastroenterology

## 2020-07-18 ENCOUNTER — Ambulatory Visit (AMBULATORY_SURGERY_CENTER): Payer: Medicare Other | Admitting: Gastroenterology

## 2020-07-18 VITALS — BP 156/85 | HR 71 | Temp 97.8°F | Resp 14 | Ht 69.0 in | Wt 158.4 lb

## 2020-07-18 DIAGNOSIS — K6289 Other specified diseases of anus and rectum: Secondary | ICD-10-CM | POA: Diagnosis not present

## 2020-07-18 DIAGNOSIS — K621 Rectal polyp: Secondary | ICD-10-CM

## 2020-07-18 DIAGNOSIS — D128 Benign neoplasm of rectum: Secondary | ICD-10-CM | POA: Diagnosis not present

## 2020-07-18 DIAGNOSIS — Z86010 Personal history of colonic polyps: Secondary | ICD-10-CM

## 2020-07-18 MED ORDER — SODIUM CHLORIDE 0.9 % IV SOLN: 500.0000 mL | Freq: Once | INTRAVENOUS | Status: DC

## 2020-07-18 NOTE — Progress Notes (Signed)
Called to room to assist during endoscopic procedure.  Patient ID and intended procedure confirmed with present staff. Received instructions for my participation in the procedure from the performing physician.  

## 2020-07-18 NOTE — Op Note (Signed)
[facility] Patient Name: Ryan Mosley Procedure Date: 07/18/2020 1:16 PM MRN: [id number] Endoscopist: Thornton Park MD, MD Age: 80 Referring MD:  Date of Birth: 1940-04-07 Gender: Male Account #: [id number] Procedure:                Flexible Sigmoidoscopy Indications:              Hematochezia with rectal mass worrisome for                            malignancy, returns today for additional diagnostic                            biopsies Medicines:                Monitored Anesthesia Care Procedure:                Pre-Anesthesia Assessment:                           - Prior to the procedure, a History and Physical                            was performed, and patient medications and                            allergies were reviewed. The patient's tolerance of                            previous anesthesia was also reviewed. The risks                            and benefits of the procedure and the sedation                            options and risks were discussed with the patient.                            All questions were answered, and informed consent                            was obtained. Prior Anticoagulants: The patient has                            taken no previous anticoagulant or antiplatelet                            agents. ASA Grade Assessment: II - A patient with                            mild systemic disease. After reviewing the risks                            and benefits, the patient was deemed in  satisfactory condition to undergo the procedure.                           After obtaining informed consent, the scope was                            passed under direct vision. The Olympus PCF-H190DL                            ([id number]) Colonoscope was introduced through the                            anus and advanced to the the sigmoid colon. The                            flexible sigmoidoscopy was accomplished  without                            difficulty. The patient tolerated the procedure                            well. The quality of the bowel preparation was good. Scope In: 1:21:25 PM Scope Out: 1:33:48 PM Total Procedure Duration: 0 hours 12 minutes 23 seconds  Findings:                 The perianal and digital rectal examinations were                            normal except the mass is palpable on exam.                           A large, frond-like, non-obstructing polypoid                            lesion was found in the distal rectum. It nearly                            abutts the dentate line. No active bleeding was                            present. The lesion appears to involve at least                            20-25% of the circumference. The mass measures at                            least 4cm at widest diameter. This was biopsied                            with a cold forceps for histology. Estimated blood                            loss was minimal.  A few small-mouthed diverticula were found in the                            sigmoid colon. Complications:            No immediate complications. Estimated blood loss:                            Minimal. Estimated Blood Loss:     Estimated blood loss was minimal. Impression:               - Polypoid lesion in the distal rectum. No                            ammenable to endoscopic resection. Biopsied.                           - Diverticulosis in the sigmoid colon. Recommendation:           - Discharge patient to home.                           - The patient will be observed post-procedure,                            until all discharge criteria are met.                           - Resume previous diet.                           - Continue present medications.                           - Await pathology results.                           - Consultation with Dr. Nadeen Landau planned                             07/2020 to consider TAMIS or TENS for resection                            regardless of pathology results.                           - Rectal MRI planned when cleared by oncology (he                            received IV iron during his recent hospitalization). Thornton Park MD, MD 07/18/2020 1:45:38 PM This report has been signed electronically.

## 2020-07-18 NOTE — Progress Notes (Signed)
Pt's states no medical or surgical changes since previsit or office visit. 

## 2020-07-18 NOTE — Progress Notes (Signed)
Report to PACU, RN, vss, BBS= Clear.  

## 2020-07-18 NOTE — Patient Instructions (Signed)
YOU HAD AN ENDOSCOPIC PROCEDURE TODAY AT THE [facility]:   Refer to the procedure report that was given to you for any specific questions about what was found during the examination.  If the procedure report does not answer your questions, please call your gastroenterologist to clarify.  If you requested that your care partner not be given the details of your procedure findings, then the procedure report has been included in a sealed envelope for you to review at your convenience later.  YOU SHOULD EXPECT: Some feelings of bloating in the abdomen. Passage of more gas than usual.  Walking can help get rid of the air that was put into your GI tract during the procedure and reduce the bloating. If you had a lower endoscopy (such as a colonoscopy or flexible sigmoidoscopy) you may notice spotting of blood in your stool or on the toilet paper. If you underwent a bowel prep for your procedure, you may not have a normal bowel movement for a few days.  Please Note:  You might notice some irritation and congestion in your nose or some drainage.  This is from the oxygen used during your procedure.  There is no need for concern and it should clear up in a day or so.  SYMPTOMS TO REPORT IMMEDIATELY:   Following lower endoscopy (colonoscopy or flexible sigmoidoscopy):  Excessive amounts of blood in the stool  Significant tenderness or worsening of abdominal pains  Swelling of the abdomen that is new, acute  Fever of 100F or higher  For urgent or emergent issues, a gastroenterologist can be reached at any hour by calling [phone]. Do not use MyChart messaging for urgent concerns.    DIET:  We do recommend a small meal at first, but then you may proceed to your regular diet.  Drink plenty of fluids but you should avoid alcoholic beverages for 24 hours.  ACTIVITY:  You should plan to take it easy for the rest of today and you should NOT DRIVE or use heavy machinery until tomorrow (because  of the sedation medicines used during the test).    FOLLOW UP: Our staff will call the number listed on your records 48-72 hours following your procedure to check on you and address any questions or concerns that you may have regarding the information given to you following your procedure. If we do not reach you, we will leave a message.  We will attempt to reach you two times.  During this call, we will ask if you have developed any symptoms of COVID 19. If you develop any symptoms (ie: fever, flu-like symptoms, shortness of breath, cough etc.) before then, please call [phone].  If you test positive for Covid 19 in the 2 weeks post procedure, please call and report this information to us.    If any biopsies were taken you will be contacted by phone or by letter within the next 1-3 weeks.  Please call us at [phone] if you have not heard about the biopsies in 3 weeks.    SIGNATURES/CONFIDENTIALITY: You and/or your care partner have signed paperwork which will be entered into your electronic medical record.  These signatures attest to the fact that that the information above on your After Visit Summary has been reviewed and is understood.  Full responsibility of the confidentiality of this discharge information lies with you and/or your care-partner. 

## 2020-07-22 ENCOUNTER — Telehealth: Payer: Self-pay

## 2020-07-22 NOTE — Telephone Encounter (Signed)
  Follow up Call-  Call back number 07/18/2020  Post procedure Call Back phone  # [phone]  Permission to leave phone message Yes  Some recent data might be hidden     Patient questions:  Do you have a fever, pain , or abdominal swelling? No. Pain Score  0 *  Have you tolerated food without any problems? Yes.    Have you been able to return to your normal activities? Yes.    Do you have any questions about your discharge instructions: Diet   No. Medications  No. Follow up visit  No.  Do you have questions or concerns about your Care? No.  Actions: * If pain score is 4 or above: No action needed, pain <4. 1. Have you developed a fever since your procedure? no  2.   Have you had an respiratory symptoms (SOB or cough) since your procedure? no  3.   Have you tested positive for COVID 19 since your procedure no  4.   Have you had any family members/close contacts diagnosed with the COVID 19 since your procedure?  no   If yes to any of these questions please route to Joylene John, RN and Joella Prince, RN

## 2020-07-23 ENCOUNTER — Telehealth: Payer: Self-pay

## 2020-07-23 ENCOUNTER — Other Ambulatory Visit: Payer: Self-pay

## 2020-07-23 ENCOUNTER — Telehealth: Payer: Self-pay | Admitting: Gastroenterology

## 2020-07-23 DIAGNOSIS — K6289 Other specified diseases of anus and rectum: Secondary | ICD-10-CM

## 2020-07-23 NOTE — Telephone Encounter (Signed)
Thank you I will cancel the case

## 2020-07-23 NOTE — Telephone Encounter (Signed)
I called the pt and he tells me that he wants to cancel the lower EUS and Flex. He says that he is not doing any more procedures.   I have not taken it off the schedule as of yet incase Dr Burr Medico wants to speak with the pt first.

## 2020-07-23 NOTE — Telephone Encounter (Signed)
Inbound call from pt's daughter Rachael. She stated that her father Ryan Mosley would like a call back. She didn't specific what the call was about when I asked. Please give him a call. Thank you

## 2020-07-23 NOTE — Telephone Encounter (Signed)
----- Message from Milus Banister, MD sent at 07/23/2020  7:15 AM EDT ----- Regarding: RE: Referral Johnye Kist, Please add him on to the end of my currently scheduled procedures next Thursday June 9.  Flex sig with endoscopic ultrasound, thanks   ----- Message ----- From: Irving Copas., MD Sent: 07/22/2020   5:41 PM EDT To: Jonnie Finner, RN, Milus Banister, MD, # Subject: RE: Referral                                   Most recent biopsies are still pending that I see.  If pelvic MRI cannot be performed we can consider EUS if that will help her decision tree.  Timing of EUS however is going to be quite tight for DJ and I as we are booked out for 6 weeks but we can try and see what happens.  Keep [place] up-to-date on the final pathology. Ashley Montminy, when his next available slot with DJ or myself for lower EUS? Thanks. GM ----- Message ----- From: Truitt Merle, MD Sent: 07/22/2020  11:32 AM EDT To: Jonnie Finner, RN, Milus Banister, MD, # Subject: RE: Referral                                   Thanks Gerald Stabs!  Richardson Landry, I am copying Linna Hoff and Chester Holstein here if you want them to do EUS depends on or regardless the second biopsy result.   We will get him in quickly if he needs to see [place].  Thanks   Krista Blue  ----- Message ----- From: Jonnie Finner, RN Sent: 07/22/2020  11:04 AM EDT To: Milus Banister, MD, Michael Boston, MD, # Subject: RE: Referral                                   It's 3 months to wait for MRI. Malachy Mood  ----- Message ----- From: Ileana Roup, MD Sent: 07/22/2020  10:29 AM EDT To: Jonnie Finner, RN, Milus Banister, MD, # Subject: RE: Referral                                   Fransisca Connors, Dr. Tarri Glenn was going to be out for an extended period of time outside the country; I saw Dr. Ardis Hughs was on the message as well.. I am also about to be out and have a week of 'acute care surgery' coming up - we got him in to see Dr. Johney Maine this morning. I am copying him on this  message as well. Any idea how long someone can't get an MRI for after IV iron? If prolonged, maybe EUS would be helpful if endoscopic appearance is worrisome for a malignancy? Often, these transanal minimally invasive excisions are at the least are a more advanced 'excisional biopsy.' And often all they need even if T1  Gerald Stabs ----- Message ----- From: Truitt Merle, MD Sent: 07/21/2020  11:54 PM EDT To: Jonnie Finner, RN, Milus Banister, MD, # Subject: RE: Referral  Dr. Tarri Glenn,  If his second biopsy (done on 5/27) shows adenocarcinoma, we will be happy to see him. Per your procedure report you have referred pt to Dr. Dema Severin for TAMIS or TENS in June, do you plan to do EUS if biopsy conforms malignancy? Pt can not have pelvic MRI due to his recent iv iron.   His CT scan showed 1.4cm sub solid nodule in LLL lung, it does not appear to be highly suspicious for mets to me, but it's also reasonable to get a PET. We can order if he sees [place].   If you or Dr. Dema Severin want, we can review his case in GI tumor board.   Cheryl, please f/u on his biopsy result.   Thanks  Krista Blue  ----- Message ----- From: Jonnie Finner, RN Sent: 07/09/2020   3:14 PM EDT To: Truitt Merle, MD Subject: Referral                                       The hospitalist referred this patient for rectal mass pending path.  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 Tarri Glenn put in her note he needs surgical referral.    Seth Bake scheduled him to see you next Thursday but no adenocarcinoma.  What do you recommend?  Malachy Mood

## 2020-07-23 NOTE — Progress Notes (Signed)
I spoke with the patient and his wife at length regarding original referral we received from Dr. Tarri Glenn.  I explained in detail the events leading up to this point and the importance of him seeing Dr. Johney Maine for a consult regarding his rectal mass.  I also explained to him that he might not need to be seen by Dr. Burr Medico in medical oncology.  The patient states he feels fine and is having regular bowel movements.  I tried to explained that this mass can grow and eventually cause a blockage or obstruction and that is a medical emergency.  He finally states he would be willing to go and hear what Dr. Johney Maine has to say but he does not want to schedule anything next week.  I have sent Jean Rosenthal with [facility] a message to try to schedule him with Dr. Johney Maine week after next and to reach out to his wife Corene with that appointment.

## 2020-07-23 NOTE — Telephone Encounter (Signed)
Flex EUS scheduled for 6/9 at 12:45 pm at [facility] with Dr Ardis Hughs.  I spoke with the Ryan Mosley and his wife about the procedure. We discussed the instructions at length.  He and his wife are very confused and would like a call from Dr Burr Medico to discuss.  I am mailing the information to the Ryan Mosley however, he did not seem to have a good understanding of why he needs to have this procedure.  I tried to explain it to him but I am afraid he doesn't understand fully.

## 2020-07-24 ENCOUNTER — Telehealth: Payer: Self-pay

## 2020-07-28 ENCOUNTER — Telehealth: Payer: Self-pay | Admitting: Nurse Practitioner

## 2020-07-28 ENCOUNTER — Telehealth: Payer: Self-pay

## 2020-07-28 NOTE — Telephone Encounter (Signed)
Just to clarify no procedure needed at this time; just office visit correct?

## 2020-07-28 NOTE — Telephone Encounter (Signed)
Ammi, I have not yet see the flex sigmoidoscopy biopsy results obtained 07/18/2020. Dr. Tarri Glenn asked that I check on the biopsy results while she was away on vacation. Results were not received last week. Pls check on the status of his Flex sig 07/18/2020 biopsy results. THX

## 2020-07-28 NOTE — Telephone Encounter (Signed)
----- Message from Irving Copas., MD sent at 07/28/2020  4:04 PM EDT ----- Regarding: RE: Referral SG, Thanks again for the insight and discussion below and with help trying to get him into clinic to meet you. Emiah Pellicano, let's make sure that we have a follow up appointment with Dr. Tarri Glenn or one of our APPs in 3-4 weeks.  his will be to ensure that the patient has already followed up with Colorectal surgery, since it is felt that EUS would not change the recommendation for the patient at this time.  DJ and I will be available in future if EUS comes back into discussion, but not necessary at this time. Thanks. GM ----- Message ----- From: Michael Boston, MD Sent: 07/28/2020   8:53 AM EDT To: Jonnie Finner, RN, Milus Banister, MD, # Subject: RE: Referral                                   Think I already sent this to y'all but I do not know if everyone knew that  the patient no-showed to the [place] office 5/31 to consider surgical options.  My instinct is to offer transanal resection through robotic TAMIS or TEM.  I am skeptical that an MRI will help.  They tend to over read these things and call uT1-uT2 cancers, hence them wanting to wait 3 months before going for more accurate read.  I do not know if an EUS would change what I recommend.  Usually in these older folks, especially the one that tends to hesitate to show up, tend to want to be minimal & go a transanal resection first and not consider surgery like a low anterior resection.  Suspect he will jump for a watchful waiting protocol which may not be inappropriate in a 80 year old.  Just saw this note written last week by Valda Favia while looking in Epic for anything new in the past week:  "I spoke with the patient and his wife at length regarding original referral we received from Dr. Tarri Glenn.  I explained in detail the events leading up to this point and the importance of him seeing Dr. Johney Maine for a consult regarding his rectal  mass.  I also explained to him that he might not need to be seen by Dr. Burr Medico in medical oncology.  The patient states he feels fine and is having regular bowel movements.  I tried to explained that this mass can grow and eventually cause a blockage or obstruction and that is a medical emergency.  He finally states he would be willing to go and hear what Dr. Johney Maine has to say but he does not want to schedule anything next week.  I have sent Jean Rosenthal with [facility] a message to try to schedule him with Dr. Johney Maine week after next and to reach out to his wife Corene with that appointment.  Electronically signed by Jonnie Finner, RN at 07/23/2020 2:11 PM"  Dr Samella Parr 5/26 office note says 6/14 with me but it is not in my schedule yet,  So I guess he IS going to come to see me?  Or someone?  [facility] office will try to reach out to the patient again    ----- Message ----- From: Milus Banister, MD Sent: 07/23/2020   7:16 AM EDT To: Jonnie Finner, RN, Michael Boston, MD, # Subject: RE: Referral  Karington Zarazua, Please add him on to the end of my currently scheduled procedures next Thursday June 9.  Flex sig with endoscopic ultrasound, thanks   ----- Message ----- From: Irving Copas., MD Sent: 07/22/2020   5:41 PM EDT To: Jonnie Finner, RN, Milus Banister, MD, # Subject: RE: Referral                                   Most recent biopsies are still pending that I see.  If pelvic MRI cannot be performed we can consider EUS if that will help her decision tree.  Timing of EUS however is going to be quite tight for DJ and I as we are booked out for 6 weeks but we can try and see what happens.  Keep [place] up-to-date on the final pathology. Rochell Puett, when his next available slot with DJ or myself for lower EUS? Thanks. GM ----- Message ----- From: Truitt Merle, MD Sent: 07/22/2020  11:32 AM EDT To: Jonnie Finner, RN, Milus Banister, MD, # Subject: RE: Referral                                    Thanks Gerald Stabs!  Richardson Landry, I am copying Linna Hoff and Chester Holstein here if you want them to do EUS depends on or regardless the second biopsy result.   We will get him in quickly if he needs to see [place].  Thanks   Krista Blue  ----- Message ----- From: Jonnie Finner, RN Sent: 07/22/2020  11:04 AM EDT To: Milus Banister, MD, Michael Boston, MD, # Subject: RE: Referral                                   It's 3 months to wait for MRI. Malachy Mood  ----- Message ----- From: Ileana Roup, MD Sent: 07/22/2020  10:29 AM EDT To: Jonnie Finner, RN, Milus Banister, MD, # Subject: RE: Referral                                   Fransisca Connors, Dr. Tarri Glenn was going to be out for an extended period of time outside the country; I saw Dr. Ardis Hughs was on the message as well.. I am also about to be out and have a week of 'acute care surgery' coming up - we got him in to see Dr. Johney Maine this morning. I am copying him on this message as well. Any idea how long someone can't get an MRI for after IV iron? If prolonged, maybe EUS would be helpful if endoscopic appearance is worrisome for a malignancy? Often, these transanal minimally invasive excisions are at the least are a more advanced 'excisional biopsy.' And often all they need even if T1  Gerald Stabs ----- Message ----- From: Truitt Merle, MD Sent: 07/21/2020  11:54 PM EDT To: Jonnie Finner, RN, Milus Banister, MD, # Subject: RE: Referral                                   Dr. Tarri Glenn,  If his second biopsy (done on 5/27) shows adenocarcinoma, we will be  happy to see him. Per your procedure report you have referred pt to Dr. Dema Severin for TAMIS or TENS in June, do you plan to do EUS if biopsy conforms malignancy? Pt can not have pelvic MRI due to his recent iv iron.   His CT scan showed 1.4cm sub solid nodule in LLL lung, it does not appear to be highly suspicious for mets to me, but it's also reasonable to get a PET. We can order if he sees [place].   If  you or Dr. Dema Severin want, we can review his case in GI tumor board.   Cheryl, please f/u on his biopsy result.   Thanks  Krista Blue  ----- Message ----- From: Jonnie Finner, RN Sent: 07/09/2020   3:14 PM EDT To: Truitt Merle, MD Subject: Referral                                       The hospitalist referred this patient for rectal mass pending path.  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 Tarri Glenn put in her note he needs surgical referral.    Seth Bake scheduled him to see you next Thursday but no adenocarcinoma.  What do you recommend?  Malachy Mood

## 2020-07-28 NOTE — Telephone Encounter (Signed)
No procedures needed. Just want to have an office visit on the books with Dr. Tarri Glenn so that final follow-up and arrangement of any surgery referral if the patient continues to not show up to surgery clinic is in place. Thanks. GM

## 2020-07-29 NOTE — Telephone Encounter (Signed)
Several multidisciplinary staff notes have gone back and forth about him.  It looks like he is declining a lot of care.  No plans for further endoscopic procedures on our end however he should keep an appointment with Dr. Tarri Glenn in the relatively near future just so he is not completely lost to follow-up

## 2020-07-29 NOTE — Telephone Encounter (Signed)
The pt has been scheduled for an appt with Dr Tarri Glenn on 09/10/20 at 8:50 am.  Letter mailed with appt info to the pt.

## 2020-07-30 NOTE — Telephone Encounter (Signed)
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 SEE NOTE

## 2020-07-31 ENCOUNTER — Ambulatory Visit (HOSPITAL_COMMUNITY): Admit: 2020-07-31 | Payer: Medicare Other | Admitting: Gastroenterology

## 2020-07-31 ENCOUNTER — Encounter (HOSPITAL_COMMUNITY): Payer: Self-pay

## 2020-07-31 SURGERY — SIGMOIDOSCOPY, FLEXIBLE
Anesthesia: Monitor Anesthesia Care

## 2020-08-01 NOTE — Telephone Encounter (Signed)
Noted  

## 2020-08-14 ENCOUNTER — Encounter (HOSPITAL_COMMUNITY): Payer: Self-pay | Admitting: Emergency Medicine

## 2020-08-14 ENCOUNTER — Other Ambulatory Visit: Payer: Self-pay

## 2020-08-14 ENCOUNTER — Observation Stay (HOSPITAL_COMMUNITY)
Admission: EM | Admit: 2020-08-14 | Discharge: 2020-08-16 | Disposition: A | Discharge status: Home / Self Care | Payer: Medicare Other | Attending: Family Medicine | Admitting: Family Medicine

## 2020-08-14 DIAGNOSIS — D509 Iron deficiency anemia, unspecified: Secondary | ICD-10-CM | POA: Insufficient documentation

## 2020-08-14 DIAGNOSIS — Z79899 Other long term (current) drug therapy: Secondary | ICD-10-CM | POA: Diagnosis not present

## 2020-08-14 DIAGNOSIS — Z87891 Personal history of nicotine dependence: Secondary | ICD-10-CM | POA: Diagnosis not present

## 2020-08-14 DIAGNOSIS — K625 Hemorrhage of anus and rectum: Secondary | ICD-10-CM | POA: Diagnosis present

## 2020-08-14 DIAGNOSIS — I959 Hypotension, unspecified: Secondary | ICD-10-CM | POA: Diagnosis not present

## 2020-08-14 DIAGNOSIS — I10 Essential (primary) hypertension: Secondary | ICD-10-CM | POA: Insufficient documentation

## 2020-08-14 DIAGNOSIS — K922 Gastrointestinal hemorrhage, unspecified: Secondary | ICD-10-CM | POA: Diagnosis not present

## 2020-08-14 DIAGNOSIS — Z20822 Contact with and (suspected) exposure to covid-19: Secondary | ICD-10-CM | POA: Diagnosis not present

## 2020-08-14 DIAGNOSIS — E1165 Type 2 diabetes mellitus with hyperglycemia: Secondary | ICD-10-CM | POA: Insufficient documentation

## 2020-08-14 DIAGNOSIS — J439 Emphysema, unspecified: Secondary | ICD-10-CM | POA: Diagnosis not present

## 2020-08-14 LAB — PROTIME-INR
INR: 1 (ref 0.8–1.2)
Prothrombin Time: 13.5 seconds (ref 11.4–15.2)

## 2020-08-14 LAB — CBC WITH DIFFERENTIAL/PLATELET
Abs Immature Granulocytes: 0.09 10*3/uL — ABNORMAL HIGH (ref 0.00–0.07)
Basophils Absolute: 0.1 10*3/uL (ref 0.0–0.1)
Basophils Relative: 0 %
Eosinophils Absolute: 0.1 10*3/uL (ref 0.0–0.5)
Eosinophils Relative: 1 %
HCT: 34.5 % — ABNORMAL LOW (ref 39.0–52.0)
Hemoglobin: 10.8 g/dL — ABNORMAL LOW (ref 13.0–17.0)
Immature Granulocytes: 1 %
Lymphocytes Relative: 21 %
Lymphs Abs: 3 10*3/uL (ref 0.7–4.0)
MCH: 28.7 pg (ref 26.0–34.0)
MCHC: 31.3 g/dL (ref 30.0–36.0)
MCV: 91.8 fL (ref 80.0–100.0)
Monocytes Absolute: 1 10*3/uL (ref 0.1–1.0)
Monocytes Relative: 7 %
Neutro Abs: 9.9 10*3/uL — ABNORMAL HIGH (ref 1.7–7.7)
Neutrophils Relative %: 70 %
Platelets: 234 10*3/uL (ref 150–400)
RBC: 3.76 MIL/uL — ABNORMAL LOW (ref 4.22–5.81)
RDW: 17 % — ABNORMAL HIGH (ref 11.5–15.5)
WBC: 14.1 10*3/uL — ABNORMAL HIGH (ref 4.0–10.5)
nRBC: 0 % (ref 0.0–0.2)

## 2020-08-14 MED ORDER — SODIUM CHLORIDE 0.9 % IV BOLUS
1000.0000 mL | Freq: Once | INTRAVENOUS | Status: AC
  Administered 2020-08-14: 1000 mL via INTRAVENOUS

## 2020-08-14 NOTE — ED Provider Notes (Signed)
Emergency Medicine Provider Triage Evaluation Note  Ryan Mosley , a 80 y.o. male  was evaluated in triage.  Pt complains of rectal bleeding. Unsure how many episodes today, only occurs with a bowel movement. He tells me this started today, family member with him states it started on the 18th. She also states he has been generally weak.   Review of Systems  Positive: Rectal bleeding, weakness Negative: Abdominal pain, syncope  Physical Exam  BP (!) 67/44 (BP Location: Left Arm)   Pulse 82   Resp 18   SpO2 100%  Gen:   Awake Resp:  Normal effort  MSK:   Moves extremities without difficulty  Other:  Pale conjunctiva, abdomen nontender.   Medical Decision Making  Medically screening exam initiated at 10:31 PM.  Appropriate orders placed.  Shade Kaley was informed that the remainder of the evaluation will be completed by another provider, this initial triage assessment does not replace that evaluation, and the importance of remaining in the ED until their evaluation is complete.  Patient is hypotensive, triage/charge nurse made aware patient needs next available room in acute care ED, will remain in triage until placed in the back.   Chart reviewed:   Flex sigmoidoscopy 07/18/20:  - Polypoid lesion in the distal rectum. No ammenable to endoscopic resection. Biopsied. - Diverticulosis in the sigmoid colon.  Colonoscopy 07/08/20: - Rectal mass. Worrisome for malignancy. - Diverticulosis in the sigmoid colon without evidence for diverticulitis or recent bleeding     Amaryllis Dyke, PA-C 08/14/20 [id number]    Lennice Sites, DO 08/15/20 [id number]

## 2020-08-14 NOTE — ED Triage Notes (Signed)
Pt reports bright red blood with bowel movements starting today. Pt pale, hypotensive in triage.

## 2020-08-14 NOTE — ED Provider Notes (Signed)
[facility] PROGRESSIVE CARE Provider Note  CSN: [id number] Arrival date & time: 08/14/20 2224  Chief Complaint(s) Rectal Bleeding  HPI Ryan Mosley is a 80 y.o. 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  Patient denies any chest pain or shortness of breath.  No nausea or vomiting.  No abdominal pain.  Patient is not anticoagulated.  Lightheadedness improves with the sitting and lying down.  Worse with upright position.   Rectal Bleeding  Past Medical History Past Medical History:  Diagnosis Date   Acute blood loss anemia    on meds   Blood transfusion without reported diagnosis 2022   Cataract    bilateral sx   Diabetes mellitus without complication ([place])    not on meds at this time   GERD (gastroesophageal reflux disease)    hx of   GI bleed    Hyperlipidemia    on meds   Hypertension    on meds   Meningitis 1960/1970   Pneumonia 1960/1970   Patient Active Problem List   Diagnosis Date Noted   Rectal mass    GI bleed    Acute blood loss anemia    BPH (benign prostatic hyperplasia) 09/17/2019   Primary open angle glaucoma (POAG) of both eyes 03/19/2019   Allergic rhinitis 09/11/2018   Hyperlipemia 05/29/2017   Hypertension 05/27/2017   Diabetes mellitus without complication ([place]) 44/31/5400   Home Medication(s) Prior to Admission medications   Medication Sig Start Date End Date Taking? Authorizing Provider  acetaminophen (TYLENOL) 500 MG tablet Take 500 mg by mouth every morning.   Yes [provider]  amLODipine (NORVASC) 5 MG tablet TAKE 1 TABLET BY MOUTH EVERY DAY FOR BLOOD PRESSURE Patient taking differently: Take 5 mg by mouth daily. 03/17/20  Yes [place], Modena Nunnery, MD  ferrous sulfate  325 (65 FE) MG EC tablet Take 1 tablet (325 mg total) by mouth 2 (two) times daily. 07/09/20 07/09/21 Yes Ghimire, Henreitta Leber, MD  latanoprost (XALATAN) 0.005 % ophthalmic solution Place 1 drop into both eyes at bedtime. 06/13/17  Yes [provider]  Pseudoephedrine-Ibuprofen (ADVIL COLD/SINUS PO) Take 1 tablet by mouth daily as needed (sinus relief).   Yes [provider]  rosuvastatin (CRESTOR) 5 MG tablet TAKE 1 TABLET BY MOUTH ONCE A WEEK FOR CHOLESTEROL Patient taking differently: Take 5 mg by mouth every Monday. 03/17/20  Yes [place], Modena Nunnery, MD  tamsulosin (FLOMAX) 0.4 MG CAPS capsule TAKE 1 CAPSULE BY MOUTH EVERY DAY Patient not taking: No sig reported 06/13/20   Alycia Rossetti, MD  Past Surgical History Past Surgical History:  Procedure Laterality Date   BIOPSY  07/08/2020   Procedure: BIOPSY;  Surgeon: Thornton Park, MD;  Location: [place];  Service: Gastroenterology;;   COLONOSCOPY WITH PROPOFOL N/A 07/08/2020   Procedure: COLONOSCOPY WITH PROPOFOL;  Surgeon: Thornton Park, MD;  Location: [place];  Service: Gastroenterology;  Laterality: N/A;   DENTAL SURGERY     all teeth removed   Family History Family History  Problem Relation Age of Onset   Seizures Daughter    Diabetes Son    Colon polyps Neg Hx    Colon cancer Neg Hx    Esophageal cancer Neg Hx    Rectal cancer Neg Hx    Stomach cancer Neg Hx     Social History Social History   Tobacco Use   Smoking status: Former    Pack years: 0.00   Smokeless tobacco: Current    Types: Chew  Vaping Use   Vaping Use: Never used  Substance Use Topics   Alcohol use: Yes    Alcohol/week: 1.0 standard drink    Types: 1 Standard drinks or equivalent per week   Drug use: Never   Allergies Patient has no known allergies.  Review of Systems Review of Systems   Gastrointestinal:  Positive for hematochezia.  All other systems are reviewed and are negative for acute change except as noted in the HPI  Physical Exam Vital Signs  I have reviewed the triage vital signs BP 130/77 (BP Location: Left Arm)   Pulse 74   Temp 98.6 F (37 C) (Oral)   Resp 13   Ht 5\' 9"  (1.753 m)   Wt 71.6 kg   SpO2 100%   BMI 23.31 kg/m   Physical Exam Vitals reviewed.  Constitutional:      General: He is not in acute distress.    Appearance: He is well-developed. He is not diaphoretic.  HENT:     Head: Normocephalic and atraumatic.     Nose: Nose normal.  Eyes:     General: No scleral icterus.       Right eye: No discharge.        Left eye: No discharge.     Conjunctiva/sclera: Conjunctivae normal.     Pupils: Pupils are equal, round, and reactive to light.  Cardiovascular:     Rate and Rhythm: Normal rate and regular rhythm.     Heart sounds: No murmur heard.   No friction rub. No gallop.  Pulmonary:     Effort: Pulmonary effort is normal. No respiratory distress.     Breath sounds: Normal breath sounds. No stridor. No rales.  Abdominal:     General: There is no distension.     Palpations: Abdomen is soft.     Tenderness: There is no abdominal tenderness.  Genitourinary:    Rectum: Guaiac result positive.  Musculoskeletal:        General: No tenderness.     Cervical back: Normal range of motion and neck supple.  Skin:    General: Skin is warm and dry.     Findings: No erythema or rash.  Neurological:     Mental Status: He is alert and oriented to person, place, and time.    ED Results and Treatments Labs (all labs ordered are listed, but only abnormal results are displayed) Labs Reviewed  MRSA NEXT GEN BY PCR, NASAL - Abnormal; Notable for the following components:      Result Value   MRSA by PCR  Next Gen DETECTED (*)    All other components within normal limits  COMPREHENSIVE METABOLIC PANEL - Abnormal; Notable for the following  components:   CO2 21 (*)    Glucose, Bld 236 (*)    Creatinine, Ser 1.58 (*)    Total Protein 6.4 (*)    GFR, Estimated 44 (*)    All other components within normal limits  CBC WITH DIFFERENTIAL/PLATELET - Abnormal; Notable for the following components:   WBC 14.1 (*)    RBC 3.76 (*)    Hemoglobin 10.8 (*)    HCT 34.5 (*)    RDW 17.0 (*)    Neutro Abs 9.9 (*)    Abs Immature Granulocytes 0.09 (*)    All other components within normal limits  URINALYSIS, ROUTINE W REFLEX MICROSCOPIC - Abnormal; Notable for the following components:   APPearance HAZY (*)    Glucose, UA 50 (*)    All other components within normal limits  HEMOGLOBIN AND HEMATOCRIT, BLOOD - Abnormal; Notable for the following components:   Hemoglobin 9.4 (*)    HCT 31.3 (*)    All other components within normal limits  GLUCOSE, CAPILLARY - Abnormal; Notable for the following components:   Glucose-Capillary 105 (*)    All other components within normal limits  POC OCCULT BLOOD, ED - Abnormal; Notable for the following components:   Fecal Occult Bld POSITIVE (*)    All other components within normal limits  SARS CORONAVIRUS 2 (TAT 6-24 HRS)  PROTIME-INR  MAGNESIUM  PHOSPHORUS  HEMOGLOBIN A1C  HEMOGLOBIN AND HEMATOCRIT, BLOOD  HEMOGLOBIN AND HEMATOCRIT, BLOOD  CBC  COMPREHENSIVE METABOLIC PANEL  TYPE AND SCREEN                                                                                                                         EKG  EKG Interpretation  Date/Time:  Friday August 15 2020 00:36:02 EDT Ventricular Rate:  71 PR Interval:  164 QRS Duration: 89 QT Interval:  415 QTC Calculation: 451 R Axis:   57 Text Interpretation: Sinus rhythm No old tracing to compare Confirmed by [place] Lank [phone]) on 08/15/2020 12:46:58 AM        Radiology DG Chest Port 1 View  Result Date: 08/15/2020 CLINICAL DATA:  Bright red blood per rectum EXAM: PORTABLE CHEST 1 VIEW COMPARISON:  CT 07/09/2018 FINDINGS: Two  coarsening of the a pulmonary interstitium, particular within the lung apices, is in keeping with changes of underlying emphysema better appreciated on prior CT examination. No superimposed focal pulmonary infiltrate. No pneumothorax or pleural effusion. Cardiac size within normal limits. Pulmonary vascularity is normal. No acute bone abnormality. IMPRESSION: No active disease.  Emphysema. Electronically Signed   By: Fidela Salisbury MD   On: 08/15/2020 02:11    Pertinent labs & imaging results that were available during my care of the patient were reviewed by me and considered in my medical decision making (see chart for details).  Medications Ordered in  ED Medications  lactated ringers infusion ( Intravenous Rate/Dose Verify 08/15/20 0309)  ferrous sulfate tablet 325 mg (has no administration in time range)  latanoprost (XALATAN) 0.005 % ophthalmic solution 1 drop (has no administration in time range)  rosuvastatin (CRESTOR) tablet 5 mg (has no administration in time range)  insulin aspart (novoLOG) injection 0-9 Units (0 Units Subcutaneous Not Given 08/15/20 0623)  insulin aspart (novoLOG) injection 0-5 Units (has no administration in time range)  mupirocin ointment (BACTROBAN) 2 % 1 application (has no administration in time range)  Chlorhexidine Gluconate Cloth 2 % PADS 6 each (6 each Topical Given 08/15/20 0655)  sodium chloride 0.9 % bolus 1,000 mL (0 mLs Intravenous Stopped 08/15/20 0200)                                                                                                                                    Procedures .1-3 Lead EKG Interpretation  Date/Time: 08/15/2020 7:47 AM Performed by: Fatima Blank, MD Authorized by: Fatima Blank, MD     Interpretation: normal     ECG rate:  78   ECG rate assessment: normal     Rhythm: sinus rhythm     Ectopy: none     Conduction: normal   .Critical Care  Date/Time: 08/15/2020 7:47 AM Performed by: Fatima Blank, MD Authorized by: Fatima Blank, MD   Critical care provider statement:    Critical care time (minutes):  45   Critical care was necessary to treat or prevent imminent or life-threatening deterioration of the following conditions:  Circulatory failure   Critical care was time spent personally by me on the following activities:  Discussions with consultants, evaluation of patient's response to treatment, examination of patient, ordering and performing treatments and interventions, ordering and review of laboratory studies, ordering and review of radiographic studies, pulse oximetry, re-evaluation of patient's condition, obtaining history from patient or surrogate and review of old charts   Care discussed with: admitting provider    (including critical care time)  Medical Decision Making / ED Course I have reviewed the nursing notes for this encounter and the patient's prior records (if available in EHR or on provided paperwork).   Zayvien Canning was evaluated in Emergency Department on 08/15/2020 for the symptoms described in the history of present illness. He was evaluated in the context of the global COVID-19 pandemic, which necessitated consideration that the patient might be at risk for infection with the SARS-CoV-2 virus that causes COVID-19. Institutional protocols and algorithms that pertain to the evaluation of patients at risk for COVID-19 are in a state of rapid change based on information released by regulatory bodies including the CDC and federal and state organizations. These policies and algorithms were followed during the patient's care in the ED.  Patient noted to be pale and diaphoretic and hypotensive.  Improved with IV fluids.  Hemoccult is positive.  Hemoglobin stable when  compared to previous.  EKG without acute ischemic changes, dysrhythmias, blocks.  No infectious symptoms.  Patient is not septic.  Admitted to medicine for continued work-up and  management.      Final Clinical Impression(s) / ED Diagnoses Final diagnoses:  Hypotension  Acute GI bleeding      This chart was dictated using voice recognition software.  Despite best efforts to proofread,  errors can occur which can change the documentation meaning.    Fatima Blank, MD 08/15/20 [phone]

## 2020-08-15 ENCOUNTER — Emergency Department (HOSPITAL_COMMUNITY): Payer: Medicare Other

## 2020-08-15 ENCOUNTER — Encounter (HOSPITAL_COMMUNITY): Payer: Self-pay | Admitting: Internal Medicine

## 2020-08-15 DIAGNOSIS — I10 Essential (primary) hypertension: Secondary | ICD-10-CM | POA: Diagnosis not present

## 2020-08-15 DIAGNOSIS — K922 Gastrointestinal hemorrhage, unspecified: Secondary | ICD-10-CM | POA: Diagnosis not present

## 2020-08-15 DIAGNOSIS — K625 Hemorrhage of anus and rectum: Secondary | ICD-10-CM

## 2020-08-15 DIAGNOSIS — I959 Hypotension, unspecified: Secondary | ICD-10-CM | POA: Diagnosis not present

## 2020-08-15 DIAGNOSIS — J439 Emphysema, unspecified: Secondary | ICD-10-CM | POA: Diagnosis not present

## 2020-08-15 LAB — COMPREHENSIVE METABOLIC PANEL
ALT: 15 U/L (ref 0–44)
ALT: 14 U/L (ref 0–44)
AST: 23 U/L (ref 15–41)
AST: 19 U/L (ref 15–41)
Albumin: 3.6 g/dL (ref 3.5–5.0)
Albumin: 2.9 g/dL — ABNORMAL LOW (ref 3.5–5.0)
Alkaline Phosphatase: 60 U/L (ref 38–126)
Alkaline Phosphatase: 50 U/L (ref 38–126)
Anion gap: 11 (ref 5–15)
Anion gap: 6 (ref 5–15)
BUN: 14 mg/dL (ref 8–23)
BUN: 13 mg/dL (ref 8–23)
CO2: 21 mmol/L — ABNORMAL LOW (ref 22–32)
CO2: 22 mmol/L (ref 22–32)
Calcium: 9.1 mg/dL (ref 8.9–10.3)
Calcium: 8.2 mg/dL — ABNORMAL LOW (ref 8.9–10.3)
Chloride: 103 mmol/L (ref 98–111)
Chloride: 109 mmol/L (ref 98–111)
Creatinine, Ser: 1.58 mg/dL — ABNORMAL HIGH (ref 0.61–1.24)
Creatinine, Ser: 1.03 mg/dL (ref 0.61–1.24)
GFR, Estimated: 44 mL/min — ABNORMAL LOW (ref >60)
GFR, Estimated: >60 mL/min (ref >60)
Glucose, Bld: 236 mg/dL — ABNORMAL HIGH (ref 70–99)
Glucose, Bld: 173 mg/dL — ABNORMAL HIGH (ref 70–99)
Potassium: 3.9 mmol/L (ref 3.5–5.1)
Potassium: 4 mmol/L (ref 3.5–5.1)
Sodium: 135 mmol/L (ref 135–145)
Sodium: 137 mmol/L (ref 135–145)
Total Bilirubin: 0.6 mg/dL (ref 0.3–1.2)
Total Bilirubin: 0.3 mg/dL (ref 0.3–1.2)
Total Protein: 6.4 g/dL — ABNORMAL LOW (ref 6.5–8.1)
Total Protein: 5.5 g/dL — ABNORMAL LOW (ref 6.5–8.1)

## 2020-08-15 LAB — GLUCOSE, CAPILLARY
Glucose-Capillary: 105 mg/dL — ABNORMAL HIGH (ref 70–99)
Glucose-Capillary: 119 mg/dL — ABNORMAL HIGH (ref 70–99)
Glucose-Capillary: 151 mg/dL — ABNORMAL HIGH (ref 70–99)
Glucose-Capillary: 87 mg/dL (ref 70–99)

## 2020-08-15 LAB — HEMOGLOBIN A1C
Hgb A1c MFr Bld: 5.3 % (ref 4.8–5.6)
Mean Plasma Glucose: 105.41 mg/dL

## 2020-08-15 LAB — CBC
HCT: 28.8 % — ABNORMAL LOW (ref 39.0–52.0)
Hemoglobin: 9 g/dL — ABNORMAL LOW (ref 13.0–17.0)
MCH: 28.4 pg (ref 26.0–34.0)
MCHC: 31.3 g/dL (ref 30.0–36.0)
MCV: 90.9 fL (ref 80.0–100.0)
Platelets: 211 10*3/uL (ref 150–400)
RBC: 3.17 MIL/uL — ABNORMAL LOW (ref 4.22–5.81)
RDW: 17.2 % — ABNORMAL HIGH (ref 11.5–15.5)
WBC: 10.7 10*3/uL — ABNORMAL HIGH (ref 4.0–10.5)
nRBC: 0 % (ref 0.0–0.2)

## 2020-08-15 LAB — TYPE AND SCREEN
ABO/RH(D): A POS
Antibody Screen: NEGATIVE

## 2020-08-15 LAB — URINALYSIS, ROUTINE W REFLEX MICROSCOPIC
Bilirubin Urine: NEGATIVE
Glucose, UA: 50 mg/dL — AB
Hgb urine dipstick: NEGATIVE
Ketones, ur: NEGATIVE mg/dL
Leukocytes,Ua: NEGATIVE
Nitrite: NEGATIVE
Protein, ur: NEGATIVE mg/dL
Specific Gravity, Urine: 1.019 (ref 1.005–1.030)
pH: 5 (ref 5.0–8.0)

## 2020-08-15 LAB — HEMOGLOBIN AND HEMATOCRIT, BLOOD
HCT: 31.3 % — ABNORMAL LOW (ref 39.0–52.0)
HCT: 27 % — ABNORMAL LOW (ref 39.0–52.0)
HCT: 26.1 % — ABNORMAL LOW (ref 39.0–52.0)
Hemoglobin: 9.4 g/dL — ABNORMAL LOW (ref 13.0–17.0)
Hemoglobin: 8.3 g/dL — ABNORMAL LOW (ref 13.0–17.0)
Hemoglobin: 8.1 g/dL — ABNORMAL LOW (ref 13.0–17.0)

## 2020-08-15 LAB — PHOSPHORUS: Phosphorus: 3.7 mg/dL (ref 2.5–4.6)

## 2020-08-15 LAB — MAGNESIUM: Magnesium: 2.2 mg/dL (ref 1.7–2.4)

## 2020-08-15 LAB — MRSA NEXT GEN BY PCR, NASAL: MRSA by PCR Next Gen: DETECTED — AB

## 2020-08-15 LAB — SARS CORONAVIRUS 2 (TAT 6-24 HRS): SARS Coronavirus 2: NEGATIVE

## 2020-08-15 LAB — POC OCCULT BLOOD, ED: Fecal Occult Bld: POSITIVE — AB

## 2020-08-15 MED ORDER — ROSUVASTATIN CALCIUM 5 MG PO TABS: 5.0000 mg | ORAL_TABLET | ORAL | Status: DC

## 2020-08-15 MED ORDER — LACTATED RINGERS IV SOLN: INTRAVENOUS | Status: AC

## 2020-08-15 MED ORDER — INSULIN ASPART 100 UNIT/ML IJ SOLN: 0.0000 [IU] | Freq: Every day | INTRAMUSCULAR | Status: DC

## 2020-08-15 MED ORDER — FERROUS SULFATE 325 (65 FE) MG PO TABS
325.0000 mg | ORAL_TABLET | Freq: Two times a day (BID) | ORAL | Status: DC
  Administered 2020-08-15 – 2020-08-16 (×3): 325 mg via ORAL
  Filled 2020-08-15 (×3): qty 1

## 2020-08-15 MED ORDER — LATANOPROST 0.005 % OP SOLN
1.0000 [drp] | Freq: Every day | OPHTHALMIC | Status: DC
  Administered 2020-08-15: 1 [drp] via OPHTHALMIC
  Filled 2020-08-15: qty 2.5

## 2020-08-15 MED ORDER — CHLORHEXIDINE GLUCONATE CLOTH 2 % EX PADS
6.0000 | MEDICATED_PAD | Freq: Every day | CUTANEOUS | Status: DC
  Administered 2020-08-15 – 2020-08-16 (×2): 6 via TOPICAL

## 2020-08-15 MED ORDER — INSULIN ASPART 100 UNIT/ML IJ SOLN
0.0000 [IU] | Freq: Three times a day (TID) | INTRAMUSCULAR | Status: DC
  Administered 2020-08-15: 1 [IU] via SUBCUTANEOUS

## 2020-08-15 MED ORDER — MUPIROCIN 2 % EX OINT
1.0000 "application " | TOPICAL_OINTMENT | Freq: Two times a day (BID) | CUTANEOUS | Status: DC
  Administered 2020-08-15 – 2020-08-16 (×3): 1 via NASAL
  Filled 2020-08-15: qty 22

## 2020-08-15 NOTE — Progress Notes (Signed)
Patient ID: Ryan Mosley, male   DOB: 07-25-1940, 80 y.o.   MRN: [id number] Patient known to Dr. [name]. 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  He was supposed to follow up with Dr. [name] to discuss a TAMIS but failed to follow up.  He is currently here with some recurrent bleeding from this lesion.  His hgb is stable in the 9-10 range up from 7 last admission.  This has been discussed with Dr. [name].  There is nothing to do acutely for this patient.  I will reschedule his appointment with Dr. [name], which he needs to keep in order to correct this problem.  This has been relayed to Dr. [name].  Ryan Mosley 11:32 AM 08/15/2020

## 2020-08-15 NOTE — ED Notes (Signed)
Urinal placed at bedside.

## 2020-08-15 NOTE — Progress Notes (Addendum)
[facility] PLAN OF CARE NOTE Patient: Ryan Mosley DPO:[id number]   PCP: Susy Frizzle, MD DOB: 1940-10-08   DOA: 08/14/2020   DOS: 08/15/2020    Patient was admitted by my colleague earlier on 08/15/2020. I have reviewed the H&P as well as assessment and plan and agree with the same. Important changes in the plan are listed below.  Plan of care: Active Problems:   GI bleed  07/09/20 00:51 07/17/20 11:38 08/14/20 23:11 08/15/20 02:10 08/15/20 08:24 08/15/20 14:40  Hemoglobin 7.7 (L) 10.1 (L) 10.8 (L) 9.4 (L) 9.0 (L) 8.3 (L)   On admission hemoglobin was 10.8.  Now gradually trending down likely dilutional in nature as the patient does not have any acute bleeding here in the hospital. Patient does not have any abdominal pain.  Discussed with general surgery.  High-grade dysplasia no cancer on biopsy on colonoscopy and flexible sigmoidoscopy in May 2022.  Patient needs to follow-up with Dr. Johney Maine on Wednesday. No acute intervention recommended by surgery for now. Patient was no-show during last admission for surgery.  But currently agreeable to go for the follow-up.  This was also relayed to wife and daughter.  Discussed with GI.  For now recommend to monitor.  Consult if hemoglobin continues to trend down or actually have bleeding.  Most likely bleeding is coming from the rectal mass for which the patient will require surgery.  A large, frond-like, non-obstructing polypoid lesion was found in the distal rectum. It nearly abutts the dentate line. No active bleeding was present. The lesion appears to involve at least 20-25% of the circumference. The mass measures at least 4cm at widest diameter. Author: Berle Mull, MD [facility] 08/15/2020 7:24 PM   If 7PM-7AM, please contact night-coverage at [email or web address]

## 2020-08-15 NOTE — H&P (Addendum)
History and Physical  Ryan Mosley JTT:[id number] DOB: 09-25-1940 DOA: 08/14/2020  Referring physician: Dr. Leonette Monarch, [place]. PCP: Susy Frizzle, MD  Outpatient Specialists: GI. Patient coming from: Home, lives with his wife.  Ambulates freely without assistance.  Chief Complaint: Painless rectal bleed  HPI: Ryan Mosley is a 80 y.o. male with medical history significant for recent lower GI bleed status post flexible sigmoidoscopy and colonoscopy on 07/08/20 revealing a rectal mass, diverticulosis without evidence of diverticulitis, anemia of chronic disease, iron deficiency anemia, essential hypertension, hyperlipidemia, BPH who presented to [facility] ED from home due to recurrent lower GI bleed.  Patient reports that on August 09, 2020 he had 2 episodes of painless hematochezia.  They resolved spontaneously.  Reports he had blood in his stool again today-3-4 bowel movements and painless.  Denies dizziness, palpitations, chest pain, dyspnea, any fevers, abdominal pain, nausea, or vomiting.  No use of NSAIDs or oral anticoagulation.  He presented to the ED for further evaluation.  In the ED he had a positive FOBT and was hypotensive with systolic BP in the 00P, responded well to IV fluid bolus.  [facility], hospitalist team, was asked to admit.  ED Course:  Temperature 98.8.  BP 113/75, pulse 65, respiration rate 17, O2 saturation 100% on room air.  Lab studies remarkable for WBC 14.1, hemoglobin 10.8, platelet 234, serum sodium 135, potassium 3.9, serum glucose 236, creatinine 1.58, serum bicarb 21, GFR 44.  COVID-19 screening test, UA and chest x-ray pending.  Review of Systems: Review of systems as noted in the HPI. All other systems reviewed and are negative.   Past Medical History:  Diagnosis Date   Acute blood loss anemia    on meds   Blood transfusion without reported diagnosis 2022   Cataract    bilateral sx   Diabetes mellitus without complication ([place])    not on meds at this time   GERD  (gastroesophageal reflux disease)    hx of   GI bleed    Hyperlipidemia    on meds   Hypertension    on meds   Meningitis 1960/1970   Pneumonia 1960/1970   Past Surgical History:  Procedure Laterality Date   BIOPSY  07/08/2020   Procedure: BIOPSY;  Surgeon: Thornton Park, MD;  Location: [place];  Service: Gastroenterology;;   COLONOSCOPY WITH PROPOFOL N/A 07/08/2020   Procedure: COLONOSCOPY WITH PROPOFOL;  Surgeon: Thornton Park, MD;  Location: [place];  Service: Gastroenterology;  Laterality: N/A;   DENTAL SURGERY     all teeth removed    Social History:  reports that he has quit smoking. His smokeless tobacco use includes chew. He reports current alcohol use of about 1.0 standard drink of alcohol per week. He reports that he does not use drugs.   No Known Allergies  Family History  Problem Relation Age of Onset   Seizures Daughter    Diabetes Son    Colon polyps Neg Hx    Colon cancer Neg Hx    Esophageal cancer Neg Hx    Rectal cancer Neg Hx    Stomach cancer Neg Hx   Brother with history of cancer, unspecified Sister history of cancer, unspecified.   Prior to Admission medications   Medication Sig Start Date End Date Taking? Authorizing Provider  acetaminophen (TYLENOL) 500 MG tablet Take 500 mg by mouth every morning.   Yes [provider]  amLODipine (NORVASC) 5 MG tablet TAKE 1 TABLET BY MOUTH EVERY DAY FOR BLOOD PRESSURE Patient taking  differently: Take 5 mg by mouth daily. 03/17/20  Yes [place], Modena Nunnery, MD  ferrous sulfate 325 (65 FE) MG EC tablet Take 1 tablet (325 mg total) by mouth 2 (two) times daily. 07/09/20 07/09/21 Yes Ghimire, Henreitta Leber, MD  latanoprost (XALATAN) 0.005 % ophthalmic solution Place 1 drop into both eyes at bedtime. 06/13/17  Yes [provider]  Pseudoephedrine-Ibuprofen (ADVIL COLD/SINUS PO) Take 1 tablet by mouth daily as needed (sinus relief).   Yes [provider]  rosuvastatin (CRESTOR) 5 MG  tablet TAKE 1 TABLET BY MOUTH ONCE A WEEK FOR CHOLESTEROL Patient taking differently: Take 5 mg by mouth every Monday. 03/17/20  Yes , Modena Nunnery, MD  tamsulosin (FLOMAX) 0.4 MG CAPS capsule TAKE 1 CAPSULE BY MOUTH EVERY DAY Patient not taking: No sig reported 06/13/20   Alycia Rossetti, MD    Physical Exam: BP 120/70   Pulse 65   Temp 98.8 F (37.1 C) (Oral)   Resp 11   SpO2 100%   General: 80 y.o. year-old male well developed well nourished in no acute distress.  Alert and oriented x3. Cardiovascular: Regular rate and rhythm with no rubs or gallops.  No thyromegaly or JVD noted.  No lower extremity edema. 2/4 pulses in all 4 extremities. Respiratory: Clear to auscultation with no wheezes or rales. Good inspiratory effort. Abdomen: Soft nontender nondistended with normal bowel sounds x4 quadrants. Muskuloskeletal: No cyanosis, clubbing or edema noted bilaterally Neuro: CN II-XII intact, strength, sensation, reflexes Skin: No ulcerative lesions noted or rashes Psychiatry: Judgement and insight appear normal. Mood is appropriate for condition and setting          Labs on Admission:  Basic Metabolic Panel: Recent Labs  Lab 08/14/20 2311  NA 135  K 3.9  CL 103  CO2 21*  GLUCOSE 236*  BUN 14  CREATININE 1.58*  CALCIUM 9.1   Liver Function Tests: Recent Labs  Lab 08/14/20 2311  AST 23  ALT 15  ALKPHOS 60  BILITOT 0.6  PROT 6.4*  ALBUMIN 3.6   No results for input(s): LIPASE, AMYLASE in the last 168 hours. No results for input(s): AMMONIA in the last 168 hours. CBC: Recent Labs  Lab 08/14/20 2311  WBC 14.1*  NEUTROABS 9.9*  HGB 10.8*  HCT 34.5*  MCV 91.8  PLT 234   Cardiac Enzymes: No results for input(s): CKTOTAL, CKMB, CKMBINDEX, TROPONINI in the last 168 hours.  BNP (last 3 results) No results for input(s): BNP in the last 8760 hours.  ProBNP (last 3 results) No results for input(s): PROBNP in the last 8760 hours.  CBG: No results for  input(s): GLUCAP in the last 168 hours.  Radiological Exams on Admission: No results found.  EKG: I independently viewed the EKG done and my findings are as followed: Sinus rhythm rate of 71.  QTc 451.  Nonspecific ST-T changes  Assessment/Plan Present on Admission:  GI bleed  Active Problems:   GI bleed  Acute painless hematochezia Presented with painless hematochezia on 08/09/2020 x 2 episodes, they resolved spontaneously.  Symptoms recurred on the day of presentation 3-4 bowel movements with blood inside the stools and painless, described by the patient. Positive FOBT in the ED, hemoglobin 10.8, MCV 91.8. Serial H&H every 6 hours Type and screen Transfuse as indicated. Recent CT angio abdomen pelvis with contrast on 07/07/2020, will hold off on repeat imaging for now. Please consult GI in the morning  Recent diagnosis of rectal mass with concern for malignancy Rectal  mass seen on flexible sigmoidoscopy and colonoscopy done on 07/08/2020. 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 Please consult general surgery and medical oncology, Dr. Burr Medico, in the morning.  AKI suspect prerenal in the setting of dehydration. Baseline creatinine appears to be 0.9 with GFR greater than 60. Presented with creatinine of 1.58 with GFR 44 Start IV fluid hydration LR 100 cc/h. Avoid nephrotoxic agent, dehydration and hypotension. Repeat renal panel in the morning.  Type 2 diabetes with hyperglycemia Last hemoglobin A1c 6.9 on 03/17/2020 Repeat hemoglobin A1c Start insulin sliding scale.  Essential hypertension, recently hypotensive On presentation systolic blood pressure in the 60 Responded well to IV fluid bolus. Hold off home oral antihypertensives for now to avoid recurrence of hypotension Monitor vital signs Maintain MAP greater than 65 in the setting of GI  bleed.  Iron deficiency anemia/anemia of chronic disease Resume home ferrous sulfate    DVT prophylaxis: SCDs.  Not on pharmacological DVT prophylaxis due to lower GI bleed.  Code Status: Full code.  Family Communication: None at bedside.  Disposition Plan: Admit to progressive unit.  Consults called: Please consult GI, general surgery, and medical oncology, Dr. Burr Medico, in the morning.  Admission status: Observation status.   Status is: Observation    Dispo:  Patient From: Home  Planned Disposition: Home, possibly on 08/16/2020 or when GI, general surgery, and medical oncology have signed off.  Medically stable for discharge: No      Kayleen Memos MD [facility] Pager [phone]  If 7PM-7AM, please contact night-coverage [email or web address] Password [email or web address]  08/15/2020, 1:55 AM

## 2020-08-15 NOTE — Plan of Care (Signed)
  Problem: Education: Goal: Knowledge of General Education information will improve Description: Including pain rating scale, medication(s)/side effects and non-pharmacologic comfort measures Outcome: Progressing   Problem: Health Behavior/Discharge Planning: Goal: Ability to manage health-related needs will improve Outcome: Progressing   Problem: Clinical Measurements: Goal: Ability to maintain clinical measurements within normal limits will improve Outcome: Progressing   Problem: Clinical Measurements: Goal: Diagnostic test results will improve Outcome: Progressing   Problem: Nutrition: Goal: Adequate nutrition will be maintained Outcome: Progressing   Problem: Elimination: Goal: Will not experience complications related to bowel motility Outcome: Progressing   Problem: Pain Managment: Goal: General experience of comfort will improve Outcome: Progressing   Problem: Education: Goal: Ability to identify signs and symptoms of gastrointestinal bleeding will improve Outcome: Progressing   Problem: Bowel/Gastric: Goal: Will show no signs and symptoms of gastrointestinal bleeding Outcome: Progressing   Problem: Fluid Volume: Goal: Will show no signs and symptoms of excessive bleeding Outcome: Progressing   Problem: Clinical Measurements: Goal: Complications related to the disease process, condition or treatment will be avoided or minimized Outcome: Progressing

## 2020-08-16 DIAGNOSIS — K922 Gastrointestinal hemorrhage, unspecified: Secondary | ICD-10-CM | POA: Diagnosis not present

## 2020-08-16 LAB — CBC
HCT: 26.5 % — ABNORMAL LOW (ref 39.0–52.0)
Hemoglobin: 8.3 g/dL — ABNORMAL LOW (ref 13.0–17.0)
MCH: 28.1 pg (ref 26.0–34.0)
MCHC: 31.3 g/dL (ref 30.0–36.0)
MCV: 89.8 fL (ref 80.0–100.0)
Platelets: 180 10*3/uL (ref 150–400)
RBC: 2.95 MIL/uL — ABNORMAL LOW (ref 4.22–5.81)
RDW: 17.1 % — ABNORMAL HIGH (ref 11.5–15.5)
WBC: 8.7 10*3/uL (ref 4.0–10.5)
nRBC: 0 % (ref 0.0–0.2)

## 2020-08-16 LAB — GLUCOSE, CAPILLARY: Glucose-Capillary: 105 mg/dL — ABNORMAL HIGH (ref 70–99)

## 2020-08-16 LAB — COMPREHENSIVE METABOLIC PANEL
ALT: 14 U/L (ref 0–44)
AST: 16 U/L (ref 15–41)
Albumin: 3 g/dL — ABNORMAL LOW (ref 3.5–5.0)
Alkaline Phosphatase: 48 U/L (ref 38–126)
Anion gap: 3 — ABNORMAL LOW (ref 5–15)
BUN: 7 mg/dL — ABNORMAL LOW (ref 8–23)
CO2: 27 mmol/L (ref 22–32)
Calcium: 8.3 mg/dL — ABNORMAL LOW (ref 8.9–10.3)
Chloride: 110 mmol/L (ref 98–111)
Creatinine, Ser: 0.87 mg/dL (ref 0.61–1.24)
GFR, Estimated: >60 mL/min (ref >60)
Glucose, Bld: 103 mg/dL — ABNORMAL HIGH (ref 70–99)
Potassium: 3.8 mmol/L (ref 3.5–5.1)
Sodium: 140 mmol/L (ref 135–145)
Total Bilirubin: 0.4 mg/dL (ref 0.3–1.2)
Total Protein: 5.4 g/dL — ABNORMAL LOW (ref 6.5–8.1)

## 2020-08-16 NOTE — Care Management Obs Status (Signed)
[facility] NOTIFICATION   Patient Details  Name: Ryan Mosley MRN: [id number] Date of Birth: 05/01/1940   Medicare Observation Status Notification Given:  Yes    Bethena Roys, RN 08/16/2020, 9:38 AM

## 2020-08-16 NOTE — Discharge Summary (Signed)
Physician Discharge Summary  Ryan Mosley UEA:[id number] DOB: [birth year] DOA: 08/14/2020  PCP: Susy Frizzle, MD  Admit date: 08/14/2020 Discharge date: 08/16/2020  Admitted From: Home Disposition: [place]: No Equipment/Devices: No Discharge Condition: Stable CODE STATUS: Full code Diet recommendation: Heart healthy  Hospital course: 80 year old male with a past medical history significant for recent lower GI bleed status post flex sig and colonoscopy on 07/08/2020 revealing a rectal mass, diverticulosis without evidence of diverticulitis, anemia of chronic disease, iron deficiency anemia, essential hypertension, hyperlipidemia, BPH presented to [facility] ED from home due to recurrent lower GI bleed.  Patient reported that he had 2 episodes on August 09, 2020 of painless hematochezia.  They resolve spontaneously.  He also reported again blood in the stool the day of presentation with 3-4 bowel movements that was painless.  No use of NSAIDs  or oral anticoagulation.  He presented to the ED for further evaluation.  In the emergency department his FOBT was positive.  He was initially hypotensive but responded to IV fluid resuscitation.  The hospitalist team was asked to admit.  Show hemoglobin is 10.8 but received IV fluids therefore likely [place]us[place].  Hemoglobin from yesterday and today has remained stable.  No signs of acute bleed while here in the hospital.  Does not have any abdominal pain.  This patient was discussed with general surgery.  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  Patient needs to follow-up with Dr. Johney Maine on Wednesday.  They recommended no acute intervention.  He was a no-show for his last visit for surgery.  The case was discussed with GI and they recommended to monitor and if hemoglobin continues to trend down or actually having bleeding they recommended consulting at that point.  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 It nearly abutts the dentate line. No active bleeding was present. The lesion appears to involve at least 20-25% of the circumference. The mass measures at least 4cm at widest diameter.  The patient is asking to go home today.  He will follow-up with general surgery outpatient to discuss surgical removal of his rectal mass.  Discharge Diagnoses:  Active Problems:   GI bleed    Discharge Instructions  Discharge Instructions     Call MD for:  persistant dizziness or light-headedness   Complete by: As directed    Diet - low sodium heart healthy   Complete by: As directed    Discharge instructions   Complete by: As directed    1) Follow up with surgery, Dr. Johney Maine on Wednesday 2) Follow up with PCP 1-2 weeks upon discharge 3) Call 911 or come to the nearest ER if you start feeling dizzy, lightheaded or having profuse rectal bleeding   Increase activity slowly   Complete by: As directed       Allergies as of 08/16/2020   No Known Allergies      Medication List     STOP taking these medications    tamsulosin 0.4 MG Caps capsule Commonly known as: FLOMAX       TAKE these medications    acetaminophen 500 MG tablet Commonly known as: TYLENOL Take 500 mg by mouth every morning.   ADVIL COLD/SINUS PO Take 1 tablet by mouth daily as needed (sinus relief).   amLODipine 5 MG tablet Commonly known  as: NORVASC TAKE 1 TABLET BY MOUTH EVERY DAY FOR BLOOD PRESSURE What changed:  how much to take how to take this when to take this additional instructions   ferrous sulfate 325 (65 FE) MG EC tablet Take 1 tablet (325 mg total) by mouth 2 (two) times daily.   latanoprost 0.005 % ophthalmic solution Commonly known as: XALATAN Place 1 drop into both eyes at bedtime.   rosuvastatin 5 MG tablet Commonly known as: CRESTOR TAKE 1 TABLET BY MOUTH  ONCE A WEEK FOR CHOLESTEROL What changed:  how much to take how to take this when to take this additional instructions        Follow-up Information     Michael [place], MD Follow up on 08/20/2020.   Specialties: General Surgery, Colon and Rectal Surgery Why: 11:30am, arrive by 11:00am for paperwork and check in process Contact information: [place] [address] [phone]                No Known Allergies  Consultations: General surgery   Procedures/Studies: DG Chest Port 1 View  Result Date: 08/15/2020 CLINICAL DATA:  Bright red blood per rectum EXAM: PORTABLE CHEST 1 VIEW COMPARISON:  CT 07/09/2018 FINDINGS: Two coarsening of the a pulmonary interstitium, particular within the lung apices, is in keeping with changes of underlying emphysema better appreciated on prior CT examination. No superimposed focal pulmonary infiltrate. No pneumothorax or pleural effusion. Cardiac size within normal limits. Pulmonary vascularity is normal. No acute bone abnormality. IMPRESSION: No active disease.  Emphysema. Electronically Signed   By: Fidela Salisbury MD   On: 08/15/2020 02:11      Discharge Exam: Vitals:   08/16/20 0404 08/16/20 0718  BP: 140/82 (!) 157/74  Pulse: 73 65  Resp: 14 19  Temp: 98.8 F (37.1 C) 98.3 F (36.8 C)  SpO2: 98% 93%   Vitals:   08/15/20 1922 08/15/20 2346 08/16/20 0404 08/16/20 0718  BP: 121/64 124/71 140/82 (!) 157/74  Pulse: 60 64 73 65  Resp: 13 15 14 19   Temp: 98.6 F (37 C) 98.3 F (36.8 C) 98.8 F (37.1 C) 98.3 F (36.8 C)  TempSrc: Oral Oral Oral Oral  SpO2: 98% 98% 98% 93%  Weight:      Height:        General: Pt is alert, awake, not in acute distress Cardiovascular: RRR, S1/S2 +, no rubs, no gallops Respiratory: CTA bilaterally, no wheezing, no rhonchi Abdominal: Soft, NT, ND, bowel sounds + Extremities: no edema, no cyanosis    The results of significant diagnostics from this hospitalization  (including imaging, microbiology, ancillary and laboratory) are listed below for reference.     Microbiology: Recent Results (from the past 240 hour(s))  SARS CORONAVIRUS 2 (TAT 6-24 HRS) Nasopharyngeal Nasopharyngeal Swab     Status: None   Collection Time: 08/15/20 12:10 AM   Specimen: Nasopharyngeal Swab  Result Value Ref Range Status   SARS Coronavirus 2 NEGATIVE NEGATIVE Final    Comment: (NOTE) SARS-CoV-2 target nucleic acids are NOT DETECTED.  The SARS-CoV-2 RNA is generally detectable in upper and lower respiratory specimens during the acute phase of infection. Negative results do not preclude SARS-CoV-2 infection, do not rule out co-infections with other pathogens, and should not be used as the sole basis for treatment or other patient management decisions. Negative results must be combined with clinical observations, patient history, and epidemiological information. The expected result is Negative.  Fact Sheet for Patients: SugarRoll.be  Fact  Sheet for Healthcare Providers: https://www.woods-mathews.com/  This test is not yet approved or cleared by the [place] FDA and  has been authorized for detection and/or diagnosis of SARS-CoV-2 by FDA under an Emergency Use Authorization (EUA). This EUA will remain  in effect (meaning this test can be used) for the duration of the COVID-19 declaration under Se ction 564(b)(1) of the Act, 21 U.S.C. section 360bbb-3(b)(1), unless the authorization is terminated or revoked sooner.  Performed at [facility], [place] [address] [place], [place] [address]   MRSA Next Gen by PCR, Nasal     Status: Abnormal   Collection Time: 08/15/20  3:14 AM   Specimen: Nasal Mucosa; Nasal Swab  Result Value Ref Range Status   MRSA by PCR Next Gen DETECTED (A) NOT DETECTED Final    Comment: RESULT CALLED TO, READ BACK BY AND VERIFIED WITH: RN S [facility] [id number] AT 263 AM BY CM (NOTE) The GeneXpert  MRSA Assay (FDA approved for NASAL specimens only), is one component of a comprehensive MRSA colonization surveillance program. It is not intended to diagnose MRSA infection nor to guide or monitor treatment for MRSA infections. Test performance is not FDA approved in patients less than 57 years old. Performed at [facility], [place] [address], [place], [place] [address]      Labs: BNP (last 3 results) No results for input(s): BNP in the last 8760 hours. Basic Metabolic Panel: Recent Labs  Lab 08/14/20 2311 08/15/20 0513 08/15/20 0824 08/16/20 0033  NA 135  --  137 140  K 3.9  --  4.0 3.8  CL 103  --  109 110  CO2 21*  --  22 27  GLUCOSE 236*  --  173* 103*  BUN 14  --  13 7*  CREATININE 1.58*  --  1.03 0.87  CALCIUM 9.1  --  8.2* 8.3*  MG  --  2.2  --   --   PHOS  --  3.7  --   --    Liver Function Tests: Recent Labs  Lab 08/14/20 2311 08/15/20 0824 08/16/20 0033  AST 23 19 16   ALT 15 14 14   ALKPHOS 60 50 48  BILITOT 0.6 0.3 0.4  PROT 6.4* 5.5* 5.4*  ALBUMIN 3.6 2.9* 3.0*   No results for input(s): LIPASE, AMYLASE in the last 168 hours. No results for input(s): AMMONIA in the last 168 hours. CBC: Recent Labs  Lab 08/14/20 2311 08/15/20 0210 08/15/20 0824 08/15/20 1440 08/15/20 2008 08/16/20 0033  WBC 14.1*  --  10.7*  --   --  8.7  NEUTROABS 9.9*  --   --   --   --   --   HGB 10.8* 9.4* 9.0* 8.3* 8.1* 8.3*  HCT 34.5* 31.3* 28.8* 27.0* 26.1* 26.5*  MCV 91.8  --  90.9  --   --  89.8  PLT 234  --  211  --   --  180   Cardiac Enzymes: No results for input(s): CKTOTAL, CKMB, CKMBINDEX, TROPONINI in the last 168 hours. BNP: Invalid input(s): POCBNP CBG: Recent Labs  Lab 08/15/20 0622 08/15/20 1136 08/15/20 1658 08/15/20 2100 08/16/20 0638  GLUCAP 105* 119* 151* 87 105*   D-Dimer No results for input(s): DDIMER in the last 72 hours. Hgb A1c Recent Labs    08/15/20 0220  HGBA1C 5.3   Lipid Profile No results for input(s): CHOL, HDL,  LDLCALC, TRIG, CHOLHDL, LDLDIRECT in the last 72 hours. Thyroid function studies  No results for input(s): TSH, T4TOTAL, T3FREE, THYROIDAB in the last 72 hours.  Invalid input(s): FREET3 Anemia work up No results for input(s): VITAMINB12, FOLATE, FERRITIN, TIBC, IRON, RETICCTPCT in the last 72 hours. Urinalysis    Component Value Date/Time   COLORURINE YELLOW 08/15/2020 0237   APPEARANCEUR HAZY (A) 08/15/2020 0237   LABSPEC 1.019 08/15/2020 0237   PHURINE 5.0 08/15/2020 0237   GLUCOSEU 50 (A) 08/15/2020 0237   HGBUR NEGATIVE 08/15/2020 0237   BILIRUBINUR NEGATIVE 08/15/2020 0237   KETONESUR NEGATIVE 08/15/2020 0237   PROTEINUR NEGATIVE 08/15/2020 0237   NITRITE NEGATIVE 08/15/2020 0237   LEUKOCYTESUR NEGATIVE 08/15/2020 0237   Sepsis Labs Invalid input(s): PROCALCITONIN,  WBC,  LACTICIDVEN Microbiology Recent Results (from the past 240 hour(s))  SARS CORONAVIRUS 2 (TAT 6-24 HRS) Nasopharyngeal Nasopharyngeal Swab     Status: None   Collection Time: 08/15/20 12:10 AM   Specimen: Nasopharyngeal Swab  Result Value Ref Range Status   SARS Coronavirus 2 NEGATIVE NEGATIVE Final    Comment: (NOTE) SARS-CoV-2 target nucleic acids are NOT DETECTED.  The SARS-CoV-2 RNA is generally detectable in upper and lower respiratory specimens during the acute phase of infection. Negative results do not preclude SARS-CoV-2 infection, do not rule out co-infections with other pathogens, and should not be used as the sole basis for treatment or other patient management decisions. Negative results must be combined with clinical observations, patient history, and epidemiological information. The expected result is Negative.  Fact Sheet for Patients: SugarRoll.be  Fact Sheet for Healthcare Providers: https://www.woods-mathews.com/  This test is not yet approved or cleared by the [place] FDA and  has been authorized for detection and/or diagnosis of  SARS-CoV-2 by FDA under an Emergency Use Authorization (EUA). This EUA will remain  in effect (meaning this test can be used) for the duration of the COVID-19 declaration under Se ction 564(b)(1) of the Act, 21 U.S.C. section 360bbb-3(b)(1), unless the authorization is terminated or revoked sooner.  Performed at [facility], [place] [address], [place], [place] [address]   MRSA Next Gen by PCR, Nasal     Status: Abnormal   Collection Time: 08/15/20  3:14 AM   Specimen: Nasal Mucosa; Nasal Swab  Result Value Ref Range Status   MRSA by PCR Next Gen DETECTED (A) NOT DETECTED Final    Comment: RESULT CALLED TO, READ BACK BY AND VERIFIED WITH: RN S [facility] [id number] AT 638 AM BY CM (NOTE) The GeneXpert MRSA Assay (FDA approved for NASAL specimens only), is one component of a comprehensive MRSA colonization surveillance program. It is not intended to diagnose MRSA infection nor to guide or monitor treatment for MRSA infections. Test performance is not FDA approved in patients less than 45 years old. Performed at [facility], [place] [address], [place], [place] [address]      Time coordinating discharge: 45 minutes  SIGNED:   Leslee Home, MD  [facility] 08/16/2020, 4:41 PM Pager   If 7PM-7AM, please contact night-coverage [email or web address] Password [email or web address]

## 2020-08-16 NOTE — Progress Notes (Signed)
PT Cancellation Note  Patient Details Name: Ryan Mosley MRN: [id number] DOB: 1941/01/03   Cancelled Treatment:    Reason Eval/Treat Not Completed: Other (comment).  Pt d/c prior to PT evaluation.   Thanks,  Verdene Lennert, PT, DPT  Acute Rehabilitation Ortho Tech Supervisor [phone] pager #[phone] office      [name] 08/16/2020, 11:03 AM

## 2020-08-20 ENCOUNTER — Ambulatory Visit: Payer: Self-pay | Admitting: Surgery

## 2020-08-20 DIAGNOSIS — D128 Benign neoplasm of rectum: Secondary | ICD-10-CM | POA: Diagnosis not present

## 2020-08-20 NOTE — H&P (Signed)
[name] Appointment: 08/20/2020 11:30 AM Location: [facility] Patient #: [id number] DOB: 1940/06/13 Married / Language: English / Race: Black or African American Male  History of Present Illness Adin Hector MD; 08/20/2020 12:49 PM) The patient is a 80 year old male who presents with a colorectal polyp. Note for "Colorectal polyp": ` ` ` Patient sent for surgical consultation at the request of Dr. Karmen Bongo gastroenterology  Chief Complaint: Rectal bleeding with adenomatous polyp. ` ` The patient is an elderly active male.  He comes today with his wife.  He had episode of rectal bleeding that was concerning.  Was admitted and underwent colonoscopy.  Out to have friable distal rectal mass.  Polyp showed adenomatous polyp.  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  Concern for possible focus of adenocarcinoma but not certain.  CT scan showed no obvious mass or metastatic disease.  Discussions made for her options by gastroenterology.  Surgical consult requested.  Initially to see Dr. Dema Severin but was switched over to me given my expertise in transanal TAMIS/TEM resections.  Patient did not show for appointment last month.  There is some confusion.  Notes from the cancer center working to try and set up appointment.  Notes from his primary care physician was trying to get an appointment.  He had an episode of rectal bleeding again was in the hospital again last week.  Seen by one of our PAs in the hospital who helped work to get the appointment within a week.  Patient comes in with his wife.  She immediately started ask questions before I could close the door and introduced myself.  Questions about the mupirocin and other concerns related to the rectal polyp.  I tried answer a few questions and redirect.  Patient denies much history of rectal bleeding until the past month.  Usually moves his bowels twice a day.  He claims he can walk a half hour without  difficulty.  No cardiac or pulmonary issues that he recalls.  He does not smoke.  He's been told he has diabetes.  He was tried on an oral hypoglycemic.  He says metformin, records a glipizide.  He notes he had worsening diarrhea so he's trying to manage it without medicines.  Sounds like he checks his glucose is home and they're less than 120.  Hemoglobin A1c was 5.3 in the hospital.  No sleep apnea.  No prior abdominal surgery.  He never had any prior colonoscopies.  No personal nor family history of GI/colon cancer, inflammatory bowel disease, irritable bowel syndrome, allergy such as Celiac Sprue, dietary/dairy problems, colitis, ulcers nor gastritis.  No recent sick contacts/gastroenteritis.  No travel outside the country.  No changes in diet.  No dysphagia to solids or liquids.  No significant heartburn or reflux.  No melena, hematemesis, coffee ground emesis.  No evidence of prior gastric/peptic ulceration.  (Review of systems as stated in this history (HPI) or in the review of systems.  Otherwise all other 12 point ROS are negative) ` ` ###########################################`  This patient encounter took 40 minutes today to perform the following: obtain history, perform exam, review outside records, interpret tests & imaging, counsel the patient on their diagnosis; and, document this encounter, including findings & plan in the electronic health record (EHR).   Past Surgical History (Charmella Little, CNA; 08/20/2020 11:48 AM) No pertinent past surgical history   Allergies (Charmella Little, CNA; 08/20/2020 11:49 AM) No Known Drug Allergies  [08/20/2020]:  Medication History (  Charmella Little, CNA; 08/20/2020 11:49 AM) amLODIPine Besylate  (5MG  Tablet, Oral) Active. Latanoprost  (0.005% Solution, Ophthalmic) Active. Lisinopril  (40MG  Tablet, Oral) Active. Rosuvastatin Calcium  (5MG  Tablet, Oral) Active. Tamsulosin HCl  (0.4MG  Capsule, Oral) Active.  Social History (Charmella Little,  CNA; 08/20/2020 11:49 AM) No alcohol use  No caffeine use  No drug use  Tobacco use  Former smoker.  Family History (Charmella Little, CNA; 08/20/2020 11:48 AM) Family history unknown  First Degree Relatives   Other Problems (Charmella Little, CNA; 08/20/2020 11:49 AM) No pertinent past medical history      Review of Systems (Charmella Little CNA; 08/20/2020 11:49 AM) General Not Present- Appetite Loss, Chills, Fatigue, Fever, Night Sweats, Weight Gain and Weight Loss. Skin Not Present- Change in Wart/Mole, Dryness, Hives, Jaundice, New Lesions, Non-Healing Wounds, Rash and Ulcer. HEENT Not Present- Earache, Hearing Loss, Hoarseness, Nose Bleed, Oral Ulcers, Ringing in the Ears, Seasonal Allergies, Sinus Pain, Sore Throat, Visual Disturbances, Wears glasses/contact lenses and Yellow Eyes. Respiratory Not Present- Bloody sputum, Chronic Cough, Difficulty Breathing, Snoring and Wheezing. Breast Not Present- Breast Mass, Breast Pain, Nipple Discharge and Skin Changes. Cardiovascular Not Present- Chest Pain, Difficulty Breathing Lying Down, Leg Cramps, Palpitations, Rapid Heart Rate, Shortness of Breath and Swelling of Extremities. Gastrointestinal Not Present- Abdominal Pain, Bloating, Bloody Stool, Change in Bowel Habits, Chronic diarrhea, Constipation, Difficulty Swallowing, Excessive gas, Gets full quickly at meals, Hemorrhoids, Indigestion, Nausea, Rectal Pain and Vomiting. Male Genitourinary Not Present- Blood in Urine, Change in Urinary Stream, Frequency, Impotence, Nocturia, Painful Urination, Urgency and Urine Leakage. Musculoskeletal Not Present- Back Pain, Joint Pain, Joint Stiffness, Muscle Pain, Muscle Weakness and Swelling of Extremities. Neurological Not Present- Decreased Memory, Fainting, Headaches, Numbness, Seizures, Tingling, Tremor, Trouble walking and Weakness. Psychiatric Not Present- Anxiety, Bipolar, Change in Sleep Pattern, Depression, Fearful and Frequent  crying. Endocrine Not Present- Cold Intolerance, Excessive Hunger, Hair Changes, Heat Intolerance, Hot flashes and New Diabetes. Hematology Not Present- Blood Thinners, Easy Bruising, Excessive bleeding, Gland problems, HIV and Persistent Infections.  Vitals (Charmella Little CNA; 08/20/2020 11:49 AM) 08/20/2020 11:49 AM Weight: 162.25 lb   Height: 69 in  Body Surface Area: 1.89 m   Body Mass Index: 23.96 kg/m   Temp.: 98.4 F    Pulse: 89 (Regular)    P.OX: 100% (Room air) BP: 150/78(Sitting, Left Arm, Standard)        Physical Exam Adin Hector MD; 08/20/2020 12:46 PM)  General Mental Status - Alert. General Appearance - Not in acute distress, Not Sickly. Orientation - Oriented X3. Hydration - Well hydrated. Voice - Normal. Note:  Moves around slowly but steadily.  Integumentary Global Assessment Upon inspection and palpation of skin surfaces of the - Axillae: non-tender, no inflammation or ulceration, no drainage. and Distribution of scalp and body hair is normal. General Characteristics Temperature - normal warmth is noted.  Head and Neck Head - normocephalic, atraumatic with no lesions or palpable masses. Face Global Assessment - atraumatic, no absence of expression. Neck Global Assessment - no abnormal movements, no bruit auscultated on the right, no bruit auscultated on the left, no decreased range of motion, non-tender. Trachea - midline. Thyroid Gland Characteristics - non-tender.  Eye Eyeball - Left - Extraocular movements intact, No Nystagmus - Left. Eyeball - Right - Extraocular movements intact, No Nystagmus - Right. Cornea - Left - No Hazy - Left. Cornea - Right - No Hazy - Right. Sclera/Conjunctiva - Left - No scleral icterus, No Discharge - Left. Sclera/Conjunctiva - Right -  No scleral icterus, No Discharge - Right. Pupil - Left - Direct reaction to light normal. Pupil - Right - Direct reaction to light normal. Note:  Wears glasses. Vision  corrected  ENMT Ears Pinna - Left - no drainage observed, no generalized tenderness observed. Pinna - Right - no drainage observed, no generalized tenderness observed. Nose and Sinuses External Inspection of the Nose - no destructive lesion observed. Inspection of the nares - Left - quiet respiration. Inspection of the nares - Right - quiet respiration. Mouth and Throat Lips - Upper Lip - no fissures observed, no pallor noted. Lower Lip - no fissures observed, no pallor noted. Nasopharynx - no discharge present. Oral Cavity/Oropharynx - Tongue - no dryness observed. Oral Mucosa - no cyanosis observed. Hypopharynx - no evidence of airway distress observed. Note:  Hard of hearing  Chest and Lung Exam Inspection Movements - Normal and Symmetrical. Accessory muscles - No use of accessory muscles in breathing. Palpation Palpation of the chest reveals - Non-tender. Auscultation Breath sounds - Normal and Clear.  Cardiovascular Auscultation Rhythm - Regular. Murmurs & Other Heart Sounds - Auscultation of the heart reveals - No Murmurs and No Systolic Clicks.  Abdomen Inspection Inspection of the abdomen reveals - No Visible peristalsis and No Abnormal pulsations. Umbilicus - No Bleeding, No Urine drainage. Palpation/Percussion Palpation and Percussion of the abdomen reveal - Soft, Non Tender, No Rebound tenderness, No Rigidity (guarding) and No Cutaneous hyperesthesia. Note:  Abdomen soft.  Nontender.  Not distended.  No umbilical or incisional hernias.  No guarding.  Male Genitourinary Sexual Maturity Tanner 5 - Adult hair pattern and Adult penile size and shape. Note:  No inguinal hernias. Normal external genitalia. Epididymi, testes, and spermatic cords normal without any masses.  Rectal Note:  Examination don't patient in decubitus.  Perianal region clear without any fistulas or abscess or any external hemorrhoids. Coccyx without any pain or pilonidal disease. Normal sphincter  tone.  He does have some mild narrowing versus spasming of his anal sphincter but eventually relaxes. I can palpate a frond-like mass in the rectum about 2-3 cm above the sphincters. It is not hard or rocky. It is friable and bleeds easily Seems to be on a more narrow stalk. It is mobile. It seems mainly focused on the left lateral to left anterior part of the rectal wall. I can feel above it. The base of the stalk feels about 3 cm wide.  Peripheral Vascular Upper Extremity Inspection - Left - No Cyanotic nailbeds - Left, Not Ischemic. Inspection - Right - No Cyanotic nailbeds - Right, Not Ischemic.  Neurologic Neurologic evaluation reveals  - normal attention span and ability to concentrate, able to name objects and repeat phrases. Appropriate fund of knowledge , normal sensation and normal coordination. Mental Status Affect - not angry, not paranoid. Cranial Nerves - Normal Bilaterally. Gait - Normal.  Neuropsychiatric Mental status exam performed with findings of - able to articulate well with normal speech/language, rate, volume and coherence, thought content normal with ability to perform basic computations and apply abstract reasoning and no evidence of hallucinations, delusions, obsessions or homicidal/suicidal ideation. Note:  Interrupted often with other questions. Somewhat tangential. Not quite to the point of mania or word salad.  Musculoskeletal Global Assessment Spine, Ribs and Pelvis - no instability, subluxation or laxity. Right Upper Extremity - no instability, subluxation or laxity.  Lymphatic Head & Neck  General Head & Neck Lymphatics: Bilateral - Description - No Localized lymphadenopathy. Axillary  General Axillary  Region: Bilateral - Description - No Localized lymphadenopathy. Femoral & Inguinal  Generalized Femoral & Inguinal Lymphatics: Left - Description - No Localized lymphadenopathy. Right - Description - No Localized lymphadenopathy.    Assessment &  Plan Adin Hector MD; 08/20/2020 12:51 PM)  ADENOMATOUS RECTAL POLYP (D12.8) Impression: Bleeding left lateral distal rectal polyp. 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 There was concerned perhaps for small focus of malignancy possible but seems soft and friable. I think because it is bleeding and causing issues I would start with transanal excision. TEM vs robotic TAMIS for a full-thickness partial proctectomy to margins. Hopefully just an overnight stay. Had to redirect them a few times since they wanted to talk about his last hospitalization and need for other medications. However I did explain things to them and I believe they came to a decent understanding for informed consent. They wish to proceed as soon as possible, especially in light of having another episode of rectal bleeding going to the hospital.  He seems to have decent performance status but I would like to double check medical clearance by his primary care physician. Dr. Dennard Schaumann & his group have been involved sowed like to double check that he is cleared from a medical standpoint. I'm pretty certain but just want to be safe.  They appear ready to consider surgery. We will try and get this done in a timely fashion.   PREOP COLON - ENCOUNTER FOR PREOPERATIVE EXAMINATION FOR GENERAL SURGICAL PROCEDURE (Z01.818)  Current Plans You are being scheduled for surgery - Our schedulers will call you.   You should hear from our office's scheduling department within 5 working days about the location, date, and time of surgery.  We try to make accommodations for patient's preferences in scheduling surgery, but sometimes the OR schedule or the surgeon's schedule prevents [place] from making those accommodations.  If you have not heard from our office [phone]) in 5 working days, call the office and ask for your surgeon's nurse.  If you have other questions about your diagnosis, plan, or surgery, call the office and ask for your  surgeon's nurse.  Written instructions provided Pt Education - [facility] Colon Bowel Prep 2018 ERAS/Miralax/Antibiotics Pt Education - [facility] TEM Education (Sekou Zuckerman): discussed with patient and provided information. The anatomy & physiology of the digestive tract was discussed.  The pathophysiology of the rectal pathology was discussed.  Natural history risks without surgery was discussed.   I feel the risks of no intervention will lead to serious problems that outweigh the operative risks; therefore, I recommended surgery.   Laparoscopic & open abdominal techniques were discussed.  I recommended we start with a partial proctectomy by transanal endoscopic microsurgery (TEM) for excisional biopsy to remove the pathology and hopefully cure and/or control the pathology.  This technique can offer less operative risk and faster post-operative recovery.  Possible need for immediate or later abdominal surgery for further treatment was discussed.  Risks such as bleeding, abscess, reoperation, ostomy, heart attack, death, and other risks were discussed.   I noted a good likelihood this will help address the problem.  Goals of post-operative recovery were discussed as well.  We will work to minimize complications.  An educational handout was given as well.  Questions were answered.  The patient expresses understanding & wishes to proceed with surgery.  Pt Education - [facility] Colectomy post-op instructions: discussed with patient and provided information. Started metroNIDAZOLE 500 MG Oral Tablet, 2 (two) Tablet three times daily, #6, 1 day  starting 08/20/2020, No Refill. Local Order: Pharmacist Notes: Pharmacy Instructions: Take 2 tablets at 2pm, 3pm, and 10pm the day prior to your colon operation. Started Neomycin Sulfate 500 MG Oral Tablet, 2 (two) Tablet SEE NOTE, #6, 08/20/2020, No Refill. Local Order: Pharmacist Notes: TAKE TWO TABLETS AT 2 PM, 3 PM, AND 10 PM THE DAY PRIOR TO SURGERY  Adin Hector, MD, FACS,  [facility] Esophageal, Gastrointestinal & Colorectal Surgery Robotic and Minimally Invasive Surgery  [facility] [address], [place], [place] [address] [phone] Fax [phone] Main/Paging  CONTACT INFORMATION: Weekday (9AM-5PM) concerns: Call [facility] main office at [phone] Weeknight (5PM-9AM) or Weekend/Holiday concerns: Check [email or web address] for General Surgery [facility] coverage (Please, do not use SecureChat as it is not reliable communication to operating surgeons for immediate patient care)

## 2020-08-27 ENCOUNTER — Encounter: Payer: Self-pay | Admitting: Family Medicine

## 2020-09-01 DIAGNOSIS — H401133 Primary open-angle glaucoma, bilateral, severe stage: Secondary | ICD-10-CM | POA: Diagnosis not present

## 2020-09-10 ENCOUNTER — Ambulatory Visit: Payer: Medicare Other | Admitting: Gastroenterology

## 2020-09-10 ENCOUNTER — Telehealth: Payer: Self-pay

## 2020-09-10 ENCOUNTER — Encounter: Payer: Self-pay | Admitting: Gastroenterology

## 2020-09-10 VITALS — BP 160/86 | HR 88 | Ht 69.0 in | Wt 161.1 lb

## 2020-09-10 DIAGNOSIS — K6289 Other specified diseases of anus and rectum: Secondary | ICD-10-CM | POA: Diagnosis not present

## 2020-09-10 NOTE — Patient Instructions (Addendum)
It was my pleasure to provide care to you today. Based on our discussion, I am providing you with my recommendations below:  RECOMMENDATION(S):    All of your first degree relatives (daughter, son, brothers, and sisters) should start colon cancer screening at age 79 and have close follow-up with regular colonoscopies.  You may start taking your iron supplements every other day instead of every day.     FOLLOW UP:  I would like for you to follow up with General Surgery as planned.  BMI:  If you are age 71 or older, your body mass index should be between 23-30. Your Body mass index is 23.79 kg/m. If this is out of the aforementioned range listed, please consider follow up with your Primary Care Provider.  MY CHART:  The [place] [facility] providers would like to encourage you to use [facility] to communicate with providers for non-urgent requests or questions.  Due to long hold times on the telephone, sending your provider a message by [facility] may be a faster and more efficient way to get a response.  Please allow 48 business hours for a response.  Please remember that this is for non-urgent requests.   Thank you for trusting me with your gastrointestinal care!    Thornton Park, MD, MPH

## 2020-09-10 NOTE — Progress Notes (Addendum)
Referring Provider: Susy Frizzle, MD Primary Care Physician:  Ryan Frizzle, MD  Chief complaint:  Rectal mass   IMPRESSION:  Rectal mass worrisome for malignancy GI blood loss anemia related to rectal mass   PLAN: Hemoglobin, ferritin, iron Surgery scheduled with Dr. Johney Maine 09/25/20   I spent over 20 minutes, including independent review of results, communicating results with the patient directly, face-to-face time with the patient, coordinating care, ordering studies and medications as appropriate, and documentation.     HPI: Ryan Mosley is a 80 y.o. male who returns in follow-up. I met him during his hospitalization in May for GI bleeding. Interval history is obtained through the patient, his wife who accompanies him to this appointment, and review of his electronic health record. He was found to have a rectal mass. 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 MRI not performed because he received IV iron while hospitalized. CTA abd/pelvis and chest CT showed a 1.4 cm LLL lung nodule and emphysema. EUS performing physicians did not feel the findings would change management. Surgery offered transanal resection through robotic TAMIS or TEM. He initially declined by has since reconsidered. He has surgery with Dr. Johney Maine the first week of August.  He denies any ongoing bleeding. He does not tolerate oral iron supplements due to headaches. Some insomnia. No new GI complaints.   His wife accompanies him to this appointment. His daughter was in the waiting room, but, I updated her prior to discharge.   Labs 08/16/20: WBC 8.7, hgb 8.3, MCV 89.9, RDW 17.1, platelets 180, AST 16, ALT 14, alk phos 48, TB 0.4, alb 3.0    Past Medical History:  Diagnosis Date   Acute blood loss anemia    on meds   Blood transfusion without reported diagnosis 2022   Cataract    bilateral sx   Diabetes mellitus without complication ([place])     not on meds at this time   GERD (gastroesophageal reflux disease)    hx of   GI bleed    Hyperlipidemia    on meds   Hypertension    on meds   Meningitis 1960/1970   Pneumonia 1960/1970    Past Surgical History:  Procedure Laterality Date   BIOPSY  07/08/2020   Procedure: BIOPSY;  Surgeon: Thornton Park, MD;  Location: [place];  Service: Gastroenterology;;   COLONOSCOPY WITH PROPOFOL N/A 07/08/2020   Procedure: COLONOSCOPY WITH PROPOFOL;  Surgeon: Thornton Park, MD;  Location: [facility];  Service: Gastroenterology;  Laterality: N/A;   DENTAL SURGERY     all teeth removed    Current Outpatient Medications  Medication Sig Dispense Refill   acetaminophen (TYLENOL) 500 MG tablet Take 500 mg by mouth every morning.     amLODipine (NORVASC) 5 MG tablet TAKE 1 TABLET BY MOUTH EVERY DAY FOR BLOOD PRESSURE 90 tablet 2   ferrous sulfate 325 (65 FE) MG EC tablet Take 1 tablet (325 mg total) by mouth 2 (two) times daily. 60 tablet 3   latanoprost (XALATAN) 0.005 % ophthalmic solution Place 1 drop into both eyes at bedtime.  3   Pseudoephedrine-Ibuprofen (ADVIL COLD/SINUS PO) Take 1 tablet by mouth daily as needed (sinus relief).     rosuvastatin (CRESTOR) 5 MG tablet TAKE 1 TABLET BY MOUTH ONCE A WEEK FOR CHOLESTEROL 12 tablet 2   No current facility-administered medications for this visit.    Allergies as of 09/10/2020   (No Known Allergies)  Family History  Problem Relation Age of Onset   Seizures Daughter    Diabetes Son    Colon polyps Neg Hx    Colon cancer Neg Hx    Esophageal cancer Neg Hx    Rectal cancer Neg Hx    Stomach cancer Neg Hx       Physical Exam: General:   Alert,  well-nourished, pleasant and cooperative in NAD Head:  Normocephalic and atraumatic. Eyes:  Sclera clear, no icterus.   Conjunctiva pink. Ears:  Normal auditory acuity. Nose:  No deformity, discharge,  or lesions. Mouth:  No deformity or lesions.   Neck:  Supple; no masses  or thyromegaly. Lungs:  Clear throughout to auscultation.   No wheezes. Heart:  Regular rate and rhythm; no murmurs. Abdomen:  Soft,nontender, nondistended, normal bowel sounds, no rebound or guarding. No hepatosplenomegaly.   Rectal:  Deferred  Msk:  Symmetrical. No boney deformities LAD: No inguinal or umbilical LAD Extremities:  No clubbing or edema. Neurologic:  Alert and  oriented x4;  grossly nonfocal Skin:  Intact without significant lesions or rashes. Psych:  Alert and cooperative. Normal mood and affect.     [name] L. Tarri Glenn, MD, MPH 09/10/2020, 8:44 AM

## 2020-09-10 NOTE — Progress Notes (Addendum)
COVID Vaccine Completed: Yes x3 Date COVID Vaccine completed:  05-25-19 & 06-22-19 Has received booster: 01-08-20 COVID vaccine manufacturer: [place]  Date of COVID positive in last 90 days:  N/A  PCP - Jenna Luo, MD Cardiologist - N/A  Chest x-ray - 08-15-20 Epic EKG - 08-15-20 Epic Stress Test - 15+ years ago ECHO - N/A Cardiac Cath - N/A Pacemaker/ICD device last checked: Spinal Cord Stimulator:  Sleep Study - N/A CPAP -   Fasting Blood Sugar - 90 to 119 Checks Blood Sugar - Every other day  Blood Thinner Instructions: N/A Aspirin Instructions: Last Dose:  Activity level:  Can go up a flight of stairs and perform activities of daily living without stopping and without symptoms of chest pain or shortness of breath.      Anesthesia review:  N/A  Patient denies shortness of breath, fever, cough and chest pain at PAT appointment   Patient verbalized understanding of instructions that were given to them at the PAT appointment. Patient was also instructed that they will need to review over the PAT instructions again at home before surgery.

## 2020-09-10 NOTE — Patient Instructions (Addendum)
DUE TO COVID-19 ONLY ONE VISITOR IS ALLOWED TO COME WITH YOU AND STAY IN THE WAITING ROOM ONLY DURING PRE OP AND PROCEDURE.   **NO VISITORS ARE ALLOWED IN THE SHORT STAY AREA OR RECOVERY ROOM!!**  IF YOU WILL BE ADMITTED INTO THE HOSPITAL YOU ARE ALLOWED ONLY TWO SUPPORT PEOPLE DURING VISITATION HOURS ONLY (10AM -8PM)   The support person(s) may change daily. The support person(s) must pass our screening, gel in and out, and wear a mask at all times, including in the patient's room. Patients must also wear a mask when staff or their support person are in the room.  No visitors under the age of 43. Any visitor under the age of 47 must be accompanied by an adult.    COVID SWAB TESTING MUST BE COMPLETED ON:  Tuesday, 09-23-20 between [address] and [address], [place], [place], [place] [address]    You are not required to quarantine, however you are required to wear a well-fitted mask when you are out and around people not in your household.  Hand Hygiene often Do NOT share personal items Notify your provider if you are in close contact with someone who has COVID or you develop fever 100.4 or greater, new onset of sneezing, cough, sore throat, shortness of breath or body aches.        Your procedure is scheduled on: Thursday, 09-25-20   Report to [facility] Main  Entrance   Report to admitting at 9:00 AM   Call this number if you have problems the morning of surgery [phone]    Follow prep from surgeon's office   Do not eat food :After Midnight.   May have liquids until 8:00 AM day of surgery  CLEAR LIQUID DIET  Foods Allowed                                                                     Foods Excluded  Water, Black Coffee and tea, regular and decaf             liquids that you cannot  Plain Jell-O in any flavor  (No red)                                   see through such as: Fruit ices (not with fruit pulp)                                      milk, soups,  orange juice              Iced Popsicles (No red)                                      All solid food                                   Apple juices Sports drinks like Gatorade (No red) Lightly seasoned clear  broth or consume(fat free) Sugar, honey syrup  Sample Menu Breakfast                                Lunch                                     Supper Cranberry juice                    Beef broth                            Chicken broth Jell-O                                     Grape juice                           Apple juice Coffee or tea                        Jell-O                                      Popsicle                                                Coffee or tea                        Coffee or tea      Drink 2 G2 drinks the night before surgery, complete by 10 PM  Complete one G2 drink the morning of surgery 3 hours prior to scheduled surgery, complete by 8 AM     The day of surgery:  Drink ONE (1) G2 by am the morning of surgery. Drink in one sitting. Do not sip.  This drink was given to you during your hospital  pre-op appointment visit. Nothing else to drink after completing the G2.          If you have questions, please contact your surgeon's office.     Oral Hygiene is also important to reduce your risk of infection.                                    Remember - BRUSH YOUR TEETH THE MORNING OF SURGERY WITH YOUR REGULAR TOOTHPASTE   Do NOT smoke after Midnight   Take these medicines the morning of surgery with A SIP OF WATER: Amlodipine, Rosuvastatin  DO NOT TAKE ANY ORAL DIABETIC MEDICATIONS DAY OF YOUR SURGERY                              You may not have any metal on your body including jewelry and body piercing             Do not wear lotions, powders,  cologne, or deodorant              Men may shave face and neck.   Do not bring valuables to the hospital. [place].   Contacts, dentures or bridgework may  not be worn into surgery.   Bring small overnight bag day of surgery.   Please read over the following fact sheets you were given: IF YOU HAVE QUESTIONS ABOUT YOUR PRE OP INSTRUCTIONS PLEASE CALL [facility] - Preparing for Surgery Before surgery, you can play an important role.  Because skin is not sterile, your skin needs to be as free of germs as possible.  You can reduce the number of germs on your skin by washing with CHG (chlorahexidine gluconate) soap before surgery.  CHG is an antiseptic cleaner which kills germs and bonds with the skin to continue killing germs even after washing. Please DO NOT use if you have an allergy to CHG or antibacterial soaps.  If your skin becomes reddened/irritated stop using the CHG and inform your nurse when you arrive at Short Stay. Do not shave (including legs and underarms) for at least 48 hours prior to the first CHG shower.  You may shave your face/neck.  Please follow these instructions carefully:  1.  Shower with CHG Soap the night before surgery and the  morning of surgery.  2.  If you choose to wash your hair, wash your hair first as usual with your normal  shampoo.  3.  After you shampoo, rinse your hair and body thoroughly to remove the shampoo.                             4.  Use CHG as you would any other liquid soap.  You can apply chg directly to the skin and wash.  Gently with a scrungie or clean washcloth.  5.  Apply the CHG Soap to your body ONLY FROM THE NECK DOWN.   Do   not use on face/ open                           Wound or open sores. Avoid contact with eyes, ears mouth and   genitals (private parts).                       Wash face,  Genitals (private parts) with your normal soap.             6.  Wash thoroughly, paying special attention to the area where your    surgery  will be performed.  7.  Thoroughly rinse your body with warm water from the neck down.  8.  DO NOT shower/wash with your normal soap after  using and rinsing off the CHG Soap.                9.  Pat yourself dry with a clean towel.            10.  Wear clean pajamas.            11.  Place clean sheets on your bed the night of your first shower and do not  sleep with pets. Day of Surgery : Do not apply any lotions/deodorants the morning of surgery.  Please wear clean clothes to the hospital/surgery center.  FAILURE TO FOLLOW  THESE INSTRUCTIONS MAY RESULT IN THE CANCELLATION OF YOUR SURGERY  PATIENT SIGNATURE_________________________________  NURSE SIGNATURE__________________________________  ________________________________________________________________________   Ryan Mosley  An incentive spirometer is a tool that can help keep your lungs clear and active. This tool measures how well you are filling your lungs with each breath. Taking long deep breaths may help reverse or decrease the chance of developing breathing (pulmonary) problems (especially infection) following: A long period of time when you are unable to move or be active. BEFORE THE PROCEDURE  If the spirometer includes an indicator to show your best effort, your nurse or respiratory therapist will set it to a desired goal. If possible, sit up straight or lean slightly forward. Try not to slouch. Hold the incentive spirometer in an upright position. INSTRUCTIONS FOR USE  Sit on the edge of your bed if possible, or sit up as far as you can in bed or on a chair. Hold the incentive spirometer in an upright position. Breathe out normally. Place the mouthpiece in your mouth and seal your lips tightly around it. Breathe in slowly and as deeply as possible, raising the piston or the ball toward the top of the column. Hold your breath for 3-5 seconds or for as long as possible. Allow the piston or ball to fall to the bottom of the column. Remove the mouthpiece from your mouth and breathe out normally. Rest for a few seconds and repeat Steps 1 through 7 at least  10 times every 1-2 hours when you are awake. Take your time and take a few normal breaths between deep breaths. The spirometer may include an indicator to show your best effort. Use the indicator as a goal to work toward during each repetition. After each set of 10 deep breaths, practice coughing to be sure your lungs are clear. If you have an incision (the cut made at the time of surgery), support your incision when coughing by placing a pillow or rolled up towels firmly against it. Once you are able to get out of bed, walk around indoors and cough well. You may stop using the incentive spirometer when instructed by your caregiver.  RISKS AND COMPLICATIONS Take your time so you do not get dizzy or light-headed. If you are in pain, you may need to take or ask for pain medication before doing incentive spirometry. It is harder to take a deep breath if you are having pain. AFTER USE Rest and breathe slowly and easily. It can be helpful to keep track of a log of your progress. Your caregiver can provide you with a simple table to help with this. If you are using the spirometer at home, follow these instructions: [place] IF:  You are having difficultly using the spirometer. You have trouble using the spirometer as often as instructed. Your pain medication is not giving enough relief while using the spirometer. You develop fever of 100.5 F (38.1 C) or higher. SEEK IMMEDIATE MEDICAL CARE IF:  You cough up bloody sputum that had not been present before. You develop fever of 102 F (38.9 C) or greater. You develop worsening pain at or near the incision site. MAKE SURE YOU:  Understand these instructions. Will watch your condition. Will get help right away if you are not doing well or get worse. Document Released: 06/21/2006 Document Revised: 05/03/2011 Document Reviewed: 08/22/2006 Exit[employer] Patient Information 2014 ExitCare,  [place].   ________________________________________________________________________  WHAT IS A BLOOD TRANSFUSION? Blood Transfusion Information  A transfusion is the replacement  of blood or some of its parts. Blood is made up of multiple cells which provide different functions. Red blood cells carry oxygen and are used for blood loss replacement. White blood cells fight against infection. Platelets control bleeding. Plasma helps clot blood. Other blood products are available for specialized needs, such as hemophilia or other clotting disorders. BEFORE THE TRANSFUSION  Who gives blood for transfusions?  Healthy volunteers who are fully evaluated to make sure their blood is safe. This is blood bank blood. Transfusion therapy is the safest it has ever been in the practice of medicine. Before blood is taken from a donor, a complete history is taken to make sure that person has no history of diseases nor engages in risky social behavior (examples are intravenous drug use or sexual activity with multiple partners). The donor's travel history is screened to minimize risk of transmitting infections, such as malaria. The donated blood is tested for signs of infectious diseases, such as HIV and hepatitis. The blood is then tested to be sure it is compatible with you in order to minimize the chance of a transfusion reaction. If you or a relative donates blood, this is often done in anticipation of surgery and is not appropriate for emergency situations. It takes many days to process the donated blood. RISKS AND COMPLICATIONS Although transfusion therapy is very safe and saves many lives, the main dangers of transfusion include:  Getting an infectious disease. Developing a transfusion reaction. This is an allergic reaction to something in the blood you were given. Every precaution is taken to prevent this. The decision to have a blood transfusion has been considered carefully by your caregiver before blood is  given. Blood is not given unless the benefits outweigh the risks. AFTER THE TRANSFUSION Right after receiving a blood transfusion, you will usually feel much better and more energetic. This is especially true if your red blood cells have gotten low (anemic). The transfusion raises the level of the red blood cells which carry oxygen, and this usually causes an energy increase. The nurse administering the transfusion will monitor you carefully for complications. HOME CARE INSTRUCTIONS  No special instructions are needed after a transfusion. You may find your energy is better. Speak with your caregiver about any limitations on activity for underlying diseases you may have. SEEK MEDICAL CARE IF:  Your condition is not improving after your transfusion. You develop redness or irritation at the intravenous (IV) site. SEEK IMMEDIATE MEDICAL CARE IF:  Any of the following symptoms occur over the next 12 hours: Shaking chills. You have a temperature by mouth above 102 F (38.9 C), not controlled by medicine. Chest, back, or muscle pain. People around you feel you are not acting correctly or are confused. Shortness of breath or difficulty breathing. Dizziness and fainting. You get a rash or develop hives. You have a decrease in urine output. Your urine turns a dark color or changes to pink, red, or brown. Any of the following symptoms occur over the next 10 days: You have a temperature by mouth above 102 F (38.9 C), not controlled by medicine. Shortness of breath. Weakness after normal activity. The white part of the eye turns yellow (jaundice). You have a decrease in the amount of urine or are urinating less often. Your urine turns a dark color or changes to pink, red, or brown. Document Released: 02/06/2000 Document Revised: 05/03/2011 Document Reviewed: 09/25/2007 [facility] Patient Information 2014 [place], [place].  _______________________________________________________________________

## 2020-09-11 ENCOUNTER — Telehealth: Payer: Self-pay

## 2020-09-16 ENCOUNTER — Encounter (HOSPITAL_COMMUNITY): Payer: Self-pay

## 2020-09-16 ENCOUNTER — Encounter (HOSPITAL_COMMUNITY)
Admission: RE | Admit: 2020-09-16 | Discharge: 2020-09-16 | Disposition: A | Discharge status: Home / Self Care | Payer: Medicare Other | Source: Ambulatory Visit | Attending: Surgery | Admitting: Surgery

## 2020-09-16 ENCOUNTER — Other Ambulatory Visit: Payer: Self-pay

## 2020-09-16 DIAGNOSIS — Z01812 Encounter for preprocedural laboratory examination: Secondary | ICD-10-CM | POA: Diagnosis not present

## 2020-09-16 HISTORY — DX: Rectal polyp: K62.1

## 2020-09-16 LAB — CBC
HCT: 37.1 % — ABNORMAL LOW (ref 39.0–52.0)
Hemoglobin: 11.1 g/dL — ABNORMAL LOW (ref 13.0–17.0)
MCH: 26.7 pg (ref 26.0–34.0)
MCHC: 29.9 g/dL — ABNORMAL LOW (ref 30.0–36.0)
MCV: 89.2 fL (ref 80.0–100.0)
Platelets: 300 10*3/uL (ref 150–400)
RBC: 4.16 MIL/uL — ABNORMAL LOW (ref 4.22–5.81)
RDW: 15.1 % (ref 11.5–15.5)
WBC: 7 10*3/uL (ref 4.0–10.5)
nRBC: 0 % (ref 0.0–0.2)

## 2020-09-16 LAB — BASIC METABOLIC PANEL
Anion gap: 7 (ref 5–15)
BUN: 10 mg/dL (ref 8–23)
CO2: 27 mmol/L (ref 22–32)
Calcium: 9.2 mg/dL (ref 8.9–10.3)
Chloride: 105 mmol/L (ref 98–111)
Creatinine, Ser: 0.85 mg/dL (ref 0.61–1.24)
GFR, Estimated: >60 mL/min (ref >60)
Glucose, Bld: 113 mg/dL — ABNORMAL HIGH (ref 70–99)
Potassium: 4.1 mmol/L (ref 3.5–5.1)
Sodium: 139 mmol/L (ref 135–145)

## 2020-09-16 LAB — GLUCOSE, CAPILLARY: Glucose-Capillary: 99 mg/dL (ref 70–99)

## 2020-09-23 ENCOUNTER — Other Ambulatory Visit: Payer: Self-pay | Admitting: Surgery

## 2020-09-23 LAB — SARS CORONAVIRUS 2 (TAT 6-24 HRS): SARS Coronavirus 2: NEGATIVE

## 2020-09-24 MED ORDER — SODIUM CHLORIDE 0.9 % IV SOLN
2.0000 g | INTRAVENOUS | Status: AC
  Administered 2020-09-25: 2 g via INTRAVENOUS
  Filled 2020-09-24: qty 2

## 2020-09-24 MED ORDER — BUPIVACAINE LIPOSOME 1.3 % IJ SUSP
20.0000 mL | Freq: Once | INTRAMUSCULAR | Status: DC
  Filled 2020-09-24: qty 20

## 2020-09-25 ENCOUNTER — Inpatient Hospital Stay (HOSPITAL_COMMUNITY): Payer: Medicare Other | Admitting: Certified Registered"

## 2020-09-25 ENCOUNTER — Other Ambulatory Visit: Payer: Self-pay

## 2020-09-25 ENCOUNTER — Inpatient Hospital Stay (HOSPITAL_COMMUNITY)
Admission: RE | Admit: 2020-09-25 | Discharge: 2020-09-26 | DRG: 334 | Disposition: A | Discharge status: Home / Self Care | Payer: Medicare Other | Attending: Surgery | Admitting: Surgery

## 2020-09-25 ENCOUNTER — Encounter (HOSPITAL_COMMUNITY)
Admission: RE | Disposition: A | Discharge status: Home / Self Care | Payer: Self-pay | Source: Home / Self Care | Attending: Surgery

## 2020-09-25 ENCOUNTER — Encounter (HOSPITAL_COMMUNITY): Payer: Self-pay | Admitting: Surgery

## 2020-09-25 DIAGNOSIS — J309 Allergic rhinitis, unspecified: Secondary | ICD-10-CM | POA: Diagnosis not present

## 2020-09-25 DIAGNOSIS — K219 Gastro-esophageal reflux disease without esophagitis: Secondary | ICD-10-CM | POA: Diagnosis not present

## 2020-09-25 DIAGNOSIS — Z8661 Personal history of infections of the central nervous system: Secondary | ICD-10-CM | POA: Diagnosis not present

## 2020-09-25 DIAGNOSIS — E119 Type 2 diabetes mellitus without complications: Secondary | ICD-10-CM

## 2020-09-25 DIAGNOSIS — Z8701 Personal history of pneumonia (recurrent): Secondary | ICD-10-CM | POA: Diagnosis not present

## 2020-09-25 DIAGNOSIS — K6289 Other specified diseases of anus and rectum: Secondary | ICD-10-CM | POA: Diagnosis present

## 2020-09-25 DIAGNOSIS — C20 Malignant neoplasm of rectum: Secondary | ICD-10-CM | POA: Diagnosis not present

## 2020-09-25 DIAGNOSIS — E785 Hyperlipidemia, unspecified: Secondary | ICD-10-CM | POA: Diagnosis present

## 2020-09-25 DIAGNOSIS — Z87891 Personal history of nicotine dependence: Secondary | ICD-10-CM

## 2020-09-25 DIAGNOSIS — K621 Rectal polyp: Secondary | ICD-10-CM | POA: Diagnosis present

## 2020-09-25 DIAGNOSIS — D128 Benign neoplasm of rectum: Secondary | ICD-10-CM | POA: Diagnosis not present

## 2020-09-25 DIAGNOSIS — I10 Essential (primary) hypertension: Secondary | ICD-10-CM | POA: Diagnosis not present

## 2020-09-25 HISTORY — PX: XI ROBOT ASSISTED TRANSANAL RESECTION: SHX6848

## 2020-09-25 LAB — TYPE AND SCREEN
ABO/RH(D): A POS
Antibody Screen: NEGATIVE

## 2020-09-25 LAB — GLUCOSE, CAPILLARY
Glucose-Capillary: 126 mg/dL — ABNORMAL HIGH (ref 70–99)
Glucose-Capillary: 140 mg/dL — ABNORMAL HIGH (ref 70–99)

## 2020-09-25 SURGERY — XI ROBOT ASSISTED TRANSANAL RESECTION
Anesthesia: General | Site: Rectum

## 2020-09-25 MED ORDER — LACTATED RINGERS IR SOLN
Status: DC | PRN
  Administered 2020-09-25: 1000 mL

## 2020-09-25 MED ORDER — MELATONIN 3 MG PO TABS: 3.0000 mg | ORAL_TABLET | Freq: Every evening | ORAL | Status: DC | PRN

## 2020-09-25 MED ORDER — ACETAMINOPHEN 500 MG PO TABS
1000.0000 mg | ORAL_TABLET | Freq: Four times a day (QID) | ORAL | Status: DC
  Administered 2020-09-25 – 2020-09-26 (×4): 1000 mg via ORAL
  Filled 2020-09-25 (×4): qty 2

## 2020-09-25 MED ORDER — ROCURONIUM BROMIDE 10 MG/ML (PF) SYRINGE
PREFILLED_SYRINGE | INTRAVENOUS | Status: DC | PRN
  Administered 2020-09-25: 60 mg via INTRAVENOUS
  Administered 2020-09-25: 20 mg via INTRAVENOUS

## 2020-09-25 MED ORDER — DIPHENHYDRAMINE HCL 12.5 MG/5ML PO ELIX: 12.5000 mg | ORAL_SOLUTION | Freq: Four times a day (QID) | ORAL | Status: DC | PRN

## 2020-09-25 MED ORDER — MIDAZOLAM HCL 2 MG/2ML IJ SOLN: 0.5000 mg | Freq: Once | INTRAMUSCULAR | Status: DC | PRN

## 2020-09-25 MED ORDER — AMLODIPINE BESYLATE 5 MG PO TABS
5.0000 mg | ORAL_TABLET | Freq: Every day | ORAL | Status: DC
  Filled 2020-09-25: qty 1

## 2020-09-25 MED ORDER — OXYCODONE HCL 5 MG PO TABS: 5.0000 mg | ORAL_TABLET | Freq: Four times a day (QID) | ORAL | 0 refills | Status: DC | PRN

## 2020-09-25 MED ORDER — ENSURE SURGERY PO LIQD
237.0000 mL | Freq: Two times a day (BID) | ORAL | Status: DC
  Administered 2020-09-26: 237 mL via ORAL

## 2020-09-25 MED ORDER — LATANOPROST 0.005 % OP SOLN
1.0000 [drp] | Freq: Every day | OPHTHALMIC | Status: DC
  Administered 2020-09-25: 1 [drp] via OPHTHALMIC
  Filled 2020-09-25: qty 2.5

## 2020-09-25 MED ORDER — CHLORHEXIDINE GLUCONATE CLOTH 2 % EX PADS: 6.0000 | MEDICATED_PAD | Freq: Once | CUTANEOUS | Status: DC

## 2020-09-25 MED ORDER — ENSURE PRE-SURGERY PO LIQD
296.0000 mL | Freq: Once | ORAL | Status: DC
  Filled 2020-09-25: qty 296

## 2020-09-25 MED ORDER — FENTANYL CITRATE (PF) 100 MCG/2ML IJ SOLN
INTRAMUSCULAR | Status: AC
  Filled 2020-09-25: qty 2

## 2020-09-25 MED ORDER — DIPHENHYDRAMINE HCL 50 MG/ML IJ SOLN: 12.5000 mg | Freq: Four times a day (QID) | INTRAMUSCULAR | Status: DC | PRN

## 2020-09-25 MED ORDER — ENSURE PRE-SURGERY PO LIQD
592.0000 mL | Freq: Once | ORAL | Status: DC
  Filled 2020-09-25: qty 592

## 2020-09-25 MED ORDER — GABAPENTIN 300 MG PO CAPS
300.0000 mg | ORAL_CAPSULE | ORAL | Status: AC
  Administered 2020-09-25: 300 mg via ORAL
  Filled 2020-09-25: qty 1

## 2020-09-25 MED ORDER — CHLORHEXIDINE GLUCONATE 0.12 % MT SOLN
15.0000 mL | Freq: Once | OROMUCOSAL | Status: AC
Start: 2020-09-25 — End: 2020-09-25
  Administered 2020-09-25: 15 mL via OROMUCOSAL

## 2020-09-25 MED ORDER — DEXAMETHASONE SODIUM PHOSPHATE 10 MG/ML IJ SOLN
INTRAMUSCULAR | Status: DC | PRN
  Administered 2020-09-25: 4 mg via INTRAVENOUS

## 2020-09-25 MED ORDER — ENALAPRILAT 1.25 MG/ML IV SOLN
0.6250 mg | Freq: Four times a day (QID) | INTRAVENOUS | Status: DC | PRN
  Filled 2020-09-25: qty 1

## 2020-09-25 MED ORDER — BISACODYL 5 MG PO TBEC: 20.0000 mg | DELAYED_RELEASE_TABLET | Freq: Once | ORAL | Status: DC

## 2020-09-25 MED ORDER — DIBUCAINE (PERIANAL) 1 % EX OINT
TOPICAL_OINTMENT | CUTANEOUS | Status: AC
  Filled 2020-09-25: qty 28

## 2020-09-25 MED ORDER — FENTANYL CITRATE (PF) 250 MCG/5ML IJ SOLN
INTRAMUSCULAR | Status: AC
  Filled 2020-09-25: qty 5

## 2020-09-25 MED ORDER — TRAMADOL HCL 50 MG PO TABS
50.0000 mg | ORAL_TABLET | Freq: Four times a day (QID) | ORAL | Status: DC | PRN
Start: 2020-09-25 — End: 2020-09-26

## 2020-09-25 MED ORDER — 0.9 % SODIUM CHLORIDE (POUR BTL) OPTIME
TOPICAL | Status: DC | PRN
  Administered 2020-09-25: 1000 mL

## 2020-09-25 MED ORDER — BUPIVACAINE-EPINEPHRINE 0.25% -1:200000 IJ SOLN
INTRAMUSCULAR | Status: DC | PRN
  Administered 2020-09-25: 20 mL

## 2020-09-25 MED ORDER — ZINC OXIDE 40 % EX OINT
TOPICAL_OINTMENT | Freq: Two times a day (BID) | CUTANEOUS | Status: DC
  Filled 2020-09-25: qty 57

## 2020-09-25 MED ORDER — ENOXAPARIN SODIUM 40 MG/0.4ML IJ SOSY
40.0000 mg | PREFILLED_SYRINGE | Freq: Once | INTRAMUSCULAR | Status: AC
  Administered 2020-09-25: 40 mg via SUBCUTANEOUS
  Filled 2020-09-25: qty 0.4

## 2020-09-25 MED ORDER — ACETAMINOPHEN 500 MG PO TABS
1000.0000 mg | ORAL_TABLET | ORAL | Status: DC
  Filled 2020-09-25: qty 2

## 2020-09-25 MED ORDER — ENOXAPARIN SODIUM 40 MG/0.4ML IJ SOSY
40.0000 mg | PREFILLED_SYRINGE | INTRAMUSCULAR | Status: DC
  Administered 2020-09-26: 40 mg via SUBCUTANEOUS
  Filled 2020-09-25: qty 0.4

## 2020-09-25 MED ORDER — OXYCODONE HCL 5 MG/5ML PO SOLN: 5.0000 mg | Freq: Once | ORAL | Status: DC | PRN

## 2020-09-25 MED ORDER — ALVIMOPAN 12 MG PO CAPS
12.0000 mg | ORAL_CAPSULE | Freq: Two times a day (BID) | ORAL | Status: DC
  Administered 2020-09-26: 12 mg via ORAL
  Filled 2020-09-25: qty 1

## 2020-09-25 MED ORDER — PROMETHAZINE HCL 25 MG/ML IJ SOLN: 6.2500 mg | INTRAMUSCULAR | Status: DC | PRN

## 2020-09-25 MED ORDER — PROCHLORPERAZINE EDISYLATE 10 MG/2ML IJ SOLN
5.0000 mg | Freq: Four times a day (QID) | INTRAMUSCULAR | Status: DC | PRN
Start: 2020-09-25 — End: 2020-09-26

## 2020-09-25 MED ORDER — LACTATED RINGERS IV SOLN: INTRAVENOUS | Status: DC

## 2020-09-25 MED ORDER — ONDANSETRON HCL 4 MG PO TABS: 4.0000 mg | ORAL_TABLET | Freq: Four times a day (QID) | ORAL | Status: DC | PRN

## 2020-09-25 MED ORDER — CALCIUM POLYCARBOPHIL 625 MG PO TABS
625.0000 mg | ORAL_TABLET | Freq: Two times a day (BID) | ORAL | Status: DC
  Administered 2020-09-25 – 2020-09-26 (×2): 625 mg via ORAL
  Filled 2020-09-25 (×2): qty 1

## 2020-09-25 MED ORDER — ORAL CARE MOUTH RINSE: 15.0000 mL | Freq: Once | OROMUCOSAL | Status: AC

## 2020-09-25 MED ORDER — LACTATED RINGERS IV SOLN: INTRAVENOUS | Status: AC

## 2020-09-25 MED ORDER — ONDANSETRON HCL 4 MG/2ML IJ SOLN
INTRAMUSCULAR | Status: DC | PRN
Start: 2020-09-25 — End: 2020-09-25
  Administered 2020-09-25: 4 mg via INTRAVENOUS

## 2020-09-25 MED ORDER — NEOMYCIN SULFATE 500 MG PO TABS: 1000.0000 mg | ORAL_TABLET | ORAL | Status: DC

## 2020-09-25 MED ORDER — SODIUM CHLORIDE 0.9 % IV SOLN: Freq: Three times a day (TID) | INTRAVENOUS | Status: DC | PRN

## 2020-09-25 MED ORDER — FENTANYL CITRATE (PF) 100 MCG/2ML IJ SOLN
INTRAMUSCULAR | Status: DC | PRN
  Administered 2020-09-25: 50 ug via INTRAVENOUS

## 2020-09-25 MED ORDER — LIDOCAINE 2% (20 MG/ML) 5 ML SYRINGE
INTRAMUSCULAR | Status: DC | PRN
  Administered 2020-09-25: 20 mg via INTRAVENOUS

## 2020-09-25 MED ORDER — MEPERIDINE HCL 50 MG/ML IJ SOLN: 6.2500 mg | INTRAMUSCULAR | Status: DC | PRN

## 2020-09-25 MED ORDER — ALVIMOPAN 12 MG PO CAPS
12.0000 mg | ORAL_CAPSULE | ORAL | Status: AC
Start: 2020-09-25 — End: 2020-09-25
  Administered 2020-09-25: 12 mg via ORAL
  Filled 2020-09-25: qty 1

## 2020-09-25 MED ORDER — PHENYLEPHRINE HCL-NACL 20-0.9 MG/250ML-% IV SOLN
INTRAVENOUS | Status: DC | PRN
  Administered 2020-09-25: 25 ug/min via INTRAVENOUS

## 2020-09-25 MED ORDER — LIDOCAINE HCL (PF) 2 % IJ SOLN
INTRAMUSCULAR | Status: DC | PRN
  Administered 2020-09-25: 1.5 mg/kg/h via INTRADERMAL

## 2020-09-25 MED ORDER — WITCH HAZEL-GLYCERIN EX PADS
1.0000 "application " | MEDICATED_PAD | CUTANEOUS | Status: DC | PRN
  Filled 2020-09-25: qty 100

## 2020-09-25 MED ORDER — LIP MEDEX EX OINT
1.0000 "application " | TOPICAL_OINTMENT | Freq: Two times a day (BID) | CUTANEOUS | Status: DC
  Administered 2020-09-25 – 2020-09-26 (×2): 1 via TOPICAL
  Filled 2020-09-25: qty 7

## 2020-09-25 MED ORDER — FENTANYL CITRATE (PF) 100 MCG/2ML IJ SOLN: 25.0000 ug | INTRAMUSCULAR | Status: DC | PRN

## 2020-09-25 MED ORDER — BUPIVACAINE-EPINEPHRINE (PF) 0.25% -1:200000 IJ SOLN
INTRAMUSCULAR | Status: AC
  Filled 2020-09-25: qty 30

## 2020-09-25 MED ORDER — PROPOFOL 10 MG/ML IV BOLUS
INTRAVENOUS | Status: AC
  Filled 2020-09-25: qty 20

## 2020-09-25 MED ORDER — BUPIVACAINE LIPOSOME 1.3 % IJ SUSP
INTRAMUSCULAR | Status: DC | PRN
  Administered 2020-09-25: 20 mL

## 2020-09-25 MED ORDER — HYDROMORPHONE HCL 1 MG/ML IJ SOLN
0.5000 mg | INTRAMUSCULAR | Status: DC | PRN
Start: 2020-09-25 — End: 2020-09-26

## 2020-09-25 MED ORDER — OXYCODONE HCL 5 MG PO TABS: 5.0000 mg | ORAL_TABLET | Freq: Once | ORAL | Status: DC | PRN

## 2020-09-25 MED ORDER — PHENYLEPHRINE HCL (PRESSORS) 10 MG/ML IV SOLN
INTRAVENOUS | Status: AC
  Filled 2020-09-25: qty 1

## 2020-09-25 MED ORDER — SIMETHICONE 80 MG PO CHEW: 40.0000 mg | CHEWABLE_TABLET | Freq: Four times a day (QID) | ORAL | Status: DC | PRN

## 2020-09-25 MED ORDER — MIDAZOLAM HCL 2 MG/2ML IJ SOLN
INTRAMUSCULAR | Status: AC
  Filled 2020-09-25: qty 2

## 2020-09-25 MED ORDER — POLYETHYLENE GLYCOL 3350 17 GM/SCOOP PO POWD: 1.0000 | Freq: Once | ORAL | Status: DC

## 2020-09-25 MED ORDER — METRONIDAZOLE 500 MG PO TABS: 1000.0000 mg | ORAL_TABLET | ORAL | Status: DC

## 2020-09-25 MED ORDER — ONDANSETRON HCL 4 MG/2ML IJ SOLN: 4.0000 mg | Freq: Four times a day (QID) | INTRAMUSCULAR | Status: DC | PRN

## 2020-09-25 MED ORDER — SODIUM CHLORIDE 0.9 % IV SOLN
2.0000 g | Freq: Two times a day (BID) | INTRAVENOUS | Status: AC
  Administered 2020-09-26: 2 g via INTRAVENOUS
  Filled 2020-09-25: qty 2

## 2020-09-25 MED ORDER — METOPROLOL TARTRATE 5 MG/5ML IV SOLN: 5.0000 mg | Freq: Four times a day (QID) | INTRAVENOUS | Status: DC | PRN

## 2020-09-25 MED ORDER — PROPOFOL 10 MG/ML IV BOLUS
INTRAVENOUS | Status: DC | PRN
  Administered 2020-09-25: 100 mg via INTRAVENOUS
  Administered 2020-09-25: 30 mg via INTRAVENOUS

## 2020-09-25 MED ORDER — MAGIC MOUTHWASH
15.0000 mL | Freq: Four times a day (QID) | ORAL | Status: DC | PRN
  Filled 2020-09-25: qty 15

## 2020-09-25 MED ORDER — PROCHLORPERAZINE MALEATE 10 MG PO TABS
10.0000 mg | ORAL_TABLET | Freq: Four times a day (QID) | ORAL | Status: DC | PRN
  Filled 2020-09-25: qty 1

## 2020-09-25 MED ORDER — SUGAMMADEX SODIUM 200 MG/2ML IV SOLN
INTRAVENOUS | Status: DC | PRN
  Administered 2020-09-25: 200 mg via INTRAVENOUS

## 2020-09-25 MED ORDER — FENTANYL CITRATE (PF) 250 MCG/5ML IJ SOLN
INTRAMUSCULAR | Status: DC | PRN
  Administered 2020-09-25: 50 ug via INTRAVENOUS
  Administered 2020-09-25: 150 ug via INTRAVENOUS
  Administered 2020-09-25 (×2): 50 ug via INTRAVENOUS

## 2020-09-25 MED ORDER — ALUM & MAG HYDROXIDE-SIMETH 200-200-20 MG/5ML PO SUSP: 30.0000 mL | Freq: Four times a day (QID) | ORAL | Status: DC | PRN

## 2020-09-25 SURGICAL SUPPLY — 53 items
COVER SURGICAL LIGHT HANDLE (MISCELLANEOUS) ×2 IMPLANT
COVER TIP SHEARS 8 DVNC (MISCELLANEOUS) ×1 IMPLANT
COVER TIP SHEARS 8MM DA VINCI (MISCELLANEOUS) ×1
DRAPE ARM DVNC X/XI (DISPOSABLE) ×4 IMPLANT
DRAPE C-ARM 42X120 X-RAY (DRAPES) IMPLANT
DRAPE COLUMN DVNC XI (DISPOSABLE) ×1 IMPLANT
DRAPE DA VINCI XI ARM (DISPOSABLE) ×4
DRAPE DA VINCI XI COLUMN (DISPOSABLE) ×1
DRAPE ORTHO SPLIT 77X108 STRL (DRAPES)
DRAPE SURG IRRIG POUCH 19X23 (DRAPES) ×2 IMPLANT
DRAPE SURG ORHT 6 SPLT 77X108 (DRAPES) IMPLANT
DRSG PAD ABDOMINAL 8X10 ST (GAUZE/BANDAGES/DRESSINGS) ×2 IMPLANT
ELECT PENCIL ROCKER SW 15FT (MISCELLANEOUS) ×2 IMPLANT
ELECT REM PT RETURN 15FT ADLT (MISCELLANEOUS) ×2 IMPLANT
GAUZE SPONGE 4X4 12PLY STRL (GAUZE/BANDAGES/DRESSINGS) ×2 IMPLANT
GLOVE SURG LTX SZ8 (GLOVE) ×2 IMPLANT
GLOVE SURG UNDER LTX SZ8 (GLOVE) ×2 IMPLANT
GOWN STRL REUS W/TWL XL LVL3 (GOWN DISPOSABLE) ×4 IMPLANT
IRRIG SUCT STRYKERFLOW 2 WTIP (MISCELLANEOUS) ×2
IRRIGATION SUCT STRKRFLW 2 WTP (MISCELLANEOUS) ×1 IMPLANT
KIT BASIN OR (CUSTOM PROCEDURE TRAY) ×2 IMPLANT
KIT TURNOVER KIT A (KITS) ×2 IMPLANT
LEGGING LITHOTOMY PAIR STRL (DRAPES) IMPLANT
PAD POSITIONING PINK XL (MISCELLANEOUS) IMPLANT
PANTS MESH DISP LRG (UNDERPADS AND DIAPERS) ×1 IMPLANT
PANTS MESH DISPOSABLE L (UNDERPADS AND DIAPERS) ×1
PLATFORM TRANSANAL ACCESS 4X5 (MISCELLANEOUS) ×1 IMPLANT
PLATFORM TRANSANAL ACCESS 4X5. (MISCELLANEOUS) ×2
SCISSORS LAP 5X35 DISP (ENDOMECHANICALS) IMPLANT
SEAL CANN UNIV 5-8 DVNC XI (MISCELLANEOUS) ×3 IMPLANT
SEAL XI 5MM-8MM UNIVERSAL (MISCELLANEOUS) ×3
SEALER VESSEL DA VINCI XI (MISCELLANEOUS)
SEALER VESSEL EXT DVNC XI (MISCELLANEOUS) IMPLANT
SET TRI-LUMEN FLTR TB AIRSEAL (TUBING) ×2 IMPLANT
SHEARS HARMONIC HDI 36CM (ELECTROSURGICAL) ×2 IMPLANT
SOLUTION ELECTROLUBE (MISCELLANEOUS) ×2 IMPLANT
STOPCOCK 4 WAY LG BORE MALE ST (IV SETS) ×4 IMPLANT
SURGILUBE 2OZ TUBE FLIPTOP (MISCELLANEOUS) ×2 IMPLANT
SUT PROLENE 0 SH 30 (SUTURE) ×2 IMPLANT
SUT SILK 0 SH 30 (SUTURE) IMPLANT
SUT V-LOC BARB 180 2/0GR6 GS22 (SUTURE) ×2
SUT VLOC BARB 180 ABS3/0GR12 (SUTURE)
SUTURE V-LC BRB 180 2/0GR6GS22 (SUTURE) ×1 IMPLANT
SUTURE VLOC BRB 180 ABS3/0GR12 (SUTURE) IMPLANT
SYR BULB IRRIG 60ML STRL (SYRINGE) IMPLANT
TOWEL OR 17X26 10 PK STRL BLUE (TOWEL DISPOSABLE) ×2 IMPLANT
TOWEL OR NON WOVEN STRL DISP B (DISPOSABLE) ×2 IMPLANT
TRAY FOLEY MTR SLVR 16FR STAT (SET/KITS/TRAYS/PACK) ×2 IMPLANT
TRAY LAPAROSCOPIC (CUSTOM PROCEDURE TRAY) ×2 IMPLANT
TROCAR ADV FIXATION 5X100MM (TROCAR) IMPLANT
TROCAR PORT AIRSEAL 5X120 (TROCAR) ×2 IMPLANT
TUBING CONNECTING 10 (TUBING) ×2 IMPLANT
YANKAUER SUCT BULB TIP NO VENT (SUCTIONS) ×2 IMPLANT

## 2020-09-25 NOTE — Anesthesia Procedure Notes (Signed)
Date/Time: 09/25/2020 2:09 PM Performed by: Cynda Familia, CRNA Oxygen Delivery Method: Simple face mask Placement Confirmation: positive ETCO2 and breath sounds checked- equal and bilateral Dental Injury: Teeth and Oropharynx as per pre-operative assessment

## 2020-09-25 NOTE — Anesthesia Postprocedure Evaluation (Signed)
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  Last Vitals:  Vitals:   09/25/20 0849 09/25/20 1415  BP: (!) 169/81 133/70  Pulse: 75 74  Resp: 15 16  Temp: 36.6 C 36.6 C  SpO2: 98% 100%    Last Pain:  Vitals:   09/25/20 1430  TempSrc:   PainSc: (P) Asleep                 Mandrell Vangilder,E. Samamtha Tiegs

## 2020-09-25 NOTE — Discharge Instructions (Signed)
##############################################  ANORECTAL SURGERY:  POST OPERATIVE INSTRUCTIONS  ######################################################################  EAT Start with a pureed / full liquid diet After 24 hours, gradually transition to a high fiber diet.    CONTROL PAIN Control pain so you can tolerate bowel movements,  walk, sleep, tolerate sneezing/coughing, and go up/down stairs.   HAVE A BOWEL MOVEMENT DAILY Keep your bowels regular to avoid problems.   Taking a fiber supplement every day to keep bowels soft.   Try a laxative to override constipation. Use an antidairrheal to slow down diarrhea.   Call if not better after 2 tries  WALK Walk an hour a day.  Control your pain to do that.   CALL IF YOU HAVE PROBLEMS/CONCERNS Call if you are still struggling despite following these instructions. Call if you have concerns not answered by these instructions  ######################################################################    Take your usually prescribed home medications unless otherwise directed.  DIET: Follow a light bland diet & liquids the first 24 hours after arrival home, such as soup, liquids, starches, etc.  Be sure to drink plenty of fluids.  Quickly advance to a usual solid diet within a few days.  Avoid fast food or heavy meals as your are more likely to get nauseated or have irregular bowels.  A low-fat, high-fiber diet for the rest of your life is ideal.  PAIN CONTROL: Pain is best controlled by a usual combination of many methods TOGETHER: Warm baths/soaks or Ice packs Over the counter pain medication Prescription pain medications Topical creams  Expect swelling and discomfort in the anus/rectal area.  Warm water baths (30-60 minutes up to 6 times a day, especially after bowel meovements) will help. Use ice for the first few days to help decrease swelling and bruising, then switch to heat such as warm towels, sitz baths, warm baths, etc to  help relax tight/sore spots and speed recovery.  Some people prefer to use ice alone, heat alone, alternating between ice & heat.  Experiment to what works for you.   It is helpful to take an over-the-counter pain medication continuously for the first few weeks.  Choose one of the following that works best for you: Naproxen (Aleve, etc)  Two 220mg tabs twice a day Ibuprofen (Advil, etc) Three 200mg tabs four times a day (every meal & bedtime) Acetaminophen (Tylenol, etc) 500-650mg four times a day (every meal & bedtime) A  prescription for pain medication (such as oxycodone, hydrocodone, etc) should be given to you upon discharge.  Take your pain medication as prescribed.  If you are having problems/concerns with the prescription medicine (does not control pain, nausea, vomiting, rash, itching, etc), please call us [phone] to see if we need to switch you to a different pain medicine that will work better for you and/or control your side effect better. If you need a refill on your pain medication, please contact your pharmacy.  They will contact our office to request authorization. Prescriptions will not be filled after 5 pm or on week-ends.  If can take up to 48 hours for it to be filled & ready so avoid waiting until you are down to thel ast pill. A topical cream (Dibucaine) or a prescription for a cream (such as diltiazem 2% gel) may be given to you.  Many people find relief with topical creams.  Some people find it burns too much.  Experiment.  If it helps, use it.  If it burns, don't using it. You also may receive a prescription for   diazepam, and a muscle relaxant to help you to be able to urinate and defecate more easily.  It is safe to take a few doses with the other medications as long as you are not planning to drive or do anything intense.  Hopefully this can minimize the chance of needing a Foley catheter into your bladder   Use a Sitz Bath 4-8 times a day for relief   Sitz Bath A  sitz bath is a warm water bath taken in the sitting position that covers only the hips and buttocks. It may be used for either healing or hygiene purposes. Sitz baths are also used to relieve pain, itching, or muscle spasms. The water may contain medicine. Moist heat will help you heal and relax.  HOME CARE INSTRUCTIONS  Take 3 to 4 sitz baths a day. Fill the bathtub half full with warm water. Sit in the water and open the drain a little. Turn on the warm water to keep the tub half full. Keep the water running constantly. Soak in the water for 15 to 20 minutes. After the sitz bath, pat the affected area dry first.   KEEP YOUR BOWELS REGULAR The goal is one soft bowel movement a day Avoid getting constipated.  Between the surgery and the pain medications, it is common to experience some constipation.  Increasing fluid intake and taking a fiber supplement (such as Metamucil, Citrucel, FiberCon, MiraLax, etc) 2-3 times a day regularly will usually help prevent this problem from occurring.  A mild laxative (prune juice, Milk of Magnesia, MiraLax, etc) should be taken according to package directions if there are no bowel movements after 48 hours. Watch out for diarrhea.  If you have many loose bowel movements, simplify your diet to bland foods & liquids for a few days.  Stop any stool softeners and decrease your fiber supplement.  Switching to mild anti-diarrheal medications (Kayopectate, Pepto Bismol) can help.  Can try an imodium/loperamide dose.  If this worsens or does not improve, please call us.  Wound Care  Remove your bandages with your first bowel movement, usually the day after surgery.  Let the gauze fall off with the first bowel movement or shower.   Wear an absorbent pad or soft cotton balls in your underwear as needed to catch any drainage and help keep the area  Keep the area clean and dry.  Bathe / shower every day.  Keep the area clean by showering / bathing over the incision / wound.    It is okay to soak an open wound to help wash it.  Consider using a squeeze bottle filled with warm water to gently wash the anal area.  Wet wipes or showers / gentle washing after bowel movements is often less traumatic than regular toilet paper. You will often notice bleeding with bowel movements.  This should slow down by the end of the first week of surgery.  Sitting on an ice pack can help. Expect some drainage.  This should slow down by the end of the first week of surgery, but you will have occasional bleeding or drainage up to a few months after surgery.  Wear an absorbent pad or soft cotton gauze in your underwear until the drainage stops.  ACTIVITIES as tolerated:   You may resume regular (light) daily activities beginning the next day--such as daily self-care, walking, climbing stairs--gradually increasing activities as tolerated.  If you can walk 30 minutes without difficulty, it is safe to try more intense activity   such as jogging, treadmill, bicycling, low-impact aerobics, swimming, etc. Save the most intensive and strenuous activity for last such as sit-ups, heavy lifting, contact sports, etc  Refrain from any heavy lifting or straining until you are off narcotics for pain control.   DO NOT PUSH THROUGH PAIN.  Let pain be your guide: If it hurts to do something, don't do it.  Pain is your body warning you to avoid that activity for another week until the pain goes down. You may drive when you are no longer taking prescription pain medication, you can comfortably sit for long periods of time, and you can safely maneuver your car and apply brakes. You may have sexual intercourse when it is comfortable.  FOLLOW UP in our office Please call [facility] at [phone] to set up an appointment to see your surgeon in the office for a follow-up appointment approximately 2-3 weeks after your surgery. Make sure that you call for this appointment the day you arrive home to ensure a convenient appointment  time.  8. IF YOU HAVE DISABILITY OR FAMILY LEAVE FORMS, BRING THEM TO THE OFFICE FOR PROCESSING.  DO NOT GIVE THEM TO YOUR DOCTOR.        WHEN TO CALL US [phone]: Poor pain control Reactions / problems with new medications (rash/itching, nausea, etc)  Fever over 101.5 F (38.5 C) Inability to urinate Nausea and/or vomiting Worsening swelling or bruising Continued bleeding from incision. Increased pain, redness, or drainage from the incision  The clinic staff is available to answer your questions during regular business hours (8:30am-5pm).  Please don't hesitate to call and ask to speak to one of our nurses for clinical concerns.   A surgeon from [facility] is always on call at the hospitals   If you have a medical emergency, go to the nearest emergency room or call 911.    [facility] [address], [address], [place], [place]  [address] ? MAIN: [phone] ? TOLL FREE: [phone] ? FAX [phone] [email or web address]  #####################################################     

## 2020-09-25 NOTE — Transfer of Care (Signed)
Immediate Anesthesia Transfer of Care Note  Patient: Malikah Pile  Procedure(s) Performed: TAMIS PARTIAL PROCTECTOMY OF RECTAL MASS (Rectum)  Patient Location: PACU  Anesthesia Type:General  Level of Consciousness: sedated  Airway & Oxygen Therapy: Patient Spontanous Breathing and Patient connected to face mask oxygen  Post-op Assessment: Report given to RN and Post -op Vital signs reviewed and stable  Post vital signs: Reviewed and stable  Last Vitals:  Vitals Value Taken Time  BP 133/70 09/25/20 1416  Temp    Pulse 72 09/25/20 1417  Resp 15 09/25/20 1417  SpO2 100 % 09/25/20 1417  Vitals shown include unvalidated device data.  Last Pain:  Vitals:   09/25/20 0858  TempSrc:   PainSc: 0-No pain      Patients Stated Pain Goal: 3 ([id number])  Complications: No notable events documented.

## 2020-09-25 NOTE — Anesthesia Preprocedure Evaluation (Addendum)
Anesthesia Evaluation  Patient identified by MRN, date of birth, ID band Patient awake    Reviewed: Allergy & Precautions, NPO status , Patient's Chart, lab work & pertinent test results  History of Anesthesia Complications Negative for: history of anesthetic complications  Airway Mallampati: I  TM Distance: >3 FB Neck ROM: Full    Dental  (+) Edentulous Upper, Edentulous Lower   Pulmonary former smoker,  09/23/2020 SARS coronavirus NEG   breath sounds clear to auscultation       Cardiovascular hypertension, Pt. on medications (-) angina Rhythm:Regular Rate:Normal     Neuro/Psych glaucoma    GI/Hepatic Neg liver ROS, GERD  Controlled,Hematochezia: rectal mass   Endo/Other  diabetes (diet controlled)  Renal/GU negative Renal ROS     Musculoskeletal   Abdominal   Peds  Hematology negative hematology ROS (+)   Anesthesia Other Findings   Reproductive/Obstetrics                            Anesthesia Physical Anesthesia Plan  ASA: 3  Anesthesia Plan: General   Post-op Pain Management:    Induction: Intravenous  PONV Risk Score and Plan: 2 and Ondansetron and Dexamethasone  Airway Management Planned: Oral ETT  Additional Equipment: None  Intra-op Plan:   Post-operative Plan: Extubation in OR  Informed Consent: I have reviewed the patients History and Physical, chart, labs and discussed the procedure including the risks, benefits and alternatives for the proposed anesthesia with the patient or authorized representative who has indicated his/her understanding and acceptance.       Plan Discussed with: CRNA and Surgeon  Anesthesia Plan Comments:        Anesthesia Quick Evaluation

## 2020-09-25 NOTE — Op Note (Signed)
09/25/2020  2:14 PM  PATIENT:  Ryan Mosley  80 y.o. 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  INDICATION: Elderly gentleman with bleeding found to have rectal polypoid mass in distal rectum.  Biopsy showing adenomatous tissues with high-grade dysplasia.  Patient very against any major surgery.  Initially declined any surgical involvement but then after discussion with multiple specialties agreed to transanal excision.  I recommended robotic attempted first.  The anatomy & physiology of the digestive tract was discussed.  The pathophysiology of the rectal pathology was discussed.  Natural history risks without surgery was discussed.   I feel the risks of no intervention will lead to serious problems that outweigh the operative risks; therefore, I recommended surgery.    Laparoscopic & open abdominal techniques were discussed.  I recommended we start with a partial proctectomy by transanal endoscopic  microsurgery (TEM) for excisional biopsy to remove the pathology and hopefully cure and/or control the pathology.  This technique can offer less operative risk and faster post-operative recovery.  Possible need for immediate or later abdominal surgery for further treatment was discussed.   Risks such as bleeding, abscess, reoperation, ostomy, heart attack, death, and other risks were discussed.   I noted a good likelihood this will help address the problem.  Goals of post-operative recovery were discussed as well.  We will work to minimize complications.  An educational handout was given as well.  Questions were answered.  The patient expresses understanding & wishes to proceed with surgery.  OR FINDINGS: Friable bulky mid to distal rectal mass involving left lateral primarily.  Involving a third of the circumference.  Very friable -superficial elements removed to focus on transanal excision.  The resulting mass was 6X6 cm in size  Pin placement on pathology specimen: Proximal margin: Pink (main specimen with red pin.  Final distal margin with pink pins) Distal margin:Green (main specimen with blue pins.  Final distal margin green pins) Left lateral: White Left anterior:  Yellow  The closure rests 0-2cm from the anal verge in the primarily left location.  It is 60% of the circumference.  DESCRIPTION: Informed consent was confirmed.  Patient received general anesthesia without difficulty.  Foley catheter sterilely placed.  Sequential compression devices active during the entire case.  The patient was placed in the prone/jackknife with split leg for legs position, taking extra care to secure and protect the patient appropriately.  The perineum and perianal regions were prepped and draped in a sterile fashion.  Surgical timeout confirmed our plan.  I did a gentle digital rectal examination with  gradual anal dilation to allow placement of the transanal gentle point.  We hooked to air seal for  insufflation.  Robotic ports were placed.  Robot was docked.  Attempt was made to try and get decent visualization.  However the mass was very large and prolapsing upon itself with it being more distal and posterior than expected.  Attempt made to reposition but still cannot get good visualization.  I therefore converted to a transoral anal approach.  I used a Engineer, mining to visualize the mass.  I ended up having to debulk the more friable superficial elements to get better visualization and sent those separately.  I was able to encounter the mass which was actually more dominant on the left posterior aspect.  While bulky and fibrotic at the center it was mobile.  Came over a rectal fold.    Was able to identify the margins distally medially posterior laterally and even proximally above the distal rectal fold.  I then did a full thickness transection at the distal margin.  I came around laterally.  Switched over to harmonic dissection.  Lifted the specimen off the pelvic canal for a good deep margin.  I gradually came more proximally.  Transected at the proximal margin.  I ensured hemostasis.  I inspected the main specimen and pinned it on thick cork board.  Pins as noted above.  Even though I felt like I had 1 cm margins grossly it seemed that it was close distally and proximal laterally.  Therefore I excised a 1 cm strip of rectum in a transverse arc at the proximal and lateral margin.  I then excised a 5-7 mm strip in an arc over the hemorrhoidal piles for a better distal margin.  I walked the specimen down to pathology and showed the [facility] pathology team for proper orientation.    I went back in and scrubbed in.  Hemostasis was good.  The anterior peritoneal fraction had been breached.  Small bowel did come down but I was able to push it back up.  I reapproximated the anterior peritoneal reflection with interrupted 2-0 Vicryl sutures.  He had very redundant rectum that  could come down to the anal verge.  I reapproximated the wound transversely with a 2-0 Vicryl suture.  I then used 2-0 V-lock serrated absorbable suture in a running fashion from each corner, meeting in the center of the closure.  Started posterior midline bleeding left anterior.  Then right anterior and bleeding left anterior.  There was some redundancy of the left anterior rectum that had somewhat split.  I reapproximated this longitudinally with the V-Locks overlapping there. This brought things together well.  I did meticulous inspection with fine tip instruments to confirm good watertight closure   Hemostasis excellent.  The lumen was quite patent.  Carbon dioxide evacuated & instruments removed.  The patient is being extubated go to recovery room.  I called his wife but she did not answer.  I discussed operative findings, updated the patient's status, discussed probable steps to recovery, and gave postoperative recommendations to the  patient's daughter .  Recommendations were made.  Questions were answered.  She expressed understanding & appreciation.   Adin Hector, MD, FACS, MASCRS Gastrointestinal and Minimally Invasive Surgery    [address] [place], [place] [address] [phone] Main / Paging [phone] Fax

## 2020-09-25 NOTE — Anesthesia Procedure Notes (Signed)
Procedure Name: Intubation Date/Time: 09/25/2020 11:24 AM Performed by: Cynda Familia, CRNA Pre-anesthesia Checklist: Patient identified, Emergency Drugs available, Suction available and Patient being monitored Patient Re-evaluated:Patient Re-evaluated prior to induction Oxygen Delivery Method: Circle System Utilized Preoxygenation: Pre-oxygenation with 100% oxygen Induction Type: IV induction and Cricoid Pressure applied Ventilation: Mask ventilation without difficulty Laryngoscope Size: Miller and 2 Grade View: Grade I Tube type: Oral Number of attempts: 1 Airway Equipment and Method: Stylet and Oral airway Placement Confirmation: ETT inserted through vocal cords under direct vision, positive ETCO2 and breath sounds checked- equal and bilateral Secured at: 22 cm Tube secured with: Tape Dental Injury: Teeth and Oropharynx as per pre-operative assessment  Comments: Smooth IV induction Glennon Mac- intubation AM CRNA atraumatic- teeth and mouth as preop - bilat BS

## 2020-09-25 NOTE — H&P (Signed)
Ryan Mosley 09/25/2020  DOB: Oct 09, 1940  Patient Care Team: Susy Frizzle, MD as PCP - General (Family Medicine) Edythe Clarity, [facility] as Pharmacist (Pharmacist) Truitt Merle, MD as Consulting Physician (Hematology)  ` Patient sent for surgical consultation at the request of Dr. Karmen Bongo gastroenterology   Chief Complaint: Rectal bleeding with adenomatous polyp. ` ` The patient is an elderly active male.  He comes today with his wife.  He had episode of rectal bleeding that was concerning.  Was admitted and underwent colonoscopy.  Out to have friable distal rectal mass.  Polyp showed adenomatous polyp.  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  Concern for possible focus of adenocarcinoma but not certain.  CT scan showed no obvious mass or metastatic disease.  Discussions made for her options by gastroenterology.  Surgical consult requested.  Initially to see Dr. Dema Severin but was switched over to me given my expertise in transanal TAMIS/TEM resections.  Patient did not show for appointment last month.  There is some confusion.  Notes from the cancer center working to try and set up appointment.  Notes from his primary care physician was trying to get an appointment.  He had an episode of rectal bleeding again was in the hospital again last week.  Seen by one of our PAs in the hospital who helped work to get the appointment within a week.   Patient comes in with his wife.  She immediately started ask questions before I could close the door and introduced myself.  Questions about the mupirocin and other concerns related to the rectal polyp.  I tried answer a few questions and redirect.  Patient denies much history of rectal bleeding until the past month.  Usually moves his bowels twice a day.  He claims he can walk a half hour without difficulty.  No cardiac or pulmonary issues that he recalls.  He does not smoke.  He's been told he has diabetes.  He was tried on an  oral hypoglycemic.  He says metformin, records a glipizide.  He notes he had worsening diarrhea so he's trying to manage it without medicines.  Sounds like he checks his glucose is home and they're less than 120.  Hemoglobin A1c was 5.3 in the hospital.  No sleep apnea.  No prior abdominal surgery.  He never had any prior colonoscopies.  No personal nor family history of GI/colon cancer, inflammatory bowel disease, irritable bowel syndrome, allergy such as Celiac Sprue, dietary/dairy problems, colitis, ulcers nor gastritis.  No recent sick contacts/gastroenteritis.  No travel outside the country.  No changes in diet.  No dysphagia to solids or liquids.  No significant heartburn or reflux.  No melena, hematemesis, coffee ground emesis.  No evidence of prior gastric/peptic ulceration.  Patient initially against surgery but then was convinced to reconsider.  Discussed with gastrology who encouraged him to consider surgery as well.  He is ready to do surgery today.   (Review of systems as stated in this history (HPI) or in the review of systems.  Otherwise all other 12 point ROS are negative) ` ` ###########################################`   This patient encounter took 40 minutes today to perform the following: obtain history, perform exam, review outside records, interpret tests & imaging, counsel the patient on their diagnosis; and, document this encounter, including findings & plan in the electronic health record (EHR).     Past Surgical History (Charmella Little, CNA; 08/20/2020 11:48 AM) No pertinent past surgical history  Allergies (Charmella Little, CNA; 08/20/2020 11:49 AM) No Known Drug Allergies  [08/20/2020]:   Medication History (Charmella Little, CNA; 08/20/2020 11:49 AM) amLODIPine Besylate  ('5MG'$  Tablet, Oral) Active. Latanoprost  (0.005% Solution, Ophthalmic) Active. Lisinopril  ('40MG'$  Tablet, Oral) Active. Rosuvastatin Calcium  ('5MG'$  Tablet, Oral) Active. Tamsulosin HCl  (0.'4MG'$   Capsule, Oral) Active.   Social History (Charmella Little, CNA; 08/20/2020 11:49 AM) No alcohol use  No caffeine use  No drug use  Tobacco use  Former smoker.   Family History (Charmella Little, CNA; 08/20/2020 11:48 AM) Family history unknown  First Degree Relatives    Other Problems (Charmella Little, CNA; 08/20/2020 11:49 AM) No pertinent past medical history          Review of Systems  General Not Present- Appetite Loss, Chills, Fatigue, Fever, Night Sweats, Weight Gain and Weight Loss. Skin Not Present- Change in Wart/Mole, Dryness, Hives, Jaundice, New Lesions, Non-Healing Wounds, Rash and Ulcer. HEENT Not Present- Earache, Hearing Loss, Hoarseness, Nose Bleed, Oral Ulcers, Ringing in the Ears, Seasonal Allergies, Sinus Pain, Sore Throat, Visual Disturbances, Wears glasses/contact lenses and Yellow Eyes. Respiratory Not Present- Bloody sputum, Chronic Cough, Difficulty Breathing, Snoring and Wheezing. Breast Not Present- Breast Mass, Breast Pain, Nipple Discharge and Skin Changes. Cardiovascular Not Present- Chest Pain, Difficulty Breathing Lying Down, Leg Cramps, Palpitations, Rapid Heart Rate, Shortness of Breath and Swelling of Extremities. Gastrointestinal Not Present- Abdominal Pain, Bloating, Bloody Stool, Change in Bowel Habits, Chronic diarrhea, Constipation, Difficulty Swallowing, Excessive gas, Gets full quickly at meals, Hemorrhoids, Indigestion, Nausea, Rectal Pain and Vomiting. Male Genitourinary Not Present- Blood in Urine, Change in Urinary Stream, Frequency, Impotence, Nocturia, Painful Urination, Urgency and Urine Leakage. Musculoskeletal Not Present- Back Pain, Joint Pain, Joint Stiffness, Muscle Pain, Muscle Weakness and Swelling of Extremities. Neurological Not Present- Decreased Memory, Fainting, Headaches, Numbness, Seizures, Tingling, Tremor, Trouble walking and Weakness. Psychiatric Not Present- Anxiety, Bipolar, Change in Sleep Pattern, Depression, Fearful and  Frequent crying. Endocrine Not Present- Cold Intolerance, Excessive Hunger, Hair Changes, Heat Intolerance, Hot flashes and New Diabetes. Hematology Not Present- Blood Thinners, Easy Bruising, Excessive bleeding, Gland problems, HIV and Persistent Infections.   Vitals (Charmella Little CNA; 08/20/2020 11:49 AM) 08/20/2020 11:49 AM Weight: 162.25 lb   Height: 69 in  Body Surface Area: 1.89 m   Body Mass Index: 23.96 kg/m   Temp.: 98.4 F    Pulse: 89 (Regular)    P.OX: 100% (Room air) BP: 150/78(Sitting, Left Arm, Standard)        09/25/2020 BP (!) 169/81   Pulse 75   Temp 97.8 F (36.6 C) (Oral)   Resp 15   Ht '5\' 9"'$  (1.753 m)   Wt 71.4 kg   SpO2 98%   BMI 23.24 kg/m         Physical Exam    General Mental Status - Alert. General Appearance - Not in acute distress, Not Sickly. Orientation - Oriented X3. Hydration - Well hydrated. Voice - Normal. Note:  Moves around slowly but steadily.   Integumentary Global Assessment Upon inspection and palpation of skin surfaces of the - Axillae: non-tender, no inflammation or ulceration, no drainage. and Distribution of scalp and body hair is normal. General Characteristics Temperature - normal warmth is noted.   Head and Neck Head - normocephalic, atraumatic with no lesions or palpable masses. Face Global Assessment - atraumatic, no absence of expression. Neck Global Assessment - no abnormal movements, no bruit auscultated on the right, no bruit auscultated on the left, no decreased range  of motion, non-tender. Trachea - midline. Thyroid Gland Characteristics - non-tender.   Eye Eyeball - Left - Extraocular movements intact, No Nystagmus - Left. Eyeball - Right - Extraocular movements intact, No Nystagmus - Right. Cornea - Left - No Hazy - Left. Cornea - Right - No Hazy - Right. Sclera/Conjunctiva - Left - No scleral icterus, No Discharge - Left. Sclera/Conjunctiva - Right - No scleral icterus, No Discharge -  Right. Pupil - Left - Direct reaction to light normal. Pupil - Right - Direct reaction to light normal. Note:  Wears glasses. Vision corrected   ENMT Ears Pinna - Left - no drainage observed, no generalized tenderness observed. Pinna - Right - no drainage observed, no generalized tenderness observed. Nose and Sinuses External Inspection of the Nose - no destructive lesion observed. Inspection of the nares - Left - quiet respiration. Inspection of the nares - Right - quiet respiration. Mouth and Throat Lips - Upper Lip - no fissures observed, no pallor noted. Lower Lip - no fissures observed, no pallor noted. Nasopharynx - no discharge present. Oral Cavity/Oropharynx - Tongue - no dryness observed. Oral Mucosa - no cyanosis observed. Hypopharynx - no evidence of airway distress observed. Note:  Hard of hearing   Chest and Lung Exam Inspection Movements - Normal and Symmetrical. Accessory muscles - No use of accessory muscles in breathing. Palpation Palpation of the chest reveals - Non-tender. Auscultation Breath sounds - Normal and Clear.   Cardiovascular Auscultation Rhythm - Regular. Murmurs & Other Heart Sounds - Auscultation of the heart reveals - No Murmurs and No Systolic Clicks.   Abdomen Inspection Inspection of the abdomen reveals - No Visible peristalsis and No Abnormal pulsations. Umbilicus - No Bleeding, No Urine drainage. Palpation/Percussion Palpation and Percussion of the abdomen reveal - Soft, Non Tender, No Rebound tenderness, No Rigidity (guarding) and No Cutaneous hyperesthesia. Note:  Abdomen soft.  Nontender.  Not distended.  No umbilical or incisional hernias.  No guarding.   Male Genitourinary Sexual Maturity Tanner 5 - Adult hair pattern and Adult penile size and shape. Note:  No inguinal hernias. Normal external genitalia. Epididymi, testes, and spermatic cords normal without any masses.   Rectal Note:  Examination don't patient in decubitus.    Perianal region clear without any fistulas or abscess or any external hemorrhoids. Coccyx without any pain or pilonidal disease. Normal sphincter tone.   He does have some mild narrowing versus spasming of his anal sphincter but eventually relaxes. I can palpate a frond-like mass in the rectum about 2-3 cm above the sphincters. It is not hard or rocky. It is friable and bleeds easily Seems to be on a more narrow stalk. It is mobile. It seems mainly focused on the left lateral to left anterior part of the rectal wall. I can feel above it. The base of the stalk feels about 3 cm wide.   Peripheral Vascular Upper Extremity Inspection - Left - No Cyanotic nailbeds - Left, Not Ischemic. Inspection - Right - No Cyanotic nailbeds - Right, Not Ischemic.   Neurologic Neurologic evaluation reveals  - normal attention span and ability to concentrate, able to name objects and repeat phrases. Appropriate fund of knowledge , normal sensation and normal coordination. Mental Status Affect - not angry, not paranoid. Cranial Nerves - Normal Bilaterally. Gait - Normal.   Neuropsychiatric Mental status exam performed with findings of - able to articulate well with normal speech/language, rate, volume and coherence, thought content normal with ability to perform basic computations  and apply abstract reasoning and no evidence of hallucinations, delusions, obsessions or homicidal/suicidal ideation. Note:  Interrupted often with other questions. Somewhat tangential. Not quite to the point of mania or word salad.   Musculoskeletal Global Assessment Spine, Ribs and Pelvis - no instability, subluxation or laxity. Right Upper Extremity - no instability, subluxation or laxity.   Lymphatic Head & Neck   General Head & Neck Lymphatics: Bilateral - Description - No Localized lymphadenopathy. Axillary   General Axillary Region: Bilateral - Description - No Localized lymphadenopathy. Femoral & Inguinal   Generalized  Femoral & Inguinal Lymphatics: Left - Description - No Localized lymphadenopathy. Right - Description - No Localized lymphadenopathy.       Assessment & Plan    ADENOMATOUS RECTAL POLYP (D12.8) Impression: Bleeding left lateral distal rectal polyp. 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 There was concerned perhaps for small focus of malignancy possible but seems soft and friable. I think because it is bleeding and causing issues I would start with transanal excision. TEM vs robotic TAMIS for a full-thickness partial proctectomy for margins. Hopefully just an overnight stay.   Cleared by surgery  They appear ready to consider surgery. We will try and get this done in a timely fashion.      The anatomy & physiology of the digestive tract was discussed.  The pathophysiology of the rectal pathology was discussed.  Natural history risks without surgery was discussed.   I feel the risks of no intervention will lead to serious problems that outweigh the operative risks; therefore, I recommended surgery.    Laparoscopic & open abdominal techniques were discussed.  I recommended we start with a partial proctectomy by transanal endoscopic microsurgery (TEM) for excisional biopsy to remove the pathology and hopefully cure and/or control the pathology.  This technique can offer less operative risk and faster post-operative recovery.  Possible need for immediate or later abdominal surgery for further treatment was discussed.   Risks such as bleeding, abscess, reoperation, ostomy, heart attack, death, and other risks were discussed.   I noted a good likelihood this will help address the problem.  Goals of post-operative recovery were discussed as well.  We will work to minimize complications.  An educational handout was given as well.  Questions were answered.  The patient expresses understanding & wishes to proceed with surgery.       Adin Hector, MD, FACS, MASCRS Esophageal,  Gastrointestinal & Colorectal Surgery Robotic and Minimally Invasive Surgery  [facility], [place]  [place]. [address] [place], [place] [address] [phone] Fax [phone] Main  CONTACT INFORMATION:  Weekday (9AM-5PM): Call [facility] main office at [phone]  Weeknight (5PM-9AM) or Weekend/Holiday: Check [email or web address] (password " [email or web address]") for General Surgery [facility] coverage  (Please, do not use SecureChat as it is not reliable communication to operating surgeons for immediate patient care)

## 2020-09-26 ENCOUNTER — Encounter (HOSPITAL_COMMUNITY): Payer: Self-pay | Admitting: Surgery

## 2020-09-26 LAB — CBC
HCT: 35.5 % — ABNORMAL LOW (ref 39.0–52.0)
Hemoglobin: 10.6 g/dL — ABNORMAL LOW (ref 13.0–17.0)
MCH: 26.6 pg (ref 26.0–34.0)
MCHC: 29.9 g/dL — ABNORMAL LOW (ref 30.0–36.0)
MCV: 89.2 fL (ref 80.0–100.0)
Platelets: 294 10*3/uL (ref 150–400)
RBC: 3.98 MIL/uL — ABNORMAL LOW (ref 4.22–5.81)
RDW: 15 % (ref 11.5–15.5)
WBC: 24.7 10*3/uL — ABNORMAL HIGH (ref 4.0–10.5)
nRBC: 0 % (ref 0.0–0.2)

## 2020-09-26 LAB — BASIC METABOLIC PANEL
Anion gap: 9 (ref 5–15)
BUN: 12 mg/dL (ref 8–23)
CO2: 20 mmol/L — ABNORMAL LOW (ref 22–32)
Calcium: 8.8 mg/dL — ABNORMAL LOW (ref 8.9–10.3)
Chloride: 107 mmol/L (ref 98–111)
Creatinine, Ser: 1.16 mg/dL (ref 0.61–1.24)
GFR, Estimated: >60 mL/min (ref >60)
Glucose, Bld: 160 mg/dL — ABNORMAL HIGH (ref 70–99)
Potassium: 3.7 mmol/L (ref 3.5–5.1)
Sodium: 136 mmol/L (ref 135–145)

## 2020-09-26 LAB — HEMOGLOBIN A1C
Hgb A1c MFr Bld: 6 % — ABNORMAL HIGH (ref 4.8–5.6)
Mean Plasma Glucose: 125.5 mg/dL

## 2020-09-26 LAB — MAGNESIUM: Magnesium: 2 mg/dL (ref 1.7–2.4)

## 2020-09-26 NOTE — Discharge Summary (Signed)
Physician Discharge Summary    Patient ID: Ryan Mosley MRN: [id number] DOB/AGE: 1940-12-29  80 y.o.  Patient Care Team: Susy Frizzle, MD as PCP - General (Family Medicine) Edythe Clarity, [facility] as Pharmacist (Pharmacist) Truitt Merle, MD as Consulting Physician (Hematology) Michael Boston, MD as Consulting Physician (General Surgery) Thornton Park, MD as Consulting Physician (Gastroenterology)  Admit date: 09/25/2020  Discharge date: 09/26/2020  Hospital Stay = 1 days    Discharge Diagnoses:  Principal Problem:   Rectal mass s/p partial proctectomy 09/25/2020 Active Problems:   Hypertension   Diabetes mellitus without complication ([place])   Hyperlipemia   Allergic rhinitis   Mass in rectum   1 Day Post-Op  09/25/2020  POST-OPERATIVE DIAGNOSIS:   RECTAL POLYP  SURGERY:  09/25/2020  Procedure(s): TAMIS PARTIAL PROCTECTOMY OF RECTAL MASS  SURGEON:    Surgeon(s): Michael Boston, MD  Consults: None  Hospital Course:   The patient underwent the surgery above.  Postoperatively, the patient gradually mobilized and advanced to a solid diet.  Pain and other symptoms were treated aggressively.    By the time of discharge, the patient was walking well the hallways, eating food, having flatus.  Pain was well-controlled on an oral medications.  Based on meeting discharge criteria and continuing to recover, I felt it was safe for the patient to be discharged from the hospital to further recover with close followup. Postoperative recommendations were discussed in detail.  They are written as well.  Discharged Condition: good  Discharge Exam: Blood pressure 131/71, pulse 73, temperature 98.6 F (37 C), temperature source Oral, resp. rate 18, height '5\' 9"'$  (1.753 m), weight 71.4 kg, SpO2 94 %.  General: Pt awake/alert/oriented x4 in No acute distress Eyes: PERRL, normal EOM.  Sclera clear.  No icterus Neuro: CN II-XII intact w/o focal sensory/motor deficits. Lymph: No  head/neck/groin lymphadenopathy Psych:  No delerium/psychosis/paranoia HENT: Normocephalic, Mucus membranes moist.  No thrush Neck: Supple, No tracheal deviation Chest:  No chest wall pain w good excursion CV:  Pulses intact.  Regular rhythm MS: Normal AROM mjr joints.  No obvious deformity Abdomen: Soft.  Nondistended.  Nontender.  No evidence of peritonitis.  No incarcerated hernias. GU:  no hernia Rectal;  no active bleeding/leaking.  Min TTP Ext:  SCDs BLE.  No mjr edema.  No cyanosis Skin: No petechiae / purpura   Disposition:    Follow-up Information     Michael Boston, MD Follow up in 3 week(s).   Specialties: General Surgery, Colon and Rectal Surgery Why: To follow up after your operation Contact information: [place] [address] [phone]                 Discharge disposition: 01-Home or Self Care       Discharge Instructions     Call MD for:  hives   Complete by: As directed    Call MD for:  hives   Complete by: As directed    Call MD for:  persistant dizziness or light-headedness   Complete by: As directed    Call MD for:  persistant dizziness or light-headedness   Complete by: As directed    Call MD for:  persistant nausea and vomiting   Complete by: As directed    Call MD for:  persistant nausea and vomiting   Complete by: As directed    Call MD for:  redness, tenderness, or signs of infection (pain, swelling, redness, odor or green/yellow discharge around incision  site)   Complete by: As directed    You will often notice bleeding with bowel movements.  Expect some yellow or tan drainage.  This can occur for weeks, but should be mild by the end of the first week of surgery.  Wear an absorbent pad or soft cotton gauze in your underwear until the drainage stops   Call MD for:  redness, tenderness, or signs of infection (pain, swelling, redness, odor or green/yellow discharge around incision site)   Complete by: As  directed    You will often notice bleeding with bowel movements.  Expect some yellow or tan drainage.  This can occur for weeks, but should be mild by the end of the first week of surgery.  Wear an absorbent pad or soft cotton gauze in your underwear until the drainage stops   Call MD for:  severe uncontrolled pain   Complete by: As directed    Call MD for:  severe uncontrolled pain   Complete by: As directed    Diet - low sodium heart healthy   Complete by: As directed    Discharge instructions   Complete by: As directed    See Rectal Surgery instruction sheet   Discharge wound care:   Complete by: As directed    -Allow any ribbon or fluffed gauze to fall out with the 1st bowel movement -Bathe / shower every day.  Just warm water.  Avoid salts/soaps.  Keep the area clean by showering / bathing over the incision / wound.   It is okay to soak an open wound to help wash it.   -Wet wipes or showers / gentle washing after bowel movements is often less traumatic than regular toilet paper.  Consider using a squeeze bottle of warm water to rinse the perianal region. -You will notice bleeding & yellow drainage with bowel movements.  This should slow down by the end of the first week of surgery, but expect some drainage for many weeks.  Wear an absorbent pad or soft cotton gauze in your underwear as needed to catch any drainage and help keep the area clean and dry.   Discharge wound care:   Complete by: As directed    -Bathe / shower every day.  Just warm water.  Avoid salts/soaps.  Keep the area clean by showering / bathing over the incision / wound.   It is okay to soak an open wound to help wash it.   -Wet wipes or showers / gentle washing after bowel movements is often less traumatic than regular toilet paper.  Consider using a squeeze bottle of warm water to rinse the perianal region. -You will notice bleeding & yellow drainage with bowel movements.  This should slow down by the end of the first  week of surgery, but expect some drainage for many weeks.  Wear an absorbent pad or soft cotton gauze in your underwear as needed to catch any drainage and help keep the area clean and dry.   Driving Restrictions   Complete by: As directed    You may drive when you are no longer taking prescription pain medication, you can comfortably sit for long periods of time, and you can safely maneuver your car and apply brakes.   Driving Restrictions   Complete by: As directed    You may drive when you are no longer taking prescription pain medication, you can comfortably sit for long periods of time, and you can safely maneuver your car and apply brakes.  Increase activity slowly   Complete by: As directed    Increase activity slowly   Complete by: As directed    Lifting restrictions   Complete by: As directed    You may resume regular (light) daily activities beginning the next day-such as daily self-care, walking, climbing stairs-gradually increasing activities as tolerated.  If you can walk 30 minutes without difficulty, it is safe to try more intense activity such as jogging, treadmill, bicycling, low-impact aerobics, swimming, etc. Save the most intensive and strenuous activity for last such as sit-ups, heavy lifting, contact sports, etc  Refrain from any heavy lifting or straining until you are off narcotics for pain control.  Remember: if it hurts to do it, don't do it: STOP   Lifting restrictions   Complete by: As directed    You may resume regular (light) daily activities beginning the next day-such as daily self-care, walking, climbing stairs-gradually increasing activities as tolerated.  If you can walk 30 minutes without difficulty, it is safe to try more intense activity such as jogging, treadmill, bicycling, low-impact aerobics, swimming, etc. Save the most intensive and strenuous activity for last such as sit-ups, heavy lifting, contact sports, etc  Refrain from any heavy lifting or  straining until you are off narcotics for pain control.  Remember: if it hurts to do it, don't do it: STOP   May shower / Bathe   Complete by: As directed    Warm water sitz baths/tub soaks x 20-30 minutes, 4-8 times a day for comfort   May shower / Bathe   Complete by: As directed    Warm water sitz baths/tub soaks x 20-30 minutes, 4-8 times a day for comfort   May walk up steps   Complete by: As directed    May walk up steps   Complete by: As directed    Sexual Activity Restrictions   Complete by: As directed    You may have sexual intercourse when it is comfortable. If it hurts to do it, STOP.   Sexual Activity Restrictions   Complete by: As directed    You may have sexual intercourse when it is comfortable. If it hurts to do it, STOP.       Allergies as of 09/26/2020   No Known Allergies      Medication List     TAKE these medications    acetaminophen 325 MG tablet Commonly known as: TYLENOL Take 650 mg by mouth daily.   ADVIL COLD/SINUS PO Take 1 tablet by mouth daily as needed (sinus relief).   latanoprost 0.005 % ophthalmic solution Commonly known as: XALATAN Place 1 drop into both eyes at bedtime.   oxyCODONE 5 MG immediate release tablet Commonly known as: Oxy IR/ROXICODONE Take 1-2 tablets (5-10 mg total) by mouth every 6 (six) hours as needed for moderate pain, severe pain or breakthrough pain.       ASK your doctor about these medications    amLODipine 5 MG tablet Commonly known as: NORVASC TAKE 1 TABLET BY MOUTH EVERY DAY FOR BLOOD PRESSURE   ferrous sulfate 325 (65 FE) MG EC tablet Take 1 tablet (325 mg total) by mouth 2 (two) times daily.   rosuvastatin 5 MG tablet Commonly known as: CRESTOR TAKE 1 TABLET BY MOUTH ONCE A WEEK FOR CHOLESTEROL               Discharge Care Instructions  (From admission, onward)           Start  Ordered   09/26/20 0000  Discharge wound care:       Comments: -Bathe / shower every day.  Just  warm water.  Avoid salts/soaps.  Keep the area clean by showering / bathing over the incision / wound.   It is okay to soak an open wound to help wash it.   -Wet wipes or showers / gentle washing after bowel movements is often less traumatic than regular toilet paper.  Consider using a squeeze bottle of warm water to rinse the perianal region. -You will notice bleeding & yellow drainage with bowel movements.  This should slow down by the end of the first week of surgery, but expect some drainage for many weeks.  Wear an absorbent pad or soft cotton gauze in your underwear as needed to catch any drainage and help keep the area clean and dry.   09/26/20 0800   09/25/20 0000  Discharge wound care:       Comments: -Allow any ribbon or fluffed gauze to fall out with the 1st bowel movement -Bathe / shower every day.  Just warm water.  Avoid salts/soaps.  Keep the area clean by showering / bathing over the incision / wound.   It is okay to soak an open wound to help wash it.   -Wet wipes or showers / gentle washing after bowel movements is often less traumatic than regular toilet paper.  Consider using a squeeze bottle of warm water to rinse the perianal region. -You will notice bleeding & yellow drainage with bowel movements.  This should slow down by the end of the first week of surgery, but expect some drainage for many weeks.  Wear an absorbent pad or soft cotton gauze in your underwear as needed to catch any drainage and help keep the area clean and dry.   09/25/20 1410            Significant Diagnostic Studies:  Results for orders placed or performed during the hospital encounter of 09/25/20 (from the past 72 hour(s))  Glucose, capillary     Status: Abnormal   Collection Time: 09/25/20  8:51 AM  Result Value Ref Range   Glucose-Capillary 126 (H) 70 - 99 mg/dL    Comment: Glucose reference range applies only to samples taken after fasting for at least 8 hours.  Type and screen [place]     Status: None   Collection Time: 09/25/20  9:09 AM  Result Value Ref Range   ABO/RH(D) A POS    Antibody Screen NEG    Sample Expiration      09/28/2020,2359 Performed at [facility], [place] [address], [place], [place] [address]   Glucose, capillary     Status: Abnormal   Collection Time: 09/25/20  2:32 PM  Result Value Ref Range   Glucose-Capillary 140 (H) 70 - 99 mg/dL    Comment: Glucose reference range applies only to samples taken after fasting for at least 8 hours.  Hemoglobin A1c     Status: Abnormal   Collection Time: 09/25/20  6:57 PM  Result Value Ref Range   Hgb A1c MFr Bld 6.0 (H) 4.8 - 5.6 %    Comment: (NOTE) Pre diabetes:          5.7%-6.4%  Diabetes:              >6.4%  Glycemic control for   <7.0% adults with diabetes    Mean Plasma Glucose 125.5 mg/dL  Comment: Performed at [facility], [place] [address] [place], [place] [id number]  Basic metabolic panel     Status: Abnormal   Collection Time: 09/26/20  4:37 AM  Result Value Ref Range   Sodium 136 135 - 145 mmol/L   Potassium 3.7 3.5 - 5.1 mmol/L   Chloride 107 98 - 111 mmol/L   CO2 20 (L) 22 - 32 mmol/L   Glucose, Bld 160 (H) 70 - 99 mg/dL    Comment: Glucose reference range applies only to samples taken after fasting for at least 8 hours.   BUN 12 8 - 23 mg/dL   Creatinine, Ser 1.16 0.61 - 1.24 mg/dL   Calcium 8.8 (L) 8.9 - 10.3 mg/dL   GFR, Estimated >60 >60 mL/min    Comment: (NOTE) Calculated using the CKD-EPI Creatinine Equation (2021)    Anion gap 9 5 - 15    Comment: Performed at [facility], [place] [address] [place], [place] [address]  CBC     Status: Abnormal   Collection Time: 09/26/20  4:37 AM  Result Value Ref Range   WBC 24.7 (H) 4.0 - 10.5 K/uL   RBC 3.98 (L) 4.22 - 5.81 MIL/uL   Hemoglobin 10.6 (L) 13.0 - 17.0 g/dL   HCT 35.5 (L) 39.0 - 52.0 %   MCV 89.2 80.0 - 100.0 fL   MCH 26.6 26.0 - 34.0 pg   MCHC 29.9 (L)  30.0 - 36.0 g/dL   RDW 15.0 11.5 - 15.5 %   Platelets 294 150 - 400 K/uL   nRBC 0.0 0.0 - 0.2 %    Comment: Performed at [facility], [place] [address] [place], [place] [address]  Magnesium     Status: None   Collection Time: 09/26/20  4:37 AM  Result Value Ref Range   Magnesium 2.0 1.7 - 2.4 mg/dL    Comment: Performed at [facility], [place] [address], [place],  [address]    No results found.  Past Medical History:  Diagnosis Date   Acute blood loss anemia    on meds   Blood transfusion without reported diagnosis 2022   Cataract    bilateral sx   Diabetes mellitus without complication ([place])    not on meds at this time   GERD (gastroesophageal reflux disease)    hx of   GI bleed    Hyperlipidemia    on meds   Hypertension    on meds   Meningitis 1960/1970   Pneumonia 1960/1970   Rectal polyp     Past Surgical History:  Procedure Laterality Date   BIOPSY  07/08/2020   Procedure: BIOPSY;  Surgeon: Thornton Park, MD;  Location: [facility];  Service: Gastroenterology;;   COLONOSCOPY WITH PROPOFOL N/A 07/08/2020   Procedure: COLONOSCOPY WITH PROPOFOL;  Surgeon: Thornton Park, MD;  Location: [facility];  Service: Gastroenterology;  Laterality: N/A;   DENTAL SURGERY     all teeth removed   XI ROBOT ASSISTED TRANSANAL RESECTION N/A 09/25/2020   Procedure: TAMIS PARTIAL PROCTECTOMY OF RECTAL MASS;  Surgeon: Michael Boston, MD;  Location: [facility];  Service: General;  Laterality: N/A;    Social History   Socioeconomic History   Marital status: Married    Spouse name: Not on file   Number of children: Not on file   Years of education: Not on file   Highest education level: Not on file  Occupational History   Not on file  Tobacco  Use   Smoking status: Former   Smokeless tobacco: Current    Types: Nurse, children's Use: Never used  Substance and Sexual Activity   Alcohol use: Not Currently    Alcohol/week: 1.0  standard drink    Types: 1 Standard drinks or equivalent per week   Drug use: Never   Sexual activity: Not on file  Other Topics Concern   Not on file  Social History Narrative   Not on file   Social Determinants of Health   Financial Resource Strain: Not on file  Food Insecurity: Not on file  Transportation Needs: Not on file  Physical Activity: Not on file  Stress: Not on file  Social Connections: Not on file  Intimate Partner Violence: Not on file    Family History  Problem Relation Age of Onset   Seizures Daughter    Diabetes Son    Colon polyps Neg Hx    Colon cancer Neg Hx    Esophageal cancer Neg Hx    Rectal cancer Neg Hx    Stomach cancer Neg Hx     Current Facility-Administered Medications  Medication Dose Route Frequency Provider Last Rate Last Admin   0.9 %  sodium chloride infusion   Intravenous Q8H PRN Michael Boston, MD       acetaminophen (TYLENOL) tablet 1,000 mg  1,000 mg Oral Lajuana Ripple, MD   1,000 mg at 09/26/20 0601   alum & mag hydroxide-simeth (MAALOX/MYLANTA) 200-200-20 MG/5ML suspension 30 mL  30 mL Oral Q6H PRN Michael Boston, MD       alvimopan (ENTEREG) capsule 12 mg  12 mg Oral BID Michael Boston, MD       amLODipine (NORVASC) tablet 5 mg  5 mg Oral Daily Michael Boston, MD       Chlorhexidine Gluconate Cloth 2 % PADS 6 each  6 each Topical Once Michael Boston, MD       diphenhydrAMINE (BENADRYL) 12.5 MG/5ML elixir 12.5 mg  12.5 mg Oral Q6H PRN Michael Boston, MD       Or   diphenhydrAMINE (BENADRYL) injection 12.5 mg  12.5 mg Intravenous Q6H PRN Michael Boston, MD       enalaprilat (VASOTEC) injection 0.625-1.25 mg  0.625-1.25 mg Intravenous Q6H PRN Michael Boston, MD       enoxaparin (LOVENOX) injection 40 mg  40 mg Subcutaneous Q24H Michael Boston, MD       feeding supplement (ENSURE SURGERY) liquid 237 mL  237 mL Oral BID BM Elleanor Guyett, Remo Lipps, MD       HYDROmorphone (DILAUDID) injection 0.5-2 mg  0.5-2 mg Intravenous Q4H PRN Michael Boston, MD        lactated ringers infusion   Intravenous Continuous Michael Boston, MD 50 mL/hr at 09/25/20 1740 New Bag at 09/25/20 1740   latanoprost (XALATAN) 0.005 % ophthalmic solution 1 drop  1 drop Both Eyes QHS Michael Boston, MD   1 drop at 09/25/20 2049   lip balm (CARMEX) ointment 1 application  1 application Topical BID Michael Boston, MD   1 application at [id number]   liver oil-zinc oxide (DESITIN) 40 % ointment   Topical BID Michael Boston, MD       magic mouthwash  15 mL Oral QID PRN Michael Boston, MD       melatonin tablet 3 mg  3 mg Oral QHS PRN Michael Boston, MD       metoprolol tartrate (LOPRESSOR) injection 5 mg  5 mg Intravenous  Q6H PRN Michael Boston, MD       ondansetron [facility]) tablet 4 mg  4 mg Oral Q6H PRN Michael Boston, MD       Or   ondansetron [facility]) injection 4 mg  4 mg Intravenous Q6H PRN Michael Boston, MD       polycarbophil (FIBERCON) tablet 625 mg  625 mg Oral BID Michael Boston, MD   625 mg at 09/25/20 2050   prochlorperazine (COMPAZINE) tablet 10 mg  10 mg Oral Q6H PRN Michael Boston, MD       Or   prochlorperazine (COMPAZINE) injection 5-10 mg  5-10 mg Intravenous Q6H PRN Michael Boston, MD       simethicone (MYLICON) chewable tablet 40 mg  40 mg Oral Q6H PRN Michael Boston, MD       traMADol Veatrice Bourbon) tablet 50-100 mg  50-100 mg Oral Q6H PRN Michael Boston, MD       witch hazel-glycerin (TUCKS) pad 1 application  1 application Topical PRN Michael Boston, MD         No Known Allergies  Signed: Morton Peters, MD, FACS, MASCRS Esophageal, Gastrointestinal & Colorectal Surgery Robotic and Minimally Invasive Surgery  [facility], [place]. [address] [place], [place] [address] [phone] Fax [phone] Main  CONTACT INFORMATION:  Weekday (9AM-5PM): Call [facility] main office at [phone]  Weeknight (5PM-9AM) or Weekend/Holiday: Check [email or web address] (password " [email or web address]") for  General Surgery [facility] coverage  (Please, do not use SecureChat as it is not reliable communication to operating surgeons for immediate patient care)      09/26/2020, 8:01 AM

## 2020-09-26 NOTE — Progress Notes (Signed)
Patient was given discharge instructions, and all questions were answered.  Patient was stable for discharge and was taken to the main exit by wheelchair. 

## 2020-09-30 ENCOUNTER — Other Ambulatory Visit: Payer: Self-pay

## 2020-09-30 DIAGNOSIS — R238 Other skin changes: Secondary | ICD-10-CM | POA: Diagnosis not present

## 2020-09-30 NOTE — Progress Notes (Signed)
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  [place]:   1.  Please set the patient up for GI Tumor Board.  Patient Care Team: Susy Frizzle, MD as PCP - General (Family Medicine) Edythe Clarity, [facility] as Pharmacist (Pharmacist) Truitt Merle, MD as Consulting Physician (Hematology) Michael Boston, MD as Consulting Physician (General Surgery) Thornton Park, MD as Consulting Physician (Gastroenterology)   2.   Please set up outpatient consult(s) Dr. Morey Hummingbird and Dr. Lisbeth Renshaw to discuss possible post adjuvant chemoradiation therapy &  watchful waiting protocol.(Patient has declined low anterior resection in the past)

## 2020-10-01 ENCOUNTER — Telehealth: Payer: Self-pay | Admitting: Gastroenterology

## 2020-10-01 ENCOUNTER — Telehealth: Payer: Self-pay | Admitting: Hematology

## 2020-10-01 LAB — SURGICAL PATHOLOGY

## 2020-10-01 NOTE — Telephone Encounter (Signed)
Received a new pt referral from Dr. Johney Maine for dx of rectal cancer. Ryan Mosley has been scheduled to see Dr. Burr Medico on 8/18 at 3pm. Appt date andtime has been given to the pt's wife.

## 2020-10-01 NOTE — Telephone Encounter (Signed)
Patients daughter called states she would like to speak with Dr. Tarri Glenn regarding her fathers treatment and what is really going on since he had the rectal mass procedure. The family is really concerned she would also want to know when he is to follow up in the office.

## 2020-10-01 NOTE — Telephone Encounter (Signed)
I called [phone]. The call goes directly to voicemail. I asked the daughter to call back with a better phone number. Thanks.

## 2020-10-01 NOTE — Telephone Encounter (Signed)
Patients daughter returned the call and said the number on file is the best asking if you can please call her back.

## 2020-10-01 NOTE — Telephone Encounter (Signed)
Pts daughter requesting to speak with Dr. Tarri Glenn directly regarding her father.

## 2020-10-02 NOTE — Telephone Encounter (Signed)
Patient, his wife, and his daughter were on the call when I returned her message. They had multiple questions about his pathology results and management plan. I answered to the best of my ability and encouraged follow-up with both oncology and radiation oncology. Recommended that all of his children start colon cancer screening at age 79 if they have not already had a colonoscopy.

## 2020-10-03 NOTE — Progress Notes (Signed)
GI Location of Tumor / Histology: Rectal Cancer  Keatan Vandevoort presented with rectal bleeding.  He was seen and underwent colonoscopy.  CT chest 07/08/2020: 1.4 by 1.1 cm sub solid nodule in the superior segment left lower lobe. This is not typical for metastatic disease but could be inflammatory or conceivably low-grade adenocarcinoma. There is also a 3 mm left lower lobe nodule.  CTA AP 07/07/2020: No obvious mass or metastatic disease.  Biopsies of Rectal Polyps 09/25/2020    Past/Anticipated interventions by surgeon, if any:  Dr. Johney Maine - TAMIS partial proctectomy of rectal mass 09/25/2020  Past/Anticipated interventions by medical oncology, if any:  Dr. Burr Medico 10/09/2020   Weight changes, if any: few pounds down due to decreased appetite.  Bowel/Bladder complaints, if any: Denies constipation, diarrhea, or urinary changes.  Nausea / Vomiting, if any: no  Pain issues, if any: Has some soreness from surgery.  Any blood per rectum:  no   SAFETY ISSUES: Prior radiation? no Pacemaker/ICD? No Possible current pregnancy? N/a Is the patient on methotrexate? no  Current Complaints/Details:

## 2020-10-07 ENCOUNTER — Ambulatory Visit
Admission: RE | Admit: 2020-10-07 | Discharge: 2020-10-07 | Disposition: A | Discharge status: Home / Self Care | Payer: Medicare Other | Source: Ambulatory Visit | Attending: Radiation Oncology | Admitting: Radiation Oncology

## 2020-10-07 ENCOUNTER — Encounter: Payer: Self-pay | Admitting: Radiation Oncology

## 2020-10-07 ENCOUNTER — Other Ambulatory Visit: Payer: Self-pay

## 2020-10-07 VITALS — Ht 69.0 in | Wt 158.0 lb

## 2020-10-07 DIAGNOSIS — C20 Malignant neoplasm of rectum: Secondary | ICD-10-CM | POA: Diagnosis not present

## 2020-10-07 NOTE — Progress Notes (Signed)
Radiation Oncology         [phone] ________________________________  Initial Outpatient Consultation - Conducted via telephone due to current COVID-19 concerns for limiting patient exposure  I spoke with the patient to conduct this consult visit via telephone to spare the patient unnecessary potential exposure in the healthcare setting during the current COVID-19 pandemic. The patient was notified in advance and was offered a [facility] meeting to allow for face to face communication but unfortunately reported that they did not have the appropriate resources/technology to support such a visit and instead preferred to proceed with a telephone consult.     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   HISTORY OF PRESENT ILLNESS: Ryan Mosley is a 80 y.o. male seen at the request of Dr. Johney Maine for a new diagnosis of rectal cancer.  The patient presented to the hospital with an episode of rectal bleeding and was admitted and underwent colonoscopy on 07/08/2020 a nonobstructing large mass was seen in the rectum involving 20 to 25% of the circumference measuring at least 4 cm in length, the location was at least 1 cm from the anal verge.  Pathology from the specimen showed fragments of tubular adenoma with focal high-grade glandular dysplasia.  He underwent sigmoidoscopy on 07/18/2020 the lesion was again identified and biopsies taken, final pathology again confirmed high-grade glandular dysplasia but it was focally suspicious at this time for invasion.  He was referred to Dr. Johney Maine and underwent transanal partial proctectomy of the rectal mass on 09/25/2020 the mass was approximately 6 x 6 cm in size and oriented to margins.  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  An MRI of the pelvis with and without contrast has been ordered.  In May 2022 during his initial hospitalization CT chest abdomen pelvis was performed and a 1.1 x 1.4 cm Solid nodule in the left lower lobe was identified as well as a 3 mm left lower lobe nodule it was recommended that he have repeat imaging in approximately 6 months stigmata of atherosclerotic disease and emphysema was identified, the liver had a simple appearing cyst in segment 4 measuring up to 3.7 cm.  No adenopathy was noted, prostamegaly was seen.  He is contacted today to discuss adjuvant treatment for his cancer.  He has an appointment to meet with Dr. Burr Medico on Thursday.    PREVIOUS RADIATION THERAPY: No   PAST MEDICAL HISTORY:  Past Medical History:  Diagnosis Date   Acute blood loss anemia    on meds   Blood transfusion without reported diagnosis 2022   Cataract    bilateral sx   Diabetes mellitus without complication ([place])    not on meds at this time   GERD (gastroesophageal reflux disease)    hx of   GI bleed    Hyperlipidemia    on meds   Hypertension    on meds   Meningitis 1960/1970   Pneumonia 1960/1970   Rectal polyp        PAST SURGICAL HISTORY:  Past Surgical History:  Procedure Laterality Date   BIOPSY  07/08/2020   Procedure: BIOPSY;  Surgeon: Thornton Park, MD;  Location: [place];  Service: Gastroenterology;;   COLONOSCOPY WITH PROPOFOL N/A 07/08/2020   Procedure: COLONOSCOPY WITH PROPOFOL;  Surgeon: Thornton Park, MD;  Location: [place];  Service: Gastroenterology;  Laterality: N/A;   DENTAL SURGERY     all teeth removed   XI ROBOT ASSISTED TRANSANAL RESECTION N/A 09/25/2020   Procedure: TAMIS PARTIAL PROCTECTOMY OF RECTAL  MASS;  Surgeon: Michael Boston, MD;  Location: [facility];  Service: General;  Laterality: N/A;     FAMILY HISTORY:  Family History  Problem Relation Age of Onset   Seizures Daughter    Diabetes Son    Colon polyps Neg Hx    Colon cancer Neg Hx    Esophageal cancer Neg Hx    Rectal cancer Neg Hx    Stomach cancer Neg Hx      SOCIAL HISTORY:  reports that he has quit smoking. His smokeless tobacco use includes chew. He reports that he does not currently use alcohol after a past usage of about 1.0 standard drink per week. He reports that he does not use drugs. The patient is married and lives in [place].    ALLERGIES: Patient has no known allergies.   MEDICATIONS:  Current Outpatient Medications  Medication Sig Dispense Refill   acetaminophen (TYLENOL) 325 MG tablet Take 650 mg by mouth daily.     amLODipine (NORVASC) 5 MG tablet TAKE 1 TABLET BY MOUTH EVERY DAY FOR BLOOD PRESSURE (Patient taking differently: Take 5 mg by mouth daily. BLOOD PRESSURE) 90 tablet 2   ferrous sulfate 325 (65 FE) MG EC tablet Take 1 tablet (325 mg total) by mouth 2 (two) times daily. (Patient taking differently: Take 325 mg by mouth every other day.) 60 tablet 3   latanoprost (XALATAN) 0.005 % ophthalmic solution Place 1 drop into both eyes at bedtime.  3   oxyCODONE (OXY IR/ROXICODONE) 5 MG immediate release tablet Take 1-2 tablets (5-10 mg total) by mouth every 6 (six) hours as needed for moderate pain, severe pain or breakthrough pain. 30 tablet 0   Pseudoephedrine-Ibuprofen (ADVIL COLD/SINUS PO) Take 1 tablet by mouth daily as needed (sinus relief).     rosuvastatin (CRESTOR) 5 MG tablet TAKE 1 TABLET BY MOUTH ONCE A WEEK FOR CHOLESTEROL (Patient taking differently: Take 5 mg by mouth once a week. FOR CHOLESTEROL) 12 tablet 2   No current facility-administered medications for this encounter.     REVIEW OF SYSTEMS: On review of systems, the patient reports that his bowels are moving multiple  times a day and he has a lot of mucous per rectum. He is no longer having rectal bleeding. He describes soreness deep in the pelvis but denies any symptoms of fevers, or urinary symptoms.      PHYSICAL EXAM:  Unable to assess due to encounter type.   ECOG = 1  0 - Asymptomatic (Fully active, able to carry on all predisease activities without restriction)  1 - Symptomatic but completely ambulatory (Restricted in physically strenuous activity but ambulatory and able to carry out work of a light or sedentary nature. For example, light housework, office work)  2 - Symptomatic, <50% in bed during the day (Ambulatory and capable of all self care but unable to carry out any work activities. Up and about more Mosley 50% of waking hours)  3 - Symptomatic, >50% in bed, but not bedbound (  Capable of only limited self-care, confined to bed or chair 50% or more of waking hours)  4 - Bedbound (Completely disabled. Cannot carry on any self-care. Totally confined to bed or chair)  5 - Death   Eustace Pen MM, Creech RH, Tormey DC, et al. [phone]). "Toxicity and response criteria of the Orth[employer] Ambulatory [employer] Group". [place] Oncol. 5 (6): 649-55    LABORATORY DATA:  Lab Results  Component Value Date   WBC 24.7 (H) 09/26/2020   HGB 10.6 (L) 09/26/2020   HCT 35.5 (L) 09/26/2020   MCV 89.2 09/26/2020   PLT 294 09/26/2020   Lab Results  Component Value Date   NA 136 09/26/2020   K 3.7 09/26/2020   CL 107 09/26/2020   CO2 20 (L) 09/26/2020   Lab Results  Component Value Date   ALT 14 08/16/2020   AST 16 08/16/2020   ALKPHOS 48 08/16/2020   BILITOT 0.4 08/16/2020      RADIOGRAPHY: No results found.     IMPRESSION/PLAN: 1. At least Stage IIA, pT3N0Mx adenocarcinoma of the distal rectum. Dr. Lisbeth Renshaw discusses the pathology findings and reviews the nature of locally advanced adenocarcinoma of the rectum.  We discussed the utility of chemoradiation in the adjuvant setting.  He wants to move  his appointment with Dr. Burr Medico.   We discussed the risks, benefits, short, and long term effects of radiotherapy, as well as the curative intent, and the patient is not interested in proceeding with any type of treatment. Dr. Lisbeth Renshaw discusses the delivery and logistics of radiotherapy and anticipates a course of 5 1/2 weeks of radiotherapy and we tried to answer concerns the patient has about the course other friends and family have experienced and that every patient's course may be different, however he was not willing to proceed further. We will follow along with his progress, but at this time will await further decision making after meeting with Dr. Burr Medico, and Dr. Johney Maine.   Given current concerns for patient exposure during the COVID-19 pandemic, this encounter was conducted via telephone.  The patient has provided two factor identification and has given verbal consent for this type of encounter and has been advised to only accept a meeting of this type in a secure network environment. The time spent during this encounter was 45 minutes including preparation, discussion, and coordination of the patient's care. The attendants for this meeting include Blenda Nicely, RN, Dr. Lisbeth Renshaw, Hayden Pedro  and [name] and his wife Ryan Mosley.  During the encounter,  Blenda Nicely, RN, Dr. Lisbeth Renshaw, and Hayden Pedro were located at [facility] Radiation Oncology Department.  Ryan Mosley was located at home with his wife Ryan Mosley.   The above documentation reflects my direct findings during this shared patient visit. Please see the separate note by Dr. Lisbeth Renshaw on this date for the remainder of the patient's plan of care.    Carola Rhine, [facility]   **Disclaimer: This note was dictated with voice recognition software. Similar sounding words can inadvertently be transcribed and this note may contain transcription errors which may not have been corrected upon publication of  note.**

## 2020-10-09 ENCOUNTER — Inpatient Hospital Stay: Payer: Medicare Other | Attending: Hematology | Admitting: Hematology

## 2020-10-09 NOTE — Progress Notes (Signed)
I left a vm explaining who I am and that Ryan Mosley missed an appt with Dr Burr Medico.  I left my number and asked for a return call.

## 2020-10-10 ENCOUNTER — Telehealth: Payer: Self-pay | Admitting: Hematology

## 2020-10-10 NOTE — Telephone Encounter (Signed)
I cld and lft a vm Ryan Mosley to reschedule his new pt appt

## 2020-10-10 NOTE — Progress Notes (Signed)
I spoke with Ryan Mosley regarding the missed appt yesterday.  She states Mr Agosto called and cancelled the appt.  I asked if I could reschedule she said she would not be able to do that for him.  She was not sure when he would be home today.  I told her I would call back later to speak with him.

## 2020-10-13 NOTE — Progress Notes (Signed)
I left a vm for Mr Ryan Mosley requesting  a return call to reschedule his appt with Dr Burr Medico that he missed last week.  I provided my direct phone number

## 2020-10-14 ENCOUNTER — Ambulatory Visit: Payer: Medicare Other | Admitting: Radiation Oncology

## 2020-10-14 NOTE — Progress Notes (Signed)
I spoke with Mr Lenox.  I have rescheduled him for his consult with Cira Rue and Dr Burr Medico for 9/1 at 1100.

## 2020-10-15 ENCOUNTER — Other Ambulatory Visit: Payer: Self-pay

## 2020-10-15 NOTE — Progress Notes (Signed)
The proposed treatment discussed in conference is for discussion purpose only and is not a binding recommendation.  The patients have not been physically examined, or presented with their treatment options.  Therefore, final treatment plans cannot be decided.  

## 2020-10-23 ENCOUNTER — Inpatient Hospital Stay: Payer: Medicare Other | Attending: Nurse Practitioner | Admitting: Nurse Practitioner

## 2020-10-23 ENCOUNTER — Encounter: Payer: Self-pay | Admitting: Nurse Practitioner

## 2020-10-23 ENCOUNTER — Inpatient Hospital Stay: Payer: Medicare Other

## 2020-10-23 ENCOUNTER — Other Ambulatory Visit: Payer: Self-pay

## 2020-10-23 VITALS — BP 149/87 | HR 83 | Resp 18 | Wt 158.3 lb

## 2020-10-23 DIAGNOSIS — D500 Iron deficiency anemia secondary to blood loss (chronic): Secondary | ICD-10-CM | POA: Diagnosis not present

## 2020-10-23 DIAGNOSIS — C20 Malignant neoplasm of rectum: Secondary | ICD-10-CM | POA: Insufficient documentation

## 2020-10-23 LAB — CMP (CANCER CENTER ONLY)
ALT: 13 U/L (ref 0–44)
AST: 16 U/L (ref 15–41)
Albumin: 4.4 g/dL (ref 3.5–5.0)
Alkaline Phosphatase: 79 U/L (ref 38–126)
Anion gap: 10 (ref 5–15)
BUN: 10 mg/dL (ref 8–23)
CO2: 24 mmol/L (ref 22–32)
Calcium: 9.9 mg/dL (ref 8.9–10.3)
Chloride: 107 mmol/L (ref 98–111)
Creatinine: 0.88 mg/dL (ref 0.61–1.24)
GFR, Estimated: >60 mL/min (ref >60)
Glucose, Bld: 105 mg/dL — ABNORMAL HIGH (ref 70–99)
Potassium: 4.5 mmol/L (ref 3.5–5.1)
Sodium: 141 mmol/L (ref 135–145)
Total Bilirubin: <0.2 mg/dL — ABNORMAL LOW (ref 0.3–1.2)
Total Protein: 8.7 g/dL — ABNORMAL HIGH (ref 6.5–8.1)

## 2020-10-23 LAB — CEA (IN HOUSE-CHCC): CEA (CHCC-In House): <1 ng/mL (ref 0.00–5.00)

## 2020-10-23 LAB — CBC WITH DIFFERENTIAL (CANCER CENTER ONLY)
Abs Immature Granulocytes: 0.01 10*3/uL (ref 0.00–0.07)
Basophils Absolute: 0 10*3/uL (ref 0.0–0.1)
Basophils Relative: 0 %
Eosinophils Absolute: 0.1 10*3/uL (ref 0.0–0.5)
Eosinophils Relative: 2 %
HCT: 37.5 % — ABNORMAL LOW (ref 39.0–52.0)
Hemoglobin: 11.5 g/dL — ABNORMAL LOW (ref 13.0–17.0)
Immature Granulocytes: 0 %
Lymphocytes Relative: 25 %
Lymphs Abs: 1.7 10*3/uL (ref 0.7–4.0)
MCH: 25.7 pg — ABNORMAL LOW (ref 26.0–34.0)
MCHC: 30.7 g/dL (ref 30.0–36.0)
MCV: 83.9 fL (ref 80.0–100.0)
Monocytes Absolute: 0.7 10*3/uL (ref 0.1–1.0)
Monocytes Relative: 10 %
Neutro Abs: 4.2 10*3/uL (ref 1.7–7.7)
Neutrophils Relative %: 63 %
Platelet Count: 261 10*3/uL (ref 150–400)
RBC: 4.47 MIL/uL (ref 4.22–5.81)
RDW: 14.8 % (ref 11.5–15.5)
WBC Count: 6.8 10*3/uL (ref 4.0–10.5)
nRBC: 0 % (ref 0.0–0.2)

## 2020-10-23 NOTE — Progress Notes (Signed)
I met with Mr and Mrs Rosete after Mr Bishop's consult with dr Burr Medico.  I reviewed my role as GI Oncology Nurse Navigator.  I provided my direct contact information.  I provided written materials on Xeloda and highlighted the more common side effects.  I also told them that if he decides to start Xeloda and then changes his mind that is fine.  We are just here to help him during this time.  Questions were answered and the verbalized understanding.  I encouraged them to call with any questions or concerns.

## 2020-10-23 NOTE — Progress Notes (Addendum)
[place]   Telephone:([phone]) [phone] Fax:([phone]) [place] Note   Patient Care Team: Susy Frizzle, MD as PCP - General (Family Medicine) Edythe Clarity, [facility] as Pharmacist (Pharmacist) Truitt Merle, MD as Consulting Physician (Hematology) Michael Boston, MD as Consulting Physician (General Surgery) Thornton Park, MD as Consulting Physician (Gastroenterology) Kyung Rudd, MD as Consulting Physician (Radiation Oncology) 10/23/2020  CHIEF COMPLAINTS/PURPOSE OF CONSULTATION:  Rectal cancer, referred by Dr. Remo Lipps gross  Oncology History  Rectal carcinoma ([place])  07/07/2020 Imaging   CTA abdomen/pelvis IMPRESSION: VASCULAR No evidence of acute gastrointestinal hemorrhage, as queried. Aortic Atherosclerosis (ICD10-I70.0). NON-VASCULAR 1. No acute abdominopelvic abnormality. 2. Prostatomegaly.   07/08/2020 Initial Diagnosis   Rectal carcinoma ([place])   07/08/2020 Procedure   Colonoscopy by Dr. Tarri Glenn -Findings The digital rectal exam revealed a rubbery-textured and soft rectal mass. A few small-mouthed diverticula were found in the sigmoid colon. A frond-like/villous non-obstructing large mass was found in the rectum, at least 1 cm from the anal verge. The mass involves approximately 20-25% of the circumference. The mass measured at least 4 cm in length. Oozing was present. Multiple biopsies were obtained with a cold forceps for histology.   07/08/2020 Initial Biopsy   FINAL MICROSCOPIC DIAGNOSIS:  A. RECTUM, BIOPSY:  - Fragments of tubular adenoma with foci of high-grade glandular dysplasia.    07/08/2020 Imaging   CT chest IMPRESSION: 1. 1.4 by 1.1 cm sub solid nodule in the superior segment left lower lobe. This is not typical for metastatic disease but could be inflammatory or conceivably low-grade adenocarcinoma. There is also a 3 mm left lower lobe nodule. Initial follow-up with CT at 6-12 months is recommended to confirm persistence.  If persistent, repeat CT is recommended every 2 years until 5 years of stability has been established. This recommendation follows the consensus statement: Guidelines for Management of Incidental Pulmonary Nodules Detected on CT Images: From the Fleischner Society 2017; Radiology 2017; 284:228-243. 2. Low-density blood pool compatible with anemia. 3.  Aortic Atherosclerosis (ICD10-I70.0). 4.  Emphysema (ICD10-J43.9). 5. Thoracic spondylosis.   07/18/2020 Procedure   Flex sigmoid by Dr. Tarri Glenn - Polypoid lesion in the distal rectum. No ammenable to endoscopic resection. Biopsied. - Diverticulosis in the sigmoid colon.   07/18/2020 Pathology Results   Surgical [P], rectal mass - TUBULOVILLOUS ADENOMA(S) WITH HIGH-GRADE DYSPLASIA, FOCALLY SUSPICIOUS FOR INVASIVE CARCINOMA.   09/25/2020 Cancer Staging   Staging form: Colon and Rectum, AJCC 8th Edition - Pathologic stage from 09/25/2020: Stage IIA (pT3, pN0, cM0) - Signed by Alla Feeling, NP on 10/23/2020 Stage prefix: Initial diagnosis Total positive nodes: 0 Total nodes examined: 2 Histologic grading system: 4 grade system Histologic grade (G): G2   09/25/2020 Definitive Surgery   Robotic converted to transanal partial proctectomy of distal rectal mass by Dr. Johney Maine   09/25/2020 Pathology Results   FINAL MICROSCOPIC DIAGNOSIS: A. 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 - Tumor involves deep edge of fragments. B. 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 - Tumor invades through muscularis propria into pericolorectal tissue. - Margins are negative, see comment. - Two of two lymph nodes negative for carcinoma (0/2). - See oncology table. C. ANTERIOR DEEP MARGIN: - Benign fibroadipose tissue.  pT3, pN0 MMR normal MSI-stable      HISTORY OF PRESENTING  ILLNESS:  Seward Coran 80 y.o. male with PMH including HL  and HTN is here because of newly diagnosed rectal cancer.  He presented to ED 07/07/2020 after an episode of BRBPR and weakness at home.  Work-up showed hemoglobin 7.9 and positive FOBT, s/p 2 units blood transfusion and 1 dose IV iron (Ferrlecit).  Records show 15 pounds weight loss since 02/2020. CT a abdomen/pelvis showed no acute abdominopelvic abnormality.  Colonoscopy on admission by Dr. Tarri Glenn 07/08/2020 showed a palpable soft rectal mass on digital rectal exam.  A frond-like villous nonobstructing large mass was found in the rectum 1 cm from the anal verge, involving approximately 20-25% circumference and measuring at least 4 cm in length.  Path showed fragments of tubular adenoma with foci of high-grade glandular dysplasia.  Baseline CEA 07/09/2020 normal 1.4.  CT chest 07/08/2020 showed a simple appearing cyst in liver segment 4 measuring up to 3.7 cm, 2 small left lower lobe nodules, and emphysema. This will warrant f/up imaging. He underwent outpatient flex sig by Dr. Tarri Glenn 07/18/2020 which showed polypoid lesion in the distal rectum not amenable to endoscopic resection and was biopsied, path again showed tubulovillous adenoma with high-grade dysplasia with a focal area suspicious for invasive carcinoma.  The patient canceled multiple appointments with GI, surgery, and oncology.  He developed recurrent rectal bleeding and presented back to ED for hospital admission 08/14/2020-08/16/2020.  Eventually agreed to surgery, taken to the OR by Dr. Johney Maine on 09/25/2020 for transanal partial proctectomy of rectal mass, surgical path showed invasive moderately differentiated adenocarcinoma measuring at least 3 cm arising from a tubulovillous adenoma with high-grade dysplasia invading through muscularis propria into pericolorectal tissue.  Margins are negative, 2 of 2 lymph nodes are negative.  There was no perforation, LVI, or PNI.  This was staged PT3PN0. There  was preserved expression of the major mismatch repair proteins, tumor is MSI-stable.  His postoperative course was uneventful.  He was seen by radiation oncology PA Shona Simpson and Dr. Lisbeth Renshaw but has not yet agreed to adjuvant radiation.  Socially, he is married, lives with his wife.  He has 4 adult healthy children.  He is a retired Psychologist, sport and exercise, has had no prior colonoscopies.  He smoked less than 1 pack/day cigarettes for many years, switched to chewing tobacco 30-40 years ago and currently chews.  He drank beer on the weekends and his "younger days."  Denies drug use.  He is independent with all ADLs and active, mows his yard and does all his own activities.  He had a cousin with throat cancer, brother with cancer in his leg, and 2 sisters with breast cancer.  Today he presents with his wife.  He has recovered well from surgery.  He was constipated with BM every 1-2 days now daily. Still has some leakage and not always aware of the urge to defecate. He has some blood and burning with wiping, denies other pain.  He is gaining weight slowly, only recently started to eat better.  He has not taken oral iron recently but cannot remember what side effects he had.    MEDICAL HISTORY:  Past Medical History:  Diagnosis Date   Acute blood loss anemia    on meds   Blood transfusion without reported diagnosis 2022   Cataract    bilateral sx   Diabetes mellitus without complication ([place])    not on meds at this time   GERD (gastroesophageal reflux disease)    hx of   GI bleed    Hyperlipidemia    on meds   Hypertension  on meds   Meningitis 1960/1970   Pneumonia 1960/1970   Rectal polyp     SURGICAL HISTORY: Past Surgical History:  Procedure Laterality Date   BIOPSY  07/08/2020   Procedure: BIOPSY;  Surgeon: Thornton Park, MD;  Location: [place];  Service: Gastroenterology;;   COLONOSCOPY WITH PROPOFOL N/A 07/08/2020   Procedure: COLONOSCOPY WITH PROPOFOL;  Surgeon: Thornton Park,  MD;  Location: [place];  Service: Gastroenterology;  Laterality: N/A;   DENTAL SURGERY     all teeth removed   XI ROBOT ASSISTED TRANSANAL RESECTION N/A 09/25/2020   Procedure: TAMIS PARTIAL PROCTECTOMY OF RECTAL MASS;  Surgeon: Michael Boston, MD;  Location: [facility];  Service: General;  Laterality: N/A;    SOCIAL HISTORY: Social History   Socioeconomic History   Marital status: Married    Spouse name: Not on file   Number of children: Not on file   Years of education: Not on file   Highest education level: Not on file  Occupational History   Not on file  Tobacco Use   Smoking status: Former   Smokeless tobacco: Current    Types: Chew  Vaping Use   Vaping Use: Never used  Substance and Sexual Activity   Alcohol use: Not Currently    Alcohol/week: 1.0 standard drink    Types: 1 Standard drinks or equivalent per week   Drug use: Never   Sexual activity: Not on file  Other Topics Concern   Not on file  Social History Narrative   Not on file   Social Determinants of Health   Financial Resource Strain: Not on file  Food Insecurity: Not on file  Transportation Needs: Not on file  Physical Activity: Not on file  Stress: Not on file  Social Connections: Not on file  Intimate Partner Violence: Not on file    FAMILY HISTORY: Family History  Problem Relation Age of Onset   Seizures Daughter    Diabetes Son    Colon polyps Neg Hx    Colon cancer Neg Hx    Esophageal cancer Neg Hx    Rectal cancer Neg Hx    Stomach cancer Neg Hx     ALLERGIES:  has No Known Allergies.  MEDICATIONS:  Current Outpatient Medications  Medication Sig Dispense Refill   acetaminophen (TYLENOL) 325 MG tablet Take 650 mg by mouth daily.     amLODipine (NORVASC) 5 MG tablet TAKE 1 TABLET BY MOUTH EVERY DAY FOR BLOOD PRESSURE (Patient taking differently: Take 5 mg by mouth daily. BLOOD PRESSURE) 90 tablet 2   ferrous sulfate 325 (65 FE) MG EC tablet Take 1 tablet (325 mg total) by mouth 2  (two) times daily. (Patient taking differently: Take 325 mg by mouth every other day.) 60 tablet 3   latanoprost (XALATAN) 0.005 % ophthalmic solution Place 1 drop into both eyes at bedtime.  3   oxyCODONE (OXY IR/ROXICODONE) 5 MG immediate release tablet Take 1-2 tablets (5-10 mg total) by mouth every 6 (six) hours as needed for moderate pain, severe pain or breakthrough pain. 30 tablet 0   Pseudoephedrine-Ibuprofen (ADVIL COLD/SINUS PO) Take 1 tablet by mouth daily as needed (sinus relief).     rosuvastatin (CRESTOR) 5 MG tablet TAKE 1 TABLET BY MOUTH ONCE A WEEK FOR CHOLESTEROL (Patient taking differently: Take 5 mg by mouth once a week. FOR CHOLESTEROL) 12 tablet 2   No current facility-administered medications for this visit.    REVIEW OF SYSTEMS:   Constitutional: Denies fevers, chills or  abnormal night sweats (+) weight loss Eyes: Denies blurriness of vision, double vision or watery eyes Ears, nose, mouth, throat, and face: Denies mucositis or sore throat Respiratory: Denies cough, dyspnea or wheezes Cardiovascular: Denies palpitation, chest discomfort or lower extremity swelling Gastrointestinal:  Denies nausea, vomiting, constipation, diarrhea, heartburn or change in bowel habits (+) blood and burning with wiping after BM (+) leakage Skin: Denies abnormal skin rashes Lymphatics: Denies new lymphadenopathy or easy bruising Neurological:Denies numbness, tingling or new weaknesses Behavioral/Psych: Mood is stable, no new changes  All other systems were reviewed with the patient and are negative.  PHYSICAL EXAMINATION: ECOG PERFORMANCE STATUS: 1 - Symptomatic but completely ambulatory  Vitals:   10/23/20 1055  BP: (!) 149/87  Pulse: 83  Resp: 18  SpO2: 100%   Filed Weights   10/23/20 1055  Weight: 158 lb 5 oz (71.8 kg)    GENERAL:alert, no distress and comfortable SKIN: No rash EYES: sclera clear NECK: Without mass LYMPH:  no palpable cervical or supraclavicular  lymphadenopathy LUNGS: clear with normal breathing effort HEART: regular rate & rhythm, no lower extremity edema ABDOMEN: abdomen soft, non-tender and normal bowel sounds RECTAL: exam deferred due to recent exam by surgeon PSYCH: alert & oriented x 3 with fluent speech NEURO: no focal motor/sensory deficits  LABORATORY DATA:  I have reviewed the data as listed CBC Latest Ref Rng & Units 10/23/2020 09/26/2020 09/16/2020  WBC 4.0 - 10.5 K/uL 6.8 24.7(H) 7.0  Hemoglobin 13.0 - 17.0 g/dL 11.5(L) 10.6(L) 11.1(L)  Hematocrit 39.0 - 52.0 % 37.5(L) 35.5(L) 37.1(L)  Platelets 150 - 400 K/uL 261 294 300   CMP Latest Ref Rng & Units 10/23/2020 09/26/2020 09/16/2020  Glucose 70 - 99 mg/dL 105(H) 160(H) 113(H)  BUN 8 - 23 mg/dL _0 Creatinine 0.61 - 1.24 mg/dL 0.88 1.16 0.85  Sodium 135 - 145 mmol/L 141 136 139  Potassium 3.5 - 5.1 mmol/L 4.5 3.7 4.1  Chloride 98 - 111 mmol/L 107 107 105  CO2 22 - 32 mmol/L 24 20(L) 27  Calcium 8.9 - 10.3 mg/dL 9.9 8.8(L) 9.2  Total Protein 6.5 - 8.1 g/dL 8.7(H) - -  Total Bilirubin 0.3 - 1.2 mg/dL <0.2(L) - -  Alkaline Phos 38 - 126 U/L 79 - -  AST 15 - 41 U/L 16 - -  ALT 0 - 44 U/L 13 - -     RADIOGRAPHIC STUDIES: I have personally reviewed the radiological images as listed and agreed with the findings in the report. No results found.  ASSESSMENT & PLAN: 80 yo male   1.  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  Staging work-up showed lung nodules and a liver cyst, negative for metastatic disease.  Baseline CEA normal 1.4 -He underwent transanal resection by Dr. Johney Maine on 09/25/2020, we reviewed the surgical path in detail.  He had T3N0 disease  with clear margins, 0/2 LN+, no high risk features such as perforation, PNI, or LVI.  We discussed his cancer was removed in surgery, any adjuvant treatment is preventative -We discussed stage II cancer has a low to moderate risk of recurrence, around 20%. -To reduce the risk of local recurrence he met with Dr. Lisbeth Renshaw who recommends adjuvant radiation.  Patient is  reluctant due to concern for side effects and transportation, he lives in [place], he can drive himself -Patient was seen with Dr. Burr Medico who recommends systemic chemo to reduce the risk of distant recurrence. We discussed standard FOLFOX in a younger person, but in his age and due to fear of side effects we recommend chemoradiation with Xeloda twice daily M-F on days with radiation followed by additional 3-6 months of Xeloda on days 1-14 q. 28 days -Chemotherapy consent: Side effects including but not limited to fatigue, decreased appetite/taste change, mucositis, nausea, vomiting, diarrhea, and-foot syndrome, renal/kidney dysfunction, and hematologic toxicities including possible neutropenia, anemia, and thrombocytopenia were discussed with patient in great detail.  He seems more likely to except Xeloda then radiation but he is still considering.  He was given written material on it today.  I will call next week to follow-up on his decision.   -The treatment goal of adjuvant chemo is curative. -He is 1 month postop, recovering well with good performance status and minimal pain.  Bowels moving normally with some leakage.  Next follow-up with Dr. Johney Maine 11/20/2020. -We reviewed the optimal treatment window is to begin 6-8 weeks postop for maximum benefit. -Follow-up pending his decision for adjuvant treatment, if he elects to proceed, follow-up with start of chemo RT. -If he declines adjuvant treatment we will proceed with close follow-up.  2.  Symptomatic anemia from GI bleed, secondary to #1 -Hg 7.9 at initial ED presentation 06/2020 -S/p 2  units RBCs and 1 dose of Ferrlecit on 07/07/2020 -He was instructed by GI Dr. Tarri Glenn to take oral iron every other day, he has not done so but does not recall the side effects -Anemia is improving, Hgb 11.5 today, iron studies are pending. -Resume oral iron once daily, will arrange additional IV iron if needed  Plan: -Lab today -Resume oral iron once daily, will arrange IV iron if needed -Recommendation is to proceed with chemoRT with Xeloda starting in next 2-3 weeks, patient still considering -I will f/up next week on his decision -F/up pending treatment decision    Orders Placed This Encounter  Procedures   CBC with Differential ([place] Only)    Standing Status:   Standing    Number of Occurrences:   20    Standing Expiration Date:   10/23/2021   CMP ([place] only)    Standing Status:   Standing    Number of Occurrences:   20    Standing Expiration Date:   10/23/2021   Ferritin    Standing Status:   Standing    Number of Occurrences:   20    Standing Expiration Date:   10/23/2021   Iron and TIBC    Standing Status:   Standing    Number of Occurrences:   20    Standing Expiration Date:   10/23/2021   CEA (IN HOUSE-[facility])FOR [facility] ONLY    Standing Status:   Standing    Number of Occurrences:   20    Standing Expiration Date:   10/23/2021     All questions were answered. The patient knows to call the clinic with any problems, questions or concerns.     Alla Feeling, NP 10/23/2020 1:32 PM  Addendum  I have reviewed the above documentation for accuracy and completeness, and I agree with the above.  80 year old African-American male, with past medical history of hypertension and diabetes, was referred by colorectal surgeon Dr. Johney Maine for his newly diagnosed distal rectal cancer.  He underwent proctectomy about a month ago.  I reviewed his surgical pathology findings with the patient and his wife in detail.  He had T3N0 disease, with 2 negative lymph nodes.   According to NCCN guideline, the standard adjuvant therapy is chemotherapy with FOLFOX or [place] ox, plus concurrent chemoradiation.  Due to his advanced age, and he is reluctant to take any chemo or radiation, I discussed the alternative Xeloda for 3-6 month including concurrent radiation and Xeloda for 5 to 6 weeks.  I explained to him and his wife that this is very tolerable regiment, and I encouraged him to consider.  Patient seemed to be open to Xeloda, probably 3 months, but very hesitant to take radiation.  He will think about his options.  We will follow-up with him next week about his decision.  We also reviewed her surveillance plan, he agrees to follow-up with [place].  Truitt Merle 10/23/2020

## 2020-10-24 LAB — IRON AND TIBC
Iron: 55 ug/dL (ref 42–163)
Saturation Ratios: 13 % — ABNORMAL LOW (ref 20–55)
TIBC: 423 ug/dL — ABNORMAL HIGH (ref 202–409)
UIBC: 368 ug/dL (ref 117–376)

## 2020-10-24 LAB — FERRITIN: Ferritin: 11 ng/mL — ABNORMAL LOW (ref 24–336)

## 2020-10-28 ENCOUNTER — Ambulatory Visit: Payer: Medicare Other | Admitting: Radiation Oncology

## 2020-10-29 ENCOUNTER — Other Ambulatory Visit: Payer: Self-pay

## 2020-10-29 ENCOUNTER — Telehealth: Payer: Self-pay | Admitting: Nurse Practitioner

## 2020-10-29 NOTE — Telephone Encounter (Signed)
I called Mr. Mehner 9/6 and 9/7 to follow up on his treatment decision and answer any additional questions. No answer x2. I left messages to call back and provided main #. Med/rad onc aware.   Cira Rue, NP

## 2020-10-29 NOTE — Progress Notes (Signed)
The proposed treatment discussed in conference is for discussion purpose only and is not a binding recommendation.  The patients have not been physically examined, or presented with their treatment options.  Therefore, final treatment plans cannot be decided.  

## 2020-10-30 NOTE — Progress Notes (Signed)
I left a message for Ryan Mosley stating the reason for my call and requesting call back.  I provided my direct phone number.

## 2020-10-31 NOTE — Progress Notes (Signed)
I spoke with Ryan Mosley.  He states he has not decided on treatment yet.  He states he will call use when he has decided.

## 2020-11-02 ENCOUNTER — Telehealth: Payer: Self-pay | Admitting: Gastroenterology

## 2020-11-02 NOTE — Telephone Encounter (Signed)
Records review of [place] show invasive adenocarcinoma arising in a tubulovillous adenoma with high grade dysplasia with invasion through the muscularis propria. Two lymph nodes negative for carcinoma.

## 2020-11-06 ENCOUNTER — Telehealth: Payer: Self-pay | Admitting: Nurse Practitioner

## 2020-11-06 NOTE — Telephone Encounter (Signed)
I called [name] to follow up on treatment decision. He was out and I spoke to his wife. She tells me he has not made a decision yet. I reminded her we are in the treatment window to start xeloda after surgery for optimal benefit (6 weeks post op today). I also let her know he may benefit from IV iron if he is interested. She is not sure. I recommend to start back on oral iron once daily. I requested he call us next week with decision. She will pass on my message.   Cira Rue, NP  11/06/2020

## 2020-11-13 ENCOUNTER — Telehealth: Payer: Self-pay | Admitting: Pharmacist

## 2020-11-13 NOTE — Progress Notes (Addendum)
Chronic Care Management Pharmacy Assistant   Name: Ryan Mosley  MRN: [id number] DOB: 1940-06-16  Reason for Encounter: Disease State For DM.    Conditions to be addressed/monitored: hypertension, allergic rhinitis, hyperlipidemia, diabetes mellitus.  Recent office visits:  07/17/20 Dr. Dennard Schaumann For hospital Follow-up. No medication changes.   Recent consult visits:  10/23/20 Oncology Alla Feeling, NP. For rectal carcinoma. No medication changes.  10/20/20 General Surgery Gross, Adrian Saran, MD For post operative visit. No medication changes.  09/10/20 Joneen Caraway, MD. For rectal mass. No medication changes.  09/01/20 Ophthalmology Hyman Hopes, MD  For glaucoma Evaluation. No medication changes.  07/18/20 [facility]. No information given.   Hospital visits:  09/25/20 [facility] ( [facility]) Michael Boston, MD. For tamis partial proctectomy of rectal mass. No medication changes.  08/14/20 [facility] (2 days) Leslee Home, DO. For Acute GI Bleeding. STOPPED Tamsulosin 0.4 mg. 07/07/20 [facility] (2 days) Jonetta Osgood, MD For GI Bleed. Per note:Patient had a colonoscopy. STOPPED Lisinopril.   Medications: Outpatient Encounter Medications as of 11/13/2020  Medication Sig   acetaminophen (TYLENOL) 325 MG tablet Take 650 mg by mouth daily.   amLODipine (NORVASC) 5 MG tablet TAKE 1 TABLET BY MOUTH EVERY DAY FOR BLOOD PRESSURE (Patient taking differently: Take 5 mg by mouth daily. BLOOD PRESSURE)   ferrous sulfate 325 (65 FE) MG EC tablet Take 1 tablet (325 mg total) by mouth 2 (two) times daily. (Patient taking differently: Take 325 mg by mouth every other day.)   latanoprost (XALATAN) 0.005 % ophthalmic solution Place 1 drop into both eyes at bedtime.   oxyCODONE (OXY IR/ROXICODONE) 5 MG immediate release tablet Take 1-2 tablets (5-10 mg total) by mouth every 6 (six) hours as needed for  moderate pain, severe pain or breakthrough pain.   Pseudoephedrine-Ibuprofen (ADVIL COLD/SINUS PO) Take 1 tablet by mouth daily as needed (sinus relief).   rosuvastatin (CRESTOR) 5 MG tablet TAKE 1 TABLET BY MOUTH ONCE A WEEK FOR CHOLESTEROL (Patient taking differently: Take 5 mg by mouth once a week. FOR CHOLESTEROL)   No facility-administered encounter medications on file as of 11/13/2020.   Recent Relevant Labs: Lab Results  Component Value Date/Time   HGBA1C 6.0 (H) 09/25/2020 06:57 PM   HGBA1C 5.3 08/15/2020 02:20 AM   MICROALBUR 26.4 09/17/2019 08:50 AM    Kidney Function Lab Results  Component Value Date/Time   CREATININE 0.88 10/23/2020 12:54 PM   CREATININE 1.16 09/26/2020 04:37 AM   CREATININE 0.85 09/16/2020 09:14 AM   CREATININE 0.92 07/17/2020 11:38 AM   CREATININE 0.83 03/17/2020 08:42 AM   GFRNONAA >60 10/23/2020 12:54 PM   GFRNONAA 79 07/17/2020 11:38 AM   GFRAA 91 07/17/2020 11:38 AM    Current antihyperglycemic regimen:  None.   What recent interventions/DTPs have been made to improve glycemic control:  None.   Have there been any recent hospitalizations or ED visits since last visit with CPP? Yes, Documented above.   Adherence Review: Is the patient currently on a STATIN medication? Rosuvastatin 5 mg  Is the patient currently on ACE/ARB medication? N/A.   Does the patient have >5 day gap between last estimated fill dates? Per misc rpts, no.   Third unsuccessful telephone outreach was attempted today. The patient was referred to the pharmacist for assistance with care management and care coordination.   Care Gaps:Patient blood pressure is 160/86  Star Rating Drugs: Rosuvastatin 5 mg  10/30/20 90 DS.   Follow-Up:Pharmacist Review  Charlann Lange, [place] Pharmacist Assistant [phone]

## 2020-11-18 ENCOUNTER — Encounter: Payer: Self-pay | Admitting: Family Medicine

## 2020-11-18 DIAGNOSIS — C20 Malignant neoplasm of rectum: Secondary | ICD-10-CM

## 2020-11-19 ENCOUNTER — Telehealth: Payer: Self-pay

## 2020-11-19 NOTE — Telephone Encounter (Signed)
I spoke with Mrs and Mr Glick.  Mr Vorhees is declining radiation and chemotherapy treatments at this time.  I told them if he should change his mind to reach out to [place].  They verbalized understanding.

## 2020-11-20 ENCOUNTER — Ambulatory Visit (INDEPENDENT_AMBULATORY_CARE_PROVIDER_SITE_OTHER): Payer: Medicare Other | Admitting: Family Medicine

## 2020-11-20 ENCOUNTER — Other Ambulatory Visit: Payer: Self-pay

## 2020-11-20 VITALS — BP 150/70 | HR 83 | Temp 97.0°F | Ht 69.0 in | Wt 163.0 lb

## 2020-11-20 DIAGNOSIS — R911 Solitary pulmonary nodule: Secondary | ICD-10-CM | POA: Diagnosis not present

## 2020-11-20 NOTE — Progress Notes (Signed)
Subjective:    Patient ID: Ryan Mosley, male    DOB: 1940/08/04, 80 y.o.   MRN: [id number]  HPI Patient was admitted to the hospital in May with a GI bleed.  Colonoscopy revealed rectal cancer.  He recently underwent resection for the rectal cancer.  Staging was T3N0.  Margins were clear on the biopsy and lymph nodes were negative.  However during his hospitalization a CT scan of the chest showed a mass in the left lung.  Results are included below: IMPRESSION: 1. 1.4 by 1.1 cm sub solid nodule in the superior segment left lower lobe. This is not typical for metastatic disease but could be inflammatory or conceivably low-grade adenocarcinoma. There is also a 3 mm left lower lobe nodule. Initial follow-up with CT at 6-12 months is recommended to confirm persistence. If persistent, repeat CT is recommended every 2 years until 5 years of stability has been established. This recommendation follows the consensus statement: Guidelines for Management of Incidental Pulmonary Nodules Detected on CT Images: From the Fleischner Society 2017; Radiology 2017; 284:228-243. 2. Low-density blood pool compatible with anemia. 3.  Aortic Atherosclerosis (ICD10-I70.0). 4.  Emphysema (ICD10-J43.9). 5. Thoracic spondylosis.  Patient is here today to discuss this.  I recommended that we perform either a PET scan or a CT scan or consult pulmonology regarding the mass in his lung.  The patient states that he is almost 80 years old and he does not want to undergo any more work-up.  He wants to wait for now.  I explained to him that if we wait and this is cancer, it could likely grow and spread and then be incurable when he does want to pursue it further.  The patient states that he understands this however he has no desire for any additional testing. Past Medical History:  Diagnosis Date   Acute blood loss anemia    on meds   Blood transfusion without reported diagnosis 2022   Cataract    bilateral sx    Diabetes mellitus without complication ([place])    not on meds at this time   GERD (gastroesophageal reflux disease)    hx of   GI bleed    Hyperlipidemia    on meds   Hypertension    on meds   Meningitis 1960/1970   Pneumonia 1960/1970   Rectal polyp    Past Surgical History:  Procedure Laterality Date   BIOPSY  07/08/2020   Procedure: BIOPSY;  Surgeon: Thornton Park, MD;  Location: [facility];  Service: Gastroenterology;;   COLONOSCOPY WITH PROPOFOL N/A 07/08/2020   Procedure: COLONOSCOPY WITH PROPOFOL;  Surgeon: Thornton Park, MD;  Location: [facility];  Service: Gastroenterology;  Laterality: N/A;   DENTAL SURGERY     all teeth removed   XI ROBOT ASSISTED TRANSANAL RESECTION N/A 09/25/2020   Procedure: TAMIS PARTIAL PROCTECTOMY OF RECTAL MASS;  Surgeon: Michael Boston, MD;  Location: [facility];  Service: General;  Laterality: N/A;   Current Outpatient Medications on File Prior to Visit  Medication Sig Dispense Refill   amLODipine (NORVASC) 5 MG tablet TAKE 1 TABLET BY MOUTH EVERY DAY FOR BLOOD PRESSURE (Patient taking differently: Take 5 mg by mouth daily. BLOOD PRESSURE) 90 tablet 2   ferrous sulfate 325 (65 FE) MG EC tablet Take 1 tablet (325 mg total) by mouth 2 (two) times daily. (Patient taking differently: Take 325 mg by mouth every other day.) 60 tablet 3   Pseudoephedrine-Ibuprofen (ADVIL COLD/SINUS PO) Take 1 tablet by mouth daily  as needed (sinus relief).     rosuvastatin (CRESTOR) 5 MG tablet TAKE 1 TABLET BY MOUTH ONCE A WEEK FOR CHOLESTEROL (Patient taking differently: Take 5 mg by mouth once a week. FOR CHOLESTEROL) 12 tablet 2   acetaminophen (TYLENOL) 325 MG tablet Take 650 mg by mouth daily.     latanoprost (XALATAN) 0.005 % ophthalmic solution Place 1 drop into both eyes at bedtime.  3   oxyCODONE (OXY IR/ROXICODONE) 5 MG immediate release tablet Take 1-2 tablets (5-10 mg total) by mouth every 6 (six) hours as needed for moderate pain, severe pain or  breakthrough pain. (Patient not taking: Reported on 11/20/2020) 30 tablet 0   No current facility-administered medications on file prior to visit.   No Known Allergies Social History   Socioeconomic History   Marital status: Married    Spouse name: Not on file   Number of children: Not on file   Years of education: Not on file   Highest education level: Not on file  Occupational History   Not on file  Tobacco Use   Smoking status: Former   Smokeless tobacco: Current    Types: Chew  Vaping Use   Vaping Use: Never used  Substance and Sexual Activity   Alcohol use: Not Currently    Alcohol/week: 1.0 standard drink    Types: 1 Standard drinks or equivalent per week   Drug use: Never   Sexual activity: Not on file  Other Topics Concern   Not on file  Social History Narrative   Not on file   Social Determinants of Health   Financial Resource Strain: Not on file  Food Insecurity: Not on file  Transportation Needs: Not on file  Physical Activity: Not on file  Stress: Not on file  Social Connections: Not on file  Intimate Partner Violence: Not on file     Review of Systems  All other systems reviewed and are negative.     Objective:   Physical Exam Vitals reviewed.  Constitutional:      Appearance: Normal appearance. He is normal weight.  Cardiovascular:     Rate and Rhythm: Normal rate and regular rhythm.     Heart sounds: Normal heart sounds. No murmur heard.   No gallop.  Pulmonary:     Effort: Pulmonary effort is normal. No respiratory distress.     Breath sounds: Normal breath sounds. No wheezing or rhonchi.  Abdominal:     General: Bowel sounds are normal. There is no distension.     Palpations: Abdomen is soft.     Tenderness: There is no abdominal tenderness. There is no guarding.  Musculoskeletal:     Right lower leg: No edema.     Left lower leg: No edema.  Neurological:     Mental Status: He is alert.          Assessment & Plan:  Pulmonary  nodule 1 cm or greater in diameter I recommended a pulmonology consultation.  Patient refuses.  I recommended a PET scan.  Patient refuses.  I recommended a CT scan to monitor in November.  Patient refuses.  He does not want to undergo any other work-up for the mass in his lung.  I explained to him that if his cancer is this could be fatal eventually.  Patient states that at his age she does not want any additional work-up.  I was very clear and gave the patient ample opportunity to ask questions.  He states that he understands but  he does not want chemotherapy.  He does not want any type of surgery.  Therefore he prefers not to know.  If he changes his mind, I will be glad to proceed with additional work-up at any point.  Otherwise I would recheck the patient in 3 months and at that time get fasting lab work including an A1c, CBC, CMP and a lipid panel

## 2020-11-25 ENCOUNTER — Telehealth: Payer: Self-pay | Admitting: Nurse Practitioner

## 2020-11-25 NOTE — Telephone Encounter (Signed)
Unable to leave voicemail with follow-up appointment per 9/28 patient call message.

## 2020-12-01 DIAGNOSIS — H401133 Primary open-angle glaucoma, bilateral, severe stage: Secondary | ICD-10-CM | POA: Diagnosis not present

## 2020-12-17 ENCOUNTER — Other Ambulatory Visit: Payer: Self-pay

## 2020-12-17 MED ORDER — AMLODIPINE BESYLATE 5 MG PO TABS: ORAL_TABLET | ORAL | 2 refills | Status: DC

## 2020-12-24 ENCOUNTER — Telehealth: Payer: Self-pay | Admitting: Pharmacist

## 2020-12-24 NOTE — Progress Notes (Signed)
    Chronic Care Management Pharmacy Assistant   Name: Ryan Mosley  MRN: [id number] DOB: [birth year]  Reason for Encounter: Disease State For DM.   Conditions to be addressed/monitored: hypertension, allergic rhinitis, hyperlipidemia, diabetes mellitus.  Recent office visits:  11/20/20 Dr. Dennard Schaumann For follow-up. No medication changes  Recent consult visits:  12/01/20 Ophthalmology Hyman Hopes, MD For glaucoma evaluation. No medication changes. 11/18/20 General Surgery Gross, Adrian Saran, MD For post op. No medication changes.  Hospital visits:  None since   Medications: Outpatient Encounter Medications as of 12/24/2020  Medication Sig   acetaminophen (TYLENOL) 325 MG tablet Take 650 mg by mouth daily.   amLODipine (NORVASC) 5 MG tablet TAKE 1 TABLET BY MOUTH EVERY DAY FOR BLOOD PRESSURE   ferrous sulfate 325 (65 FE) MG EC tablet Take 1 tablet (325 mg total) by mouth 2 (two) times daily. (Patient taking differently: Take 325 mg by mouth every other day.)   latanoprost (XALATAN) 0.005 % ophthalmic solution Place 1 drop into both eyes at bedtime.   oxyCODONE (OXY IR/ROXICODONE) 5 MG immediate release tablet Take 1-2 tablets (5-10 mg total) by mouth every 6 (six) hours as needed for moderate pain, severe pain or breakthrough pain. (Patient not taking: Reported on 11/20/2020)   Pseudoephedrine-Ibuprofen (ADVIL COLD/SINUS PO) Take 1 tablet by mouth daily as needed (sinus relief).   rosuvastatin (CRESTOR) 5 MG tablet TAKE 1 TABLET BY MOUTH ONCE A WEEK FOR CHOLESTEROL (Patient taking differently: Take 5 mg by mouth once a week. FOR CHOLESTEROL)   No facility-administered encounter medications on file as of 12/24/2020.   Recent Relevant Labs: Lab Results  Component Value Date/Time   HGBA1C 6.0 (H) 09/25/2020 06:57 PM   HGBA1C 5.3 08/15/2020 02:20 AM   MICROALBUR 26.4 09/17/2019 08:50 AM    Kidney Function Lab Results  Component Value Date/Time   CREATININE 0.88 10/23/2020  12:54 PM   CREATININE 1.16 09/26/2020 04:37 AM   CREATININE 0.85 09/16/2020 09:14 AM   CREATININE 0.92 07/17/2020 11:38 AM   CREATININE 0.83 03/17/2020 08:42 AM   GFRNONAA >60 10/23/2020 12:54 PM   GFRNONAA 79 07/17/2020 11:38 AM   GFRAA 91 07/17/2020 11:38 AM    Current antihyperglycemic regimen:  None.  What recent interventions/DTPs have been made to improve glycemic control:  None  Have there been any recent hospitalizations or ED visits since last visit with [facility]? No recent hospital or ER visits.   Patient denies hypoglycemic symptoms, including None  Patient denies hyperglycemic symptoms, including none  How often are you checking your blood sugar? Patient stated once daily  What are your blood sugars ranging?  Patient stated his blood sugar ranges around 105-118  During the week, how often does your blood glucose drop below 70? Patient stated never.   Are you checking your feet daily/regularly?  Patient reminded to check his feet regularly.   Adherence Review: Is the patient currently on a STATIN medication? Rosuvastatin 5 mg  Is the patient currently on ACE/ARB medication? N/A.  Does the patient have >5 day gap between last estimated fill dates? Per misc rpts, no.   Care Gaps:Not on the list.  Star Rating Drugs:Rosuvastatin 5 mg 10/30/20 90 DS.   Follow-Up:Pharmacist Review  Charlann Lange, [facility] Pharmacist Assistant [phone],

## 2021-01-25 NOTE — Progress Notes (Deleted)
Ryan Mosley   Telephone:([phone]) [phone] Fax:(336) [phone]   Clinic Follow up Note   Patient Care Team: Susy Frizzle, MD as PCP - General (Family Medicine) Edythe Clarity, [facility] as Pharmacist (Pharmacist) Truitt Merle, MD as Consulting Physician (Hematology) Michael Boston, MD as Consulting Physician (General Surgery) Thornton Park, MD as Consulting Physician (Gastroenterology) Kyung Rudd, MD as Consulting Physician (Radiation Oncology) Truitt Merle, MD as Consulting Physician (Oncology) 01/25/2021  CHIEF COMPLAINT: Follow up rectal cancer   SUMMARY OF ONCOLOGIC HISTORY: Oncology History  Rectal carcinoma ([place])  07/07/2020 Imaging   CTA abdomen/pelvis IMPRESSION: VASCULAR No evidence of acute gastrointestinal hemorrhage, as queried. Aortic Atherosclerosis (ICD10-I70.0). NON-VASCULAR 1. No acute abdominopelvic abnormality. 2. Prostatomegaly.   07/08/2020 Initial Diagnosis   Rectal carcinoma ([place])   07/08/2020 Procedure   Colonoscopy by Dr. Tarri Glenn -Findings The digital rectal exam revealed a rubbery-textured and soft rectal mass. A few small-mouthed diverticula were found in the sigmoid colon. A frond-like/villous non-obstructing large mass was found in the rectum, at least 1 cm from the anal verge. The mass involves approximately 20-25% of the circumference. The mass measured at least 4 cm in length. Oozing was present. Multiple biopsies were obtained with a cold forceps for histology.   07/08/2020 Initial Biopsy   FINAL MICROSCOPIC DIAGNOSIS:  A. RECTUM, BIOPSY:  - Fragments of tubular adenoma with foci of high-grade glandular dysplasia.    07/08/2020 Imaging   CT chest IMPRESSION: 1. 1.4 by 1.1 cm sub solid nodule in the superior segment left lower lobe. This is not typical for metastatic disease but could be inflammatory or conceivably low-grade adenocarcinoma. There is also a 3 mm left lower lobe nodule. Initial follow-up with CT at 6-12 months is  recommended to confirm persistence. If persistent, repeat CT is recommended every 2 years until 5 years of stability has been established. This recommendation follows the consensus statement: Guidelines for Management of Incidental Pulmonary Nodules Detected on CT Images: From the Fleischner Society 2017; Radiology 2017; 284:228-243. 2. Low-density blood pool compatible with anemia. 3.  Aortic Atherosclerosis (ICD10-I70.0). 4.  Emphysema (ICD10-J43.9). 5. Thoracic spondylosis.   07/18/2020 Procedure   Flex sigmoid by Dr. Tarri Glenn - Polypoid lesion in the distal rectum. No ammenable to endoscopic resection. Biopsied. - Diverticulosis in the sigmoid colon.   07/18/2020 Pathology Results   Surgical [P], rectal mass - TUBULOVILLOUS ADENOMA(S) WITH HIGH-GRADE DYSPLASIA, FOCALLY SUSPICIOUS FOR INVASIVE CARCINOMA.   09/25/2020 Cancer Staging   Staging form: Colon and Rectum, AJCC 8th Edition - Pathologic stage from 09/25/2020: Stage IIA (pT3, pN0, cM0) - Signed by Alla Feeling, NP on 10/23/2020 Stage prefix: Initial diagnosis Total positive nodes: 0 Total nodes examined: 2 Histologic grading system: 4 grade system Histologic grade (G): G2    09/25/2020 Definitive Surgery   Robotic converted to transanal partial proctectomy of distal rectal mass by Dr. Johney Maine   09/25/2020 Pathology Results   FINAL MICROSCOPIC DIAGNOSIS: A. 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 - Tumor involves deep edge of fragments. B. 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 - Tumor invades through muscularis propria into pericolorectal tissue. - Margins are negative, see comment. - Two of two lymph nodes negative for carcinoma (0/2). - See oncology table. C. ANTERIOR DEEP MARGIN: - Benign fibroadipose tissue.  pT3, pN0 MMR  normal MSI-stable     CURRENT THERAPY: Pt declined adjuvant chemo and radiation,  on surveillance   INTERVAL HISTORY: Ryan Mosley returns for surveillance visit as scheduled. Last seen by med onc 10/23/20 and Dr. Johney Maine 11/18/20. He declined any adjuvant treatment.    REVIEW OF SYSTEMS:   Constitutional: Denies fevers, chills or abnormal weight loss Eyes: Denies blurriness of vision Ears, nose, mouth, throat, and face: Denies mucositis or sore throat Respiratory: Denies cough, dyspnea or wheezes Cardiovascular: Denies palpitation, chest discomfort or lower extremity swelling Gastrointestinal:  Denies nausea, heartburn or change in bowel habits Skin: Denies abnormal skin rashes Lymphatics: Denies new lymphadenopathy or easy bruising Neurological:Denies numbness, tingling or new weaknesses Behavioral/Psych: Mood is stable, no new changes  All other systems were reviewed with the patient and are negative.  MEDICAL HISTORY:  Past Medical History:  Diagnosis Date   Acute blood loss anemia    on meds   Blood transfusion without reported diagnosis 2022   Cataract    bilateral sx   Diabetes mellitus without complication ([place])    not on meds at this time   GERD (gastroesophageal reflux disease)    hx of   GI bleed    Hyperlipidemia    on meds   Hypertension    on meds   Meningitis 1960/1970   Pneumonia 1960/1970   Rectal polyp     SURGICAL HISTORY: Past Surgical History:  Procedure Laterality Date   BIOPSY  07/08/2020   Procedure: BIOPSY;  Surgeon: Thornton Park, MD;  Location: [facility];  Service: Gastroenterology;;   COLONOSCOPY WITH PROPOFOL N/A 07/08/2020   Procedure: COLONOSCOPY WITH PROPOFOL;  Surgeon: Thornton Park, MD;  Location: [facility];  Service: Gastroenterology;  Laterality: N/A;   DENTAL SURGERY     all teeth removed   XI ROBOT ASSISTED TRANSANAL RESECTION N/A 09/25/2020   Procedure: TAMIS PARTIAL PROCTECTOMY OF RECTAL MASS;  Surgeon: Michael Boston,  MD;  Location[facility];  Service: General;  Laterality: N/A;    I have reviewed the social history and family history with the patient and they are unchanged from previous note.  ALLERGIES:  has No Known Allergies.  MEDICATIONS:  Current Outpatient Medications  Medication Sig Dispense Refill   acetaminophen (TYLENOL) 325 MG tablet Take 650 mg by mouth daily.     amLODipine (NORVASC) 5 MG tablet TAKE 1 TABLET BY MOUTH EVERY DAY FOR BLOOD PRESSURE 90 tablet 2   ferrous sulfate 325 (65 FE) MG EC tablet Take 1 tablet (325 mg total) by mouth 2 (two) times daily. (Patient taking differently: Take 325 mg by mouth every other day.) 60 tablet 3   latanoprost (XALATAN) 0.005 % ophthalmic solution Place 1 drop into both eyes at bedtime.  3   oxyCODONE (OXY IR/ROXICODONE) 5 MG immediate release tablet Take 1-2 tablets (5-10 mg total) by mouth every 6 (six) hours as needed for moderate pain, severe pain or breakthrough pain. (Patient not taking: Reported on 11/20/2020) 30 tablet 0   Pseudoephedrine-Ibuprofen (ADVIL COLD/SINUS PO) Take 1 tablet by mouth daily as needed (sinus relief).     rosuvastatin (CRESTOR) 5 MG tablet TAKE 1 TABLET BY MOUTH ONCE A WEEK FOR CHOLESTEROL (Patient taking differently: Take 5 mg by mouth once a week. FOR CHOLESTEROL) 12 tablet 2   No current facility-administered medications for this visit.    PHYSICAL EXAMINATION: ECOG PERFORMANCE STATUS: {CHL ONC ECOG PS:[phone]}  There were no vitals filed for this visit. There were no vitals filed for this visit.  GENERAL:alert, no distress and comfortable SKIN: skin color, texture, turgor are normal, no  rashes or significant lesions EYES: normal, Conjunctiva are pink and non-injected, sclera clear OROPHARYNX:no exudate, no erythema and lips, buccal mucosa, and tongue normal  NECK: supple, thyroid normal size, non-tender, without nodularity LYMPH:  no palpable lymphadenopathy in the cervical, axillary or inguinal LUNGS: clear  to auscultation and percussion with normal breathing effort HEART: regular rate & rhythm and no murmurs and no lower extremity edema ABDOMEN:abdomen soft, non-tender and normal bowel sounds Musculoskeletal:no cyanosis of digits and no clubbing  NEURO: alert & oriented x 3 with fluent speech, no focal motor/sensory deficits  LABORATORY DATA:  I have reviewed the data as listed CBC Latest Ref Rng & Units 10/23/2020 09/26/2020 09/16/2020  WBC 4.0 - 10.5 K/uL 6.8 24.7(H) 7.0  Hemoglobin 13.0 - 17.0 g/dL 11.5(L) 10.6(L) 11.1(L)  Hematocrit 39.0 - 52.0 % 37.5(L) 35.5(L) 37.1(L)  Platelets 150 - 400 K/uL 261 294 300     CMP Latest Ref Rng & Units 10/23/2020 09/26/2020 09/16/2020  Glucose 70 - 99 mg/dL 105(H) 160(H) 113(H)  BUN 8 - 23 mg/dL 10 12 10   Creatinine 0.61 - 1.24 mg/dL 0.88 1.16 0.85  Sodium 135 - 145 mmol/L 141 136 139  Potassium 3.5 - 5.1 mmol/L 4.5 3.7 4.1  Chloride 98 - 111 mmol/L 107 107 105  CO2 22 - 32 mmol/L 24 20(L) 27  Calcium 8.9 - 10.3 mg/dL 9.9 8.8(L) 9.2  Total Protein 6.5 - 8.1 g/dL 8.7(H) - -  Total Bilirubin 0.3 - 1.2 mg/dL <0.2(L) - -  Alkaline Phos 38 - 126 U/L 79 - -  AST 15 - 41 U/L 16 - -  ALT 0 - 44 U/L 13 - -      RADIOGRAPHIC STUDIES: I have personally reviewed the radiological images as listed and agreed with the findings in the report. No results found.   ASSESSMENT & PLAN:  No problem-specific Assessment & Plan notes found for this encounter.   No orders of the defined types were placed in this encounter.  All questions were answered. The patient knows to call the clinic with any problems, questions or concerns. No barriers to learning was detected. I spent {CHL ONC TIME VISIT - XTGGY:[id number]} counseling the patient face to face. The total time spent in the appointment was {CHL ONC TIME VISIT - JJKKX:[id number]} and more than 50% was on counseling and review of test results     Alla Feeling, NP 01/25/21

## 2021-01-26 ENCOUNTER — Inpatient Hospital Stay: Payer: Medicare Other | Attending: Nurse Practitioner | Admitting: Nurse Practitioner

## 2021-01-26 ENCOUNTER — Inpatient Hospital Stay: Payer: Medicare Other

## 2021-01-26 ENCOUNTER — Telehealth: Payer: Self-pay

## 2021-01-26 NOTE — Telephone Encounter (Signed)
Pt did not arrive to appointment with Ryan Rue, NP today. This RN called pt and no answer, left a voicemail with call back number to re-schedule.

## 2021-01-27 ENCOUNTER — Other Ambulatory Visit: Payer: Self-pay

## 2021-01-27 MED ORDER — ROSUVASTATIN CALCIUM 5 MG PO TABS: ORAL_TABLET | ORAL | 2 refills | Status: DC

## 2021-02-09 DIAGNOSIS — C20 Malignant neoplasm of rectum: Secondary | ICD-10-CM | POA: Diagnosis not present

## 2021-02-27 ENCOUNTER — Other Ambulatory Visit: Payer: Self-pay

## 2021-02-27 MED ORDER — LISINOPRIL 40 MG PO TABS: 40.0000 mg | ORAL_TABLET | Freq: Every day | ORAL | 3 refills | Status: DC

## 2021-03-02 ENCOUNTER — Ambulatory Visit (INDEPENDENT_AMBULATORY_CARE_PROVIDER_SITE_OTHER): Payer: Medicare Other | Admitting: Family Medicine

## 2021-03-02 ENCOUNTER — Other Ambulatory Visit: Payer: Self-pay

## 2021-03-02 ENCOUNTER — Encounter: Payer: Self-pay | Admitting: Family Medicine

## 2021-03-02 VITALS — BP 142/78 | HR 83 | Temp 98.2°F | Resp 18 | Ht 69.0 in | Wt 171.0 lb

## 2021-03-02 DIAGNOSIS — R911 Solitary pulmonary nodule: Secondary | ICD-10-CM | POA: Diagnosis not present

## 2021-03-02 DIAGNOSIS — I10 Essential (primary) hypertension: Secondary | ICD-10-CM | POA: Diagnosis not present

## 2021-03-02 DIAGNOSIS — K922 Gastrointestinal hemorrhage, unspecified: Secondary | ICD-10-CM

## 2021-03-02 DIAGNOSIS — C20 Malignant neoplasm of rectum: Secondary | ICD-10-CM | POA: Diagnosis not present

## 2021-03-02 DIAGNOSIS — R7301 Impaired fasting glucose: Secondary | ICD-10-CM | POA: Diagnosis not present

## 2021-03-02 NOTE — Progress Notes (Signed)
Subjective:    Patient ID: Ryan Mosley, male    DOB: [birth year], 81 y.o.   MRN: [id number]  HPI 9/22 Patient was admitted to the hospital in May with a GI bleed.  Colonoscopy revealed rectal cancer.  He recently underwent resection for the rectal cancer.  Staging was T3N0.  Margins were clear on the biopsy and lymph nodes were negative.  However during his hospitalization a CT scan of the chest showed a mass in the left lung.  Results are included below: IMPRESSION: 1. 1.4 by 1.1 cm sub solid nodule in the superior segment left lower lobe. This is not typical for metastatic disease but could be inflammatory or conceivably low-grade adenocarcinoma. There is also a 3 mm left lower lobe nodule. Initial follow-up with CT at 6-12 months is recommended to confirm persistence. If persistent, repeat CT is recommended every 2 years until 5 years of stability has been established. This recommendation follows the consensus statement: Guidelines for Management of Incidental Pulmonary Nodules Detected on CT Images: From the Fleischner Society 2017; Radiology 2017; 284:228-243. 2. Low-density blood pool compatible with anemia. 3.  Aortic Atherosclerosis (ICD10-I70.0). 4.  Emphysema (ICD10-J43.9). 5. Thoracic spondylosis.  Patient is here today to discuss this.  I recommended that we perform either a PET scan or a CT scan or consult pulmonology regarding the mass in his lung.  The patient states that he is almost 81 years old and he does not want to undergo any more work-up.  He wants to wait for now.  I explained to him that if we wait and this is cancer, it could likely grow and spread and then be incurable when he does want to pursue it further.  The patient states that he understands this however he has no desire for any additional testing.  03/02/20 Patient is here today for follow-up.  He has a history of invasive adenocarcinoma of the rectum.  This is being followed by his general surgeon and his  gastroenterologist.  He is here today to follow-up his hypertension.  His blood pressure is well controlled.  Also discussed with him the CAT scan that he had in May.  I explained to him that the nodule in his lung may be cancerous.  They recommended repeating a CAT scan in 6 to 12 months to monitor this.  The patient declines further testing.  He states that he wants to wait until he has symptoms.  I explained to him that it is cancer, and we wait until symptoms arise, it would likely be too late to do any treatment.  Patient seems confused.  He was not aware that he had cancer in his rectum.  I again discussed this with him and also recommended repeating a CAT scan of his lung to monitor the nodule.  He defers this at the present time Past Medical History:  Diagnosis Date   Acute blood loss anemia    on meds   Blood transfusion without reported diagnosis 2022   Cataract    bilateral sx   Diabetes mellitus without complication ([place])    not on meds at this time   GERD (gastroesophageal reflux disease)    hx of   GI bleed    Hyperlipidemia    on meds   Hypertension    on meds   Meningitis 1960/1970   Pneumonia 1960/1970   Rectal polyp    Past Surgical History:  Procedure Laterality Date   BIOPSY  07/08/2020   Procedure: BIOPSY;  Surgeon: Thornton Park, MD;  Location: [facility];  Service: Gastroenterology;;   COLONOSCOPY WITH PROPOFOL N/A 07/08/2020   Procedure: COLONOSCOPY WITH PROPOFOL;  Surgeon: Thornton Park, MD;  Location: [facility];  Service: Gastroenterology;  Laterality: N/A;   DENTAL SURGERY     all teeth removed   XI ROBOT ASSISTED TRANSANAL RESECTION N/A 09/25/2020   Procedure: TAMIS PARTIAL PROCTECTOMY OF RECTAL MASS;  Surgeon: Michael Boston, MD;  Location: [facility];  Service: General;  Laterality: N/A;   Current Outpatient Medications on File Prior to Visit  Medication Sig Dispense Refill   acetaminophen (TYLENOL) 325 MG tablet Take 650 mg by mouth daily.      amLODipine (NORVASC) 5 MG tablet TAKE 1 TABLET BY MOUTH EVERY DAY FOR BLOOD PRESSURE 90 tablet 2   latanoprost (XALATAN) 0.005 % ophthalmic solution Place 1 drop into both eyes at bedtime.  3   lisinopril (ZESTRIL) 40 MG tablet Take 1 tablet (40 mg total) by mouth daily. 90 tablet 3   rosuvastatin (CRESTOR) 5 MG tablet TAKE 1 TABLET BY MOUTH ONCE A WEEK FOR CHOLESTEROL 12 tablet 2   ferrous sulfate 325 (65 FE) MG EC tablet Take 1 tablet (325 mg total) by mouth 2 (two) times daily. (Patient not taking: Reported on 03/02/2021) 60 tablet 3   Pseudoephedrine-Ibuprofen (ADVIL COLD/SINUS PO) Take 1 tablet by mouth daily as needed (sinus relief). (Patient not taking: Reported on 03/02/2021)     No current facility-administered medications on file prior to visit.   No Known Allergies Social History   Socioeconomic History   Marital status: Married    Spouse name: Not on file   Number of children: Not on file   Years of education: Not on file   Highest education level: Not on file  Occupational History   Not on file  Tobacco Use   Smoking status: Former   Smokeless tobacco: Current    Types: Chew  Vaping Use   Vaping Use: Never used  Substance and Sexual Activity   Alcohol use: Not Currently    Alcohol/week: 1.0 standard drink    Types: 1 Standard drinks or equivalent per week   Drug use: Never   Sexual activity: Not on file  Other Topics Concern   Not on file  Social History Narrative   Not on file   Social Determinants of Health   Financial Resource Strain: Not on file  Food Insecurity: Not on file  Transportation Needs: Not on file  Physical Activity: Not on file  Stress: Not on file  Social Connections: Not on file  Intimate Partner Violence: Not on file     Review of Systems  All other systems reviewed and are negative.     Objective:   Physical Exam Vitals reviewed.  Constitutional:      Appearance: Normal appearance. He is normal weight.  Cardiovascular:     Rate  and Rhythm: Normal rate and regular rhythm.     Heart sounds: Normal heart sounds. No murmur heard.   No gallop.  Pulmonary:     Effort: Pulmonary effort is normal. No respiratory distress.     Breath sounds: Normal breath sounds. No wheezing or rhonchi.  Abdominal:     General: Bowel sounds are normal. There is no distension.     Palpations: Abdomen is soft.     Tenderness: There is no abdominal tenderness. There is no guarding.  Musculoskeletal:     Right lower leg: No edema.     Left  lower leg: No edema.  Neurological:     Mental Status: He is alert.          Assessment & Plan:  Benign essential HTN - Plan: CBC with Differential/Platelet, COMPLETE METABOLIC PANEL WITH GFR, Lipid panel  Pulmonary nodule 1 cm or greater in diameter  Gastrointestinal hemorrhage, unspecified gastrointestinal hemorrhage type  Rectal cancer ([place]) As I did at his last appointment I recommended repeating at least a CAT scan to monitor the lesion in his lung.  He refuses.  I explained to him that waiting until he has symptoms would likely be too late to treat any cancer that he had.  He still declines at the present time.  His blood pressure today is acceptable.  I will check a CBC to monitor for any anemia given his history of GI bleeding, CMP and a lipid panel.  His goal LDL cholesterol is less than 100.  I explained to the patient that I would be happy to repeat a CAT scan at any time of his lungs and that this would be my recommendation.  He politely defers at the present time.

## 2021-03-05 ENCOUNTER — Encounter: Payer: Self-pay | Admitting: Family Medicine

## 2021-03-05 ENCOUNTER — Telehealth: Payer: Self-pay

## 2021-03-05 LAB — LIPID PANEL
Cholesterol: 201 mg/dL — ABNORMAL HIGH (ref <200)
HDL: 48 mg/dL (ref >=40)
LDL Cholesterol (Calc): 133 mg/dL (calc) — ABNORMAL HIGH
Non-HDL Cholesterol (Calc): 153 mg/dL (calc) — ABNORMAL HIGH (ref <130)
Total CHOL/HDL Ratio: 4.2 (calc) (ref <5.0)
Triglycerides: 97 mg/dL (ref <150)

## 2021-03-05 LAB — COMPLETE METABOLIC PANEL WITH GFR
AG Ratio: 1.5 (calc) (ref 1.0–2.5)
ALT: 19 U/L (ref 9–46)
AST: 20 U/L (ref 10–35)
Albumin: 4.6 g/dL (ref 3.6–5.1)
Alkaline phosphatase (APISO): 79 U/L (ref 35–144)
BUN: 14 mg/dL (ref 7–25)
CO2: 24 mmol/L (ref 20–32)
Calcium: 9.4 mg/dL (ref 8.6–10.3)
Chloride: 104 mmol/L (ref 98–110)
Creat: 0.83 mg/dL (ref 0.70–1.28)
Globulin: 3 g/dL (calc) (ref 1.9–3.7)
Glucose, Bld: 134 mg/dL — ABNORMAL HIGH (ref 65–99)
Potassium: 4.4 mmol/L (ref 3.5–5.3)
Sodium: 139 mmol/L (ref 135–146)
Total Bilirubin: 0.5 mg/dL (ref 0.2–1.2)
Total Protein: 7.6 g/dL (ref 6.1–8.1)
eGFR: 89 mL/min/{1.73_m2} (ref >=60)

## 2021-03-05 LAB — CBC WITH DIFFERENTIAL/PLATELET
Absolute Monocytes: 531 cells/uL (ref 200–950)
Basophils Absolute: 28 cells/uL (ref 0–200)
Basophils Relative: 0.4 %
Eosinophils Absolute: 117 cells/uL (ref 15–500)
Eosinophils Relative: 1.7 %
HCT: 47.7 % (ref 38.5–50.0)
Hemoglobin: 15.5 g/dL (ref 13.2–17.1)
Lymphs Abs: 1808 cells/uL (ref 850–3900)
MCH: 29.6 pg (ref 27.0–33.0)
MCHC: 32.5 g/dL (ref 32.0–36.0)
MCV: 91.2 fL (ref 80.0–100.0)
MPV: 9.7 fL (ref 7.5–12.5)
Monocytes Relative: 7.7 %
Neutro Abs: 4416 cells/uL (ref 1500–7800)
Neutrophils Relative %: 64 %
Platelets: 252 10*3/uL (ref 140–400)
RBC: 5.23 10*6/uL (ref 4.20–5.80)
RDW: 14.6 % (ref 11.0–15.0)
Total Lymphocyte: 26.2 %
WBC: 6.9 10*3/uL (ref 3.8–10.8)

## 2021-03-05 LAB — TEST AUTHORIZATION

## 2021-03-05 LAB — HEMOGLOBIN A1C W/OUT EAG: Hgb A1c MFr Bld: 6.6 % of total Hgb — ABNORMAL HIGH (ref <5.7)

## 2021-03-05 NOTE — Telephone Encounter (Signed)
-----   Message from Susy Frizzle, MD sent at 03/05/2021  6:47 AM EST ----- Labs show the patient has a diabetic.  However it is controlled at the present time and does not require additional medication.  Recheck in 6 months

## 2021-03-05 NOTE — Telephone Encounter (Signed)
Left message for patient to call back regarding results and recommendations. °

## 2021-03-16 DIAGNOSIS — H401133 Primary open-angle glaucoma, bilateral, severe stage: Secondary | ICD-10-CM | POA: Diagnosis not present

## 2021-03-26 ENCOUNTER — Ambulatory Visit (INDEPENDENT_AMBULATORY_CARE_PROVIDER_SITE_OTHER): Payer: Medicare Other

## 2021-03-26 ENCOUNTER — Other Ambulatory Visit: Payer: Self-pay

## 2021-03-26 VITALS — Ht 69.0 in | Wt 171.0 lb

## 2021-03-26 DIAGNOSIS — Z0000 Encounter for general adult medical examination without abnormal findings: Secondary | ICD-10-CM

## 2021-03-26 NOTE — Progress Notes (Signed)
Subjective:   Ryan Mosley is a 81 y.o. male who presents for Medicare Annual/Subsequent preventive examination. Virtual Visit via Telephone Note  I connected with  Ryan Mosley on 03/26/21 at 11:15 AM EST by telephone and verified that I am speaking with the correct person using two identifiers.  Location: Patient: HOME Provider: [facility] Persons participating in the virtual visit: patient/Nurse Health Advisor   I discussed the limitations, risks, security and privacy concerns of performing an evaluation and management service by telephone and the availability of in person appointments. The patient expressed understanding and agreed to proceed.  Interactive audio and video telecommunications were attempted between this nurse and patient, however failed, due to patient having technical difficulties OR patient did not have access to video capability.  We continued and completed visit with audio only.  Some vital signs may be absent or patient reported.   Ryan Driver, LPN  Review of Systems     Cardiac Risk Factors include: advanced age (>9men, >25 women);diabetes mellitus;hypertension;dyslipidemia;male gender  PHONE VISIT. PT AT HOME. NURSE AT [facility].    Objective:    Today's Vitals   03/26/21 1134  Weight: 171 lb (77.6 kg)  Height: 5\' 9"  (1.753 m)   Body mass index is 25.25 kg/m.  Advanced Directives 03/26/2021 10/07/2020 09/25/2020 09/16/2020 08/16/2020 07/08/2020 07/07/2020  Does Patient Have a Medical Advance Directive? No No No No No No No  Would patient like information on creating a medical advance directive? No - Patient declined No - Patient declined No - Patient declined No - Patient declined No - Patient declined No - Patient declined Yes (ED - Information included in AVS)    Current Medications (verified) Outpatient Encounter Medications as of 03/26/2021  Medication Sig   acetaminophen (TYLENOL) 325 MG tablet Take 650 mg by mouth daily.   amLODipine (NORVASC) 5 MG  tablet TAKE 1 TABLET BY MOUTH EVERY DAY FOR BLOOD PRESSURE   ferrous sulfate 325 (65 FE) MG EC tablet Take 1 tablet (325 mg total) by mouth 2 (two) times daily.   FLUZONE HIGH-DOSE QUADRIVALENT 0.7 ML SUSY    latanoprost (XALATAN) 0.005 % ophthalmic solution Place 1 drop into both eyes at bedtime.   lisinopril (ZESTRIL) 40 MG tablet Take 1 tablet (40 mg total) by mouth daily.   Pseudoephedrine-Ibuprofen (ADVIL COLD/SINUS PO) Take 1 tablet by mouth daily as needed (sinus relief).   rosuvastatin (CRESTOR) 5 MG tablet TAKE 1 TABLET BY MOUTH ONCE A WEEK FOR CHOLESTEROL   No facility-administered encounter medications on file as of 03/26/2021.    Allergies (verified) Patient has no known allergies.   History: Past Medical History:  Diagnosis Date   Acute blood loss anemia    on meds   Blood transfusion without reported diagnosis 2022   Cataract    bilateral sx   Controlled diabetes mellitus type 2 with complications ([place])    Diabetes mellitus without complication ([place])    not on meds at this time   GERD (gastroesophageal reflux disease)    hx of   GI bleed    Hyperlipidemia    on meds   Hypertension    on meds   Meningitis 1960/1970   Pneumonia 1960/1970   Rectal polyp    Past Surgical History:  Procedure Laterality Date   BIOPSY  07/08/2020   Procedure: BIOPSY;  Surgeon: Thornton Park, MD;  Location: [facility];  Service: Gastroenterology;;   COLONOSCOPY WITH PROPOFOL N/A 07/08/2020   Procedure: COLONOSCOPY WITH PROPOFOL;  Surgeon: Thornton Park, MD;  Location: [facility];  Service: Gastroenterology;  Laterality: N/A;   DENTAL SURGERY     all teeth removed   XI ROBOT ASSISTED TRANSANAL RESECTION N/A 09/25/2020   Procedure: TAMIS PARTIAL PROCTECTOMY OF RECTAL MASS;  Surgeon: Michael Boston, MD;  [facility]: [facility];  Service: General;  Laterality: N/A;   Family History  Problem Relation Age of Onset   Seizures Daughter    Diabetes Son    Colon polyps Neg Hx    Colon  cancer Neg Hx    Esophageal cancer Neg Hx    Rectal cancer Neg Hx    Stomach cancer Neg Hx    Social History   Socioeconomic History   Marital status: Married    Spouse name: Ryan Mosley   Number of children: Not on file   Years of education: Not on file   Highest education level: Not on file  Occupational History   Not on file  Tobacco Use   Smoking status: Former   Smokeless tobacco: Current    Types: Nurse, children's Use: Never used  Substance and Sexual Activity   Alcohol use: Not Currently    Alcohol/week: 1.0 standard drink    Types: 1 Standard drinks or equivalent per week   Drug use: Never   Sexual activity: Yes  Other Topics Concern   Not on file  Social History Narrative   Not on file   Social Determinants of Health   Financial Resource Strain: Low Risk    Difficulty of Paying Living Expenses: Not hard at all  Food Insecurity: No Food Insecurity   Worried About Charity fundraiser in the Last Year: Never true   Ran Out of Food in the Last Year: Never true  Transportation Needs: No Transportation Needs   Lack of Transportation (Medical): No   Lack of Transportation (Non-Medical): No  Physical Activity: Sufficiently Active   Days of Exercise per Week: 5 days   Minutes of Exercise per Session: 30 min  Stress: No Stress Concern Present   Feeling of Stress : Not at all  Social Connections: Moderately Isolated   Frequency of Communication with Friends and Family: More than three times a week   Frequency of Social Gatherings with Friends and Family: More than three times a week   Attends Religious Services: Never   Marine scientist or Organizations: No   Attends Music therapist: Never   Marital Status: Married    Tobacco Counseling Ready to quit: Not Answered Counseling given: Not Answered   Clinical Intake:  Pre-visit preparation completed: Yes  Pain : No/denies pain     Diabetes: No  How often do you need to have  someone help you when you read instructions, pamphlets, or other written materials from your doctor or pharmacy?: 1 - Never  Diabetic?Nutrition Risk Assessment:  Has the patient had any N/V/D within the last 2 months?  No  Does the patient have any non-healing wounds?  No  Has the patient had any unintentional weight loss or weight gain?  No   Diabetes:  Is the patient diabetic?  Yes  If diabetic, was a CBG obtained today?  No  Did the patient bring in their glucometer from home?  No  How often do you monitor your CBG's? Daily.   Financial Strains and Diabetes Management:  Are you having any financial strains with the device, your supplies or your medication? No .  Does  the patient want to be seen by Chronic Care Management for management of their diabetes?  No  Would the patient like to be referred to a Nutritionist or for Diabetic Management?  No   Diabetic Exams:  Diabetic Eye Exam: Completed 02/2021.  Pt has been advised about the importance in completing this exam.  Diabetic Foot Exam: Completed 03/17/2020. Pt has been advised about the importance in completing this exam.  Interpreter Needed?: No      Activities of Daily Living In your present state of health, do you have any difficulty performing the following activities: 03/26/2021 09/25/2020  Hearing? N N  Vision? N N  Difficulty concentrating or making decisions? N N  Walking or climbing stairs? N N  Dressing or bathing? N N  Doing errands, shopping? N N  Preparing Food and eating ? N -  Using the Toilet? N -  In the past six months, have you accidently leaked urine? N -  Do you have problems with loss of bowel control? N -  Managing your Medications? N -  Managing your Finances? N -  Housekeeping or managing your Housekeeping? N -  Some recent data might be hidden    Patient Care Team: Susy Frizzle, MD as PCP - General (Family Medicine) Edythe Clarity, [facility] as Pharmacist (Pharmacist) Truitt Merle, MD as  Consulting Physician (Hematology) Michael Boston, MD as Consulting Physician (General Surgery) Thornton Park, MD as Consulting Physician (Gastroenterology) Kyung Rudd, MD as Consulting Physician (Radiation Oncology) Truitt Merle, MD as Consulting Physician (Oncology)  Indicate any recent Medical Services you may have received from other than [facility] providers in the past year (date may be approximate).     Assessment:   This is a routine wellness examination for Ryan Mosley.  Hearing/Vision screen Hearing Screening - Comments:: No hearing issues. Vision Screening - Comments:: Readers. Dr. Gershon Crane. 02/2021.  Dietary issues and exercise activities discussed: Current Exercise Habits: Home exercise routine, Type of exercise: walking, Time (Minutes): 30, Frequency (Times/Week): 5, Weekly Exercise (Minutes/Week): 150, Intensity: Mild, Exercise limited by: cardiac condition(s)   Goals Addressed             This Visit's Progress    Exercise 3x per week (30 min per time)       Continue to exercise and eat a healthy diet.        Depression Screen PHQ 2/9 Scores 03/26/2021 11/20/2020 03/17/2020 09/17/2019 03/19/2019 09/11/2018 03/13/2018  PHQ - 2 Score 0 0 0 0 0 0 0  PHQ- 9 Score - - 2 - 0 - -    Fall Risk Fall Risk  03/26/2021 11/20/2020 03/17/2020 09/17/2019 03/19/2019  Falls in the past year? 0 0 1 0 0  Number falls in past yr: 0 0 0 - 0  Injury with Fall? 0 0 0 - 0  Risk for fall due to : No Fall Risks No Fall Risks No Fall Risks No Fall Risks -  Follow up Falls prevention discussed Falls evaluation completed Falls evaluation completed Falls evaluation completed -    FALL RISK PREVENTION PERTAINING TO THE HOME:  Any stairs in or around the home? No  If so, are there any without handrails? No  Home free of loose throw rugs in walkways, pet beds, electrical cords, etc? Yes  Adequate lighting in your home to reduce risk of falls? Yes   ASSISTIVE DEVICES UTILIZED TO PREVENT FALLS:  Life alert?  No  Use of a cane, walker or w/c? No  Grab  bars in the bathroom? No  Shower chair or bench in shower? Yes  Elevated toilet seat or a handicapped toilet? No   TIMED UP AND GO:  Was the test performed? No .    Cognitive Function:Normal cognitive status assessed by direct observation by this Nurse Health Advisor. No abnormalities found.          Immunizations Immunization History  Administered Date(s) Administered   Fluad Quad(high Dose 65+) 11/17/2019   Influenza, High Dose Seasonal PF 11/21/2016, 12/20/2017, 11/15/2018   Influenza-Unspecified 11/18/2020   Moderna Sars-Covid-2 Vaccination 01/08/2020   PFIZER(Purple Top)SARS-COV-2 Vaccination 05/25/2019, 06/22/2019, 02/19/2021    TDAP status: Due, Education has been provided regarding the importance of this vaccine. Advised may receive this vaccine at local pharmacy or Health Dept. Aware to provide a copy of the vaccination record if obtained from local pharmacy or Health Dept. Verbalized acceptance and understanding.  Flu Vaccine status: Up to date  Pneumococcal vaccine status: Declined,  Education has been provided regarding the importance of this vaccine but patient still declined. Advised may receive this vaccine at local pharmacy or Health Dept. Aware to provide a copy of the vaccination record if obtained from local pharmacy or Health Dept. Verbalized acceptance and understanding.   Covid-19 vaccine status: Completed vaccines  Qualifies for Shingles Vaccine? Yes   Zostavax completed No   Shingrix Completed?: No.    Education has been provided regarding the importance of this vaccine. Patient has been advised to call insurance company to determine out of pocket expense if they have not yet received this vaccine. Advised may also receive vaccine at local pharmacy or Health Dept. Verbalized acceptance and understanding.  Screening Tests Health Maintenance  Topic Date Due   Pneumonia Vaccine 81+ Years old (1 - PCV) Never done    Zoster Vaccines- Shingrix (1 of 2) Never done   OPHTHALMOLOGY EXAM  02/24/2021   FOOT EXAM  03/17/2021   TETANUS/TDAP  03/13/2028 (Originally 04/10/1959)   COVID-19 Vaccine (5 - Booster) 04/16/2021   HEMOGLOBIN A1C  08/30/2021   INFLUENZA VACCINE  Completed   Hepatitis C Screening  Completed   HPV VACCINES  Aged Out    Health Maintenance  Health Maintenance Due  Topic Date Due   Pneumonia Vaccine 75+ Years old (1 - PCV) Never done   Zoster Vaccines- Shingrix (1 of 2) Never done   OPHTHALMOLOGY EXAM  02/24/2021   FOOT EXAM  03/17/2021    Colorectal cancer screening: Type of screening: Colonoscopy. Completed 07/08/2020. Repeat every 3 years  Lung Cancer Screening: (Low Dose CT Chest recommended if Age 36-80 years, 30 pack-year currently smoking OR have quit w/in 15years.) does not qualify.   Additional Screening:  Hepatitis C Screening: does qualify; Completed 09/17/2019  Vision Screening: Recommended annual ophthalmology exams for early detection of glaucoma and other disorders of the eye. Is the patient up to date with their annual eye exam?  Yes  Who is the provider or what is the name of the office in which the patient attends annual eye exams? Dr. Gershon Crane If pt is not established with a provider, would they like to be referred to a provider to establish care? No .   Dental Screening: Recommended annual dental exams for proper oral hygiene  Community Resource Referral / Chronic Care Management: CRR required this visit?  No   CCM required this visit?  No      Plan:     I have personally reviewed and noted the following in the  patients chart:   Medical and social history Use of alcohol, tobacco or illicit drugs  Current medications and supplements including opioid prescriptions. Patient is not currently taking opioid prescriptions. Functional ability and status Nutritional status Physical activity Advanced directives List of other physicians Hospitalizations,  surgeries, and ER visits in previous 12 months Vitals Screenings to include cognitive, depression, and falls Referrals and appointments  In addition, I have reviewed and discussed with patient certain preventive protocols, quality metrics, and best practice recommendations. A written personalized care plan for preventive services as well as general preventive health recommendations were provided to patient.     Ryan Driver, LPN   02/27/1094   Nurse Notes: Pt is up to date on health maintenance. Discussed shingrix, pneumococcal, and tdap vaccines and how to obtain.

## 2021-03-26 NOTE — Patient Instructions (Signed)
[name] , Thank you for taking time to come for your Medicare Wellness Visit. I appreciate your ongoing commitment to your health goals. Please review the following plan we discussed and let me know if I can assist you in the future.   Screening recommendations/referrals: Colonoscopy: Done 07/08/2020 Follow up as scheduled by GI.  Recommended yearly ophthalmology/optometry visit for glaucoma screening and checkup Recommended yearly dental visit for hygiene and checkup  Vaccinations: Influenza vaccine: Done 11/18/2020 Repeat annually  Pneumococcal vaccine: Declined. Tdap vaccine: Due Repeat in 10 years  Shingles vaccine: Declined.   Covid-19: Done 05/25/2019, 06/22/2019, 01/08/2020 and 02/19/2021.  Advanced directives: Advance directive discussed with you today. Even though you declined this today, please call our office should you change your mind, and we can give you the proper paperwork for you to fill out.   Conditions/risks identified: Aim for 30 minutes of exercise or brisk walking each day, drink 6-8 glasses of water and eat lots of fruits and vegetables. KEEP UP THE GOOD WORK!!  Next appointment: Follow up in one year for your annual wellness visit. 2024.  Preventive Care 55 Years and Older, Male  Preventive care refers to lifestyle choices and visits with your health care provider that can promote health and wellness. What does preventive care include? A yearly physical exam. This is also called an annual well check. Dental exams once or twice a year. Routine eye exams. Ask your health care provider how often you should have your eyes checked. Personal lifestyle choices, including: Daily care of your teeth and gums. Regular physical activity. Eating a healthy diet. Avoiding tobacco and drug use. Limiting alcohol use. Practicing safe sex. Taking low doses of aspirin every day. Taking vitamin and mineral supplements as recommended by your health care provider. What  happens during an annual well check? The services and screenings done by your health care provider during your annual well check will depend on your age, overall health, lifestyle risk factors, and family history of disease. Counseling  Your health care provider may ask you questions about your: Alcohol use. Tobacco use. Drug use. Emotional well-being. Home and relationship well-being. Sexual activity. Eating habits. History of falls. Memory and ability to understand (cognition). Work and work Statistician. Screening  You may have the following tests or measurements: Height, weight, and BMI. Blood pressure. Lipid and cholesterol levels. These may be checked every 5 years, or more frequently if you are over 26 years old. Skin check. Lung cancer screening. You may have this screening every year starting at age 29 if you have a 30-pack-year history of smoking and currently smoke or have quit within the past 15 years. Fecal occult blood test (FOBT) of the stool. You may have this test every year starting at age 45. Flexible sigmoidoscopy or colonoscopy. You may have a sigmoidoscopy every 5 years or a colonoscopy every 10 years starting at age 72. Prostate cancer screening. Recommendations will vary depending on your family history and other risks. Hepatitis C blood test. Hepatitis B blood test. Sexually transmitted disease (STD) testing. Diabetes screening. This is done by checking your blood sugar (glucose) after you have not eaten for a while (fasting). You may have this done every 1-3 years. Abdominal aortic aneurysm (AAA) screening. You may need this if you are a current or former smoker. Osteoporosis. You may be screened starting at age 37 if you are at high risk. Talk with your health care provider about your test results, treatment options, and if necessary, the  need for more tests. Vaccines  Your health care provider may recommend certain vaccines, such as: Influenza vaccine. This  is recommended every year. Tetanus, diphtheria, and acellular pertussis (Tdap, Td) vaccine. You may need a Td booster every 10 years. Zoster vaccine. You may need this after age 56. Pneumococcal 13-valent conjugate (PCV13) vaccine. One dose is recommended after age 17. Pneumococcal polysaccharide (PPSV23) vaccine. One dose is recommended after age 73. Talk to your health care provider about which screenings and vaccines you need and how often you need them. This information is not intended to replace advice given to you by your health care provider. Make sure you discuss any questions you have with your health care provider. Document Released: 03/07/2015 Document Revised: 10/29/2015 Document Reviewed: 12/10/2014 Elsevier Interactive Patient Education  2017 [place] Prevention in the Home Falls can cause injuries. They can happen to people of all ages. There are many things you can do to make your home safe and to help prevent falls. What can I do on the outside of my home? Regularly fix the edges of walkways and driveways and fix any cracks. Remove anything that might make you trip as you walk through a door, such as a raised step or threshold. Trim any bushes or trees on the path to your home. Use bright outdoor lighting. Clear any walking paths of anything that might make someone trip, such as rocks or tools. Regularly check to see if handrails are loose or broken. Make sure that both sides of any steps have handrails. Any raised decks and porches should have guardrails on the edges. Have any leaves, snow, or ice cleared regularly. Use sand or salt on walking paths during winter. Clean up any spills in your garage right away. This includes oil or grease spills. What can I do in the bathroom? Use night lights. Install grab bars by the toilet and in the tub and shower. Do not use towel bars as grab bars. Use non-skid mats or decals in the tub or shower. If you need to sit down  in the shower, use a plastic, non-slip stool. Keep the floor dry. Clean up any water that spills on the floor as soon as it happens. Remove soap buildup in the tub or shower regularly. Attach bath mats securely with double-sided non-slip rug tape. Do not have throw rugs and other things on the floor that can make you trip. What can I do in the bedroom? Use night lights. Make sure that you have a light by your bed that is easy to reach. Do not use any sheets or blankets that are too big for your bed. They should not hang down onto the floor. Have a firm chair that has side arms. You can use this for support while you get dressed. Do not have throw rugs and other things on the floor that can make you trip. What can I do in the kitchen? Clean up any spills right away. Avoid walking on wet floors. Keep items that you use a lot in easy-to-reach places. If you need to reach something above you, use a strong step stool that has a grab bar. Keep electrical cords out of the way. Do not use floor polish or wax that makes floors slippery. If you must use wax, use non-skid floor wax. Do not have throw rugs and other things on the floor that can make you trip. What can I do with my stairs? Do not leave any items on the  stairs. Make sure that there are handrails on both sides of the stairs and use them. Fix handrails that are broken or loose. Make sure that handrails are as long as the stairways. Check any carpeting to make sure that it is firmly attached to the stairs. Fix any carpet that is loose or worn. Avoid having throw rugs at the top or bottom of the stairs. If you do have throw rugs, attach them to the floor with carpet tape. Make sure that you have a light switch at the top of the stairs and the bottom of the stairs. If you do not have them, ask someone to add them for you. What else can I do to help prevent falls? Wear shoes that: Do not have high heels. Have rubber bottoms. Are comfortable  and fit you well. Are closed at the toe. Do not wear sandals. If you use a stepladder: Make sure that it is fully opened. Do not climb a closed stepladder. Make sure that both sides of the stepladder are locked into place. Ask someone to hold it for you, if possible. Clearly mark and make sure that you can see: Any grab bars or handrails. First and last steps. Where the edge of each step is. Use tools that help you move around (mobility aids) if they are needed. These include: Canes. Walkers. Scooters. Crutches. Turn on the lights when you go into a dark area. Replace any light bulbs as soon as they burn out. Set up your furniture so you have a clear path. Avoid moving your furniture around. If any of your floors are uneven, fix them. If there are any pets around you, be aware of where they are. Review your medicines with your doctor. Some medicines can make you feel dizzy. This can increase your chance of falling. Ask your doctor what other things that you can do to help prevent falls. This information is not intended to replace advice given to you by your health care provider. Make sure you discuss any questions you have with your health care provider. Document Released: 12/05/2008 Document Revised: 07/17/2015 Document Reviewed: 03/15/2014 Elsevier Interactive Patient Education  2017 [employer].

## 2021-04-20 ENCOUNTER — Telehealth: Payer: Self-pay

## 2021-04-20 MED ORDER — ROSUVASTATIN CALCIUM 5 MG PO TABS: ORAL_TABLET | ORAL | 2 refills | Status: DC

## 2021-04-20 NOTE — Telephone Encounter (Signed)
Pharmacy faxed in refill request for  rosuvastatin (CRESTOR) 5 MG tablet [[id number]]    Order Details Dose, Route, Frequency: As Directed  Dispense Quantity: 12 tablet Refills: 2        Sig: TAKE 1 TABLET BY MOUTH ONCE A WEEK FOR CHOLESTEROL

## 2021-04-20 NOTE — Telephone Encounter (Signed)
Refill sent to pharmacy.   

## 2021-05-11 ENCOUNTER — Other Ambulatory Visit: Payer: Self-pay | Admitting: Surgery

## 2021-05-11 DIAGNOSIS — D128 Benign neoplasm of rectum: Secondary | ICD-10-CM | POA: Diagnosis not present

## 2021-05-11 DIAGNOSIS — K6289 Other specified diseases of anus and rectum: Secondary | ICD-10-CM | POA: Diagnosis not present

## 2021-05-11 DIAGNOSIS — C20 Malignant neoplasm of rectum: Secondary | ICD-10-CM | POA: Diagnosis not present

## 2021-06-15 DIAGNOSIS — H401133 Primary open-angle glaucoma, bilateral, severe stage: Secondary | ICD-10-CM | POA: Diagnosis not present

## 2021-06-15 DIAGNOSIS — H524 Presbyopia: Secondary | ICD-10-CM | POA: Diagnosis not present

## 2021-06-15 DIAGNOSIS — Z961 Presence of intraocular lens: Secondary | ICD-10-CM | POA: Diagnosis not present

## 2021-08-31 ENCOUNTER — Ambulatory Visit (INDEPENDENT_AMBULATORY_CARE_PROVIDER_SITE_OTHER): Payer: Medicare Other | Admitting: Family Medicine

## 2021-08-31 ENCOUNTER — Telehealth: Payer: Self-pay

## 2021-08-31 VITALS — BP 180/90 | HR 73 | Temp 97.9°F | Ht 69.0 in | Wt 169.0 lb

## 2021-08-31 DIAGNOSIS — C20 Malignant neoplasm of rectum: Secondary | ICD-10-CM

## 2021-08-31 DIAGNOSIS — I10 Essential (primary) hypertension: Secondary | ICD-10-CM

## 2021-08-31 DIAGNOSIS — R739 Hyperglycemia, unspecified: Secondary | ICD-10-CM | POA: Diagnosis not present

## 2021-08-31 DIAGNOSIS — R911 Solitary pulmonary nodule: Secondary | ICD-10-CM | POA: Diagnosis not present

## 2021-08-31 MED ORDER — LISINOPRIL 40 MG PO TABS: 40.0000 mg | ORAL_TABLET | Freq: Every day | ORAL | 3 refills | Status: DC

## 2021-08-31 NOTE — Telephone Encounter (Signed)
Spoke with pt's wife, wife told pt start lisinopril 40 mg poqday and recheck bp in 2 weeks per Dr. Dennard Schaumann. Pt voiced understanding. Nothing at this time.

## 2021-08-31 NOTE — Progress Notes (Signed)
Subjective:    Patient ID: Ryan Mosley, male    DOB: Jan 16, 1941, 81 y.o.   MRN: [id number]  HPI 9/22 Patient was admitted to the hospital in May with a GI bleed.  Colonoscopy revealed rectal cancer.  He recently underwent resection for the rectal cancer.  Staging was T3N0.  Margins were clear on the biopsy and lymph nodes were negative.  However during his hospitalization a CT scan of the chest showed a mass in the left lung.  Results are included below: IMPRESSION: 1. 1.4 by 1.1 cm sub solid nodule in the superior segment left lower lobe. This is not typical for metastatic disease but could be inflammatory or conceivably low-grade adenocarcinoma. There is also a 3 mm left lower lobe nodule. Initial follow-up with CT at 6-12 months is recommended to confirm persistence. If persistent, repeat CT is recommended every 2 years until 5 years of stability has been established. This recommendation follows the consensus statement: Guidelines for Management of Incidental Pulmonary Nodules Detected on CT Images: From the Fleischner Society 2017; Radiology 2017; 284:228-243. 2. Low-density blood pool compatible with anemia. 3.  Aortic Atherosclerosis (ICD10-I70.0). 4.  Emphysema (ICD10-J43.9). 5. Thoracic spondylosis.  Patient is here today to discuss this.  I recommended that we perform either a PET scan or a CT scan or consult pulmonology regarding the mass in his lung.  The patient states that he is almost 81 years old and he does not want to undergo any more work-up.  He wants to wait for now.  I explained to him that if we wait and this is cancer, it could likely grow and spread and then be incurable when he does want to pursue it further.  The patient states that he understands this however he has no desire for any additional testing.  03/02/21 Patient is here today for follow-up.  He has a history of invasive adenocarcinoma of the rectum.  This is being followed by his general surgeon and his  gastroenterologist.  He is here today to follow-up his hypertension.  His blood pressure is well controlled.  Also discussed with him the CAT scan that he had in May.  I explained to him that the nodule in his lung may be cancerous.  They recommended repeating a CAT scan in 6 to 12 months to monitor this.  The patient declines further testing.  He states that he wants to wait until he has symptoms.  I explained to him that it is cancer, and we wait until symptoms arise, it would likely be too late to do any treatment.  Patient seems confused.  He was not aware that he had cancer in his rectum.  I again discussed this with him and also recommended repeating a CAT scan of his lung to monitor the nodule.  He defers this at the present time.  At that time, my plan was:  As I did at his last appointment I recommended repeating at least a CAT scan to monitor the lesion in his lung.  He refuses.  I explained to him that waiting until he has symptoms would likely be too late to treat any cancer that he had.  He still declines at the present time.  His blood pressure today is acceptable.  I will check a CBC to monitor for any anemia given his history of GI bleeding, CMP and a lipid panel.  His goal LDL cholesterol is less than 100.  I explained to the patient that I would be  happy to repeat a CAT scan at any time of his lungs and that this would be my recommendation.  He politely defers at the present time.  08/31/21  I have copied part of the office note from his general surgeon in March: "Before I could explain to him my recommendations he stood up and said I had never told him about the biopsy results. I tried to clarify as I noted the path on this will come back in a week. Best I can gather he was stating I never told him if he had cancer or not. He recalls his daughter getting a message saying it was negative. I noted I had told him multiple times & printed out reports saying he had cancer. He started arguing &  eventually shouting at me. Unfortunately I started talking more sternly and eventually shouting back some in an effort to match his intensity and try and calm the conversation back down. I noted that I had given him copies of pathology reports from my surgery 6 months ago and explained to him multiple times and again restated he definitely has rectal cancer. He then stated the first time I saw him that I apparently stood in the doorway for 30 seconds and he could tell that I thought he was garbage. I do not recall standing in the doorway and I noted that I try and take care I have everyone equally well & to the best of my ability. He then stated that someone called his daughter and said the biopsy was negative and what was out about. I asked for clarification. He remembers saying that rectal cancer cannot be cured and remembers me arguing with him. I noted I tried to explain to him that he definitely had options and I worried that the cancer was going to come back without further treatment. I tried to plead to his wife and tried to explain to him that I had given my advice and he was going against that. I worry about local recurrence and the pain and problems that that can cause. I noted that It did not make sense to me that he states that he does not trust me and thinks I do not care for him; yet, he keeps coming to appointments. He stated he knew I did not care about him and he left. I updated the office CEO, Mr Janalyn Harder, of the event."  Patient is here today with his wife.  His blood pressure is extremely high.  I went over his medication list with him and I am very confused.  He states that he is only taking 1 medicine that is 5 mg.  He denies taking lisinopril.  However at other times he mentions taking a cholesterol pill.  However, it sounds like he is stopping lisinopril at some point.  He denies any chest pain shortness of breath or dyspnea on exertion.  He denies any hemoptysis or fevers or chills.  I went  over the results of his biopsy from March.  I explained that it did show recurrence of his cancer in his rectal area.  I also reported over the surgeon's recommendations about a wider excision versus referral to oncology to discuss chemotherapy or local radiation treatment.  Patient refuses all of this at this time.  I asked him to consider it more in depth and also discussed the situation was with his wife who was in the room with him however at the present time he declines any further treatment.  He states that none of these treatments work.  I also discussed the spot in his lung.  Again I recommended a CT scan.  However the patient refuses this as well.  Past Medical History:  Diagnosis Date   Acute blood loss anemia    on meds   Blood transfusion without reported diagnosis 2022   Cataract    bilateral sx   Controlled diabetes mellitus type 2 with complications ([place])    Diabetes mellitus without complication ([place])    not on meds at this time   GERD (gastroesophageal reflux disease)    hx of   GI bleed    Hyperlipidemia    on meds   Hypertension    on meds   Meningitis 1960/1970   Pneumonia 1960/1970   Rectal polyp    Past Surgical History:  Procedure Laterality Date   BIOPSY  07/08/2020   Procedure: BIOPSY;  Surgeon: Thornton Park, MD;  Location: [place];  Service: Gastroenterology;;   COLONOSCOPY WITH PROPOFOL N/A 07/08/2020   Procedure: COLONOSCOPY WITH PROPOFOL;  Surgeon: Thornton Park, MD;  Location: [facility];  Service: Gastroenterology;  Laterality: N/A;   DENTAL SURGERY     all teeth removed   XI ROBOT ASSISTED TRANSANAL RESECTION N/A 09/25/2020   Procedure: TAMIS PARTIAL PROCTECTOMY OF RECTAL MASS;  Surgeon: Michael Boston, MD;  Location: [facility];  Service: General;  Laterality: N/A;   Current Outpatient Medications on File Prior to Visit  Medication Sig Dispense Refill   acetaminophen (TYLENOL) 325 MG tablet Take 650 mg by mouth daily.     amLODipine  (NORVASC) 5 MG tablet TAKE 1 TABLET BY MOUTH EVERY DAY FOR BLOOD PRESSURE 90 tablet 2   ferrous sulfate 325 (65 FE) MG EC tablet Take 1 tablet (325 mg total) by mouth 2 (two) times daily. 60 tablet 3   FLUZONE HIGH-DOSE QUADRIVALENT 0.7 ML SUSY      latanoprost (XALATAN) 0.005 % ophthalmic solution Place 1 drop into both eyes at bedtime.  3   lisinopril (ZESTRIL) 40 MG tablet Take 1 tablet (40 mg total) by mouth daily. 90 tablet 3   Pseudoephedrine-Ibuprofen (ADVIL COLD/SINUS PO) Take 1 tablet by mouth daily as needed (sinus relief).     rosuvastatin (CRESTOR) 5 MG tablet TAKE 1 TABLET BY MOUTH ONCE A WEEK FOR CHOLESTEROL 12 tablet 2   No current facility-administered medications on file prior to visit.   No Known Allergies Social History   Socioeconomic History   Marital status: Married    Spouse name: Corene   Number of children: Not on file   Years of education: Not on file   Highest education level: Not on file  Occupational History   Not on file  Tobacco Use   Smoking status: Former   Smokeless tobacco: Current    Types: Chew  Vaping Use   Vaping Use: Never used  Substance and Sexual Activity   Alcohol use: Not Currently    Alcohol/week: 1.0 standard drink of alcohol    Types: 1 Standard drinks or equivalent per week   Drug use: Never   Sexual activity: Yes  Other Topics Concern   Not on file  Social History Narrative   Not on file   Social Determinants of Health   Financial Resource Strain: Low Risk  (03/26/2021)   Overall Financial Resource Strain (CARDIA)    Difficulty of Paying Living Expenses: Not hard at all  Food Insecurity: No Food Insecurity (03/26/2021)   Hunger Vital Sign  Worried About Charity fundraiser in the Last Year: Never true    [place] in the Last Year: Never true  Transportation Needs: No Transportation Needs (03/26/2021)   PRAPARE - Hydrologist (Medical): No    Lack of Transportation (Non-Medical): No   Physical Activity: Sufficiently Active (03/26/2021)   Exercise Vital Sign    Days of Exercise per Week: 5 days    Minutes of Exercise per Session: 30 min  Stress: No Stress Concern Present (03/26/2021)   [place]    Feeling of Stress : Not at all  Social Connections: Moderately Isolated (03/26/2021)   Social Connection and Isolation Panel [NHANES]    Frequency of Communication with Friends and Family: More than three times a week    Frequency of Social Gatherings with Friends and Family: More than three times a week    Attends Religious Services: Never    Marine scientist or Organizations: No    Attends Archivist Meetings: Never    Marital Status: Married  Human resources officer Violence: Not At Risk (03/26/2021)   Humiliation, Afraid, Rape, and Kick questionnaire    Fear of Current or Ex-Partner: No    Emotionally Abused: No    Physically Abused: No    Sexually Abused: No     Review of Systems  All other systems reviewed and are negative.      Objective:   Physical Exam Vitals reviewed.  Constitutional:      Appearance: Normal appearance. He is normal weight.  Cardiovascular:     Rate and Rhythm: Normal rate and regular rhythm.     Heart sounds: Normal heart sounds. No murmur heard.    No gallop.  Pulmonary:     Effort: Pulmonary effort is normal. No respiratory distress.     Breath sounds: Normal breath sounds. No wheezing or rhonchi.  Abdominal:     General: Bowel sounds are normal. There is no distension.     Palpations: Abdomen is soft.     Tenderness: There is no abdominal tenderness. There is no guarding.  Musculoskeletal:     Right lower leg: No edema.     Left lower leg: No edema.  Neurological:     Mental Status: He is alert.           Assessment & Plan:  Benign essential HTN - Plan: CBC with Differential/Platelet, Lipid panel, COMPLETE METABOLIC PANEL WITH GFR  Rectal cancer  (HCC)  Pulmonary nodule 1 cm or greater in diameter I discussed the recurrence of his rectal cancer.  I offered the patient a referral to oncology which he declined.  Offered the patient a referral to general surgery which he declined.  I discussed this at length with both he and his wife but at the present time, the patient refuses any further treatment or referrals.  Also recommended a CT scan of the lung however the patient refuses this as well.  I explained that if the cancer spreading, it could reach a point where he could die from the cancer however the patient refuses any treatment at this time.  I will add lisinopril 40 mg a day back to amlodipine.  I asked the patient to recheck in 2 weeks his blood pressure here at an office visit

## 2021-08-31 NOTE — Telephone Encounter (Signed)
Pt came in to provide the list of meds that he is currently taking:  Started taking these today: Lisinopril 40 mg       Rosuvastatin 5 mg Amlodipine  5 mg latanoprost 5 mg  Cb#: [phone]

## 2021-09-03 LAB — CBC WITH DIFFERENTIAL/PLATELET
Absolute Monocytes: 614 {cells}/uL (ref 200–950)
Basophils Absolute: 18 {cells}/uL (ref 0–200)
Basophils Relative: 0.3 %
Eosinophils Absolute: 118 {cells}/uL (ref 15–500)
Eosinophils Relative: 2 %
HCT: 48 % (ref 38.5–50.0)
Hemoglobin: 16 g/dL (ref 13.2–17.1)
Lymphs Abs: 1776 {cells}/uL (ref 850–3900)
MCH: 30.9 pg (ref 27.0–33.0)
MCHC: 33.3 g/dL (ref 32.0–36.0)
MCV: 92.8 fL (ref 80.0–100.0)
MPV: 9.9 fL (ref 7.5–12.5)
Monocytes Relative: 10.4 %
Neutro Abs: 3375 {cells}/uL (ref 1500–7800)
Neutrophils Relative %: 57.2 %
Platelets: 224 10*3/uL (ref 140–400)
RBC: 5.17 Million/uL (ref 4.20–5.80)
RDW: 12.1 % (ref 11.0–15.0)
Total Lymphocyte: 30.1 %
WBC: 5.9 10*3/uL (ref 3.8–10.8)

## 2021-09-03 LAB — LIPID PANEL
Cholesterol: 198 mg/dL (ref <200)
HDL: 38 mg/dL — ABNORMAL LOW (ref >=40)
LDL Cholesterol (Calc): 137 mg/dL (calc) — ABNORMAL HIGH
Non-HDL Cholesterol (Calc): 160 mg/dL (calc) — ABNORMAL HIGH (ref <130)
Total CHOL/HDL Ratio: 5.2 (calc) — ABNORMAL HIGH (ref <5.0)
Triglycerides: 116 mg/dL (ref <150)

## 2021-09-03 LAB — HEMOGLOBIN A1C W/OUT EAG: Hgb A1c MFr Bld: 6.5 % of total Hgb — ABNORMAL HIGH (ref <5.7)

## 2021-09-03 LAB — COMPLETE METABOLIC PANEL WITH GFR
AG Ratio: 1.5 (calc) (ref 1.0–2.5)
ALT: 17 U/L (ref 9–46)
AST: 15 U/L (ref 10–35)
Albumin: 4.5 g/dL (ref 3.6–5.1)
Alkaline phosphatase (APISO): 72 U/L (ref 35–144)
BUN: 13 mg/dL (ref 7–25)
CO2: 26 mmol/L (ref 20–32)
Calcium: 9.2 mg/dL (ref 8.6–10.3)
Chloride: 105 mmol/L (ref 98–110)
Creat: 0.96 mg/dL (ref 0.70–1.22)
Globulin: 3 g/dL (calc) (ref 1.9–3.7)
Glucose, Bld: 138 mg/dL — ABNORMAL HIGH (ref 65–99)
Potassium: 4.3 mmol/L (ref 3.5–5.3)
Sodium: 139 mmol/L (ref 135–146)
Total Bilirubin: 0.4 mg/dL (ref 0.2–1.2)
Total Protein: 7.5 g/dL (ref 6.1–8.1)
eGFR: 80 mL/min/{1.73_m2} (ref >=60)

## 2021-09-03 LAB — TEST AUTHORIZATION

## 2021-09-09 ENCOUNTER — Telehealth: Payer: Self-pay | Admitting: Pharmacist

## 2021-09-09 NOTE — Chronic Care Management (AMB) (Signed)
    Chronic Care Management Pharmacy Assistant   Name: Ryan Mosley  MRN: [id number] DOB: October 12, 1940   Reason for Encounter: Disease State - Hypertension Call    Recent office visits:  08/31/21 Jenna Luo MD - Family Medicine - Hypertension - Labs were ordered. ADDED lisinopril (ZESTRIL) 40 MG tablet prescribed. Follow up in 2 weeks.   Recent consult visits:  05/11/21 Michael Boston - Oncology - Rectal CA - Patient declined treatment per MD notes patient became very upset and left appt and no follow up was made.   Hospital visits:  None in previous 6 months  Medications: Outpatient Encounter Medications as of 09/09/2021  Medication Sig   acetaminophen (TYLENOL) 325 MG tablet Take 650 mg by mouth daily.   amLODipine (NORVASC) 5 MG tablet TAKE 1 TABLET BY MOUTH EVERY DAY FOR BLOOD PRESSURE   ferrous sulfate 325 (65 FE) MG EC tablet Take 1 tablet (325 mg total) by mouth 2 (two) times daily.   FLUZONE HIGH-DOSE QUADRIVALENT 0.7 ML SUSY    latanoprost (XALATAN) 0.005 % ophthalmic solution Place 1 drop into both eyes at bedtime.   lisinopril (ZESTRIL) 40 MG tablet Take 1 tablet (40 mg total) by mouth daily.   lisinopril (ZESTRIL) 40 MG tablet Take 1 tablet (40 mg total) by mouth daily.   Pseudoephedrine-Ibuprofen (ADVIL COLD/SINUS PO) Take 1 tablet by mouth daily as needed (sinus relief).   rosuvastatin (CRESTOR) 5 MG tablet TAKE 1 TABLET BY MOUTH ONCE A WEEK FOR CHOLESTEROL   No facility-administered encounter medications on file as of 09/09/2021.    Current antihypertensive regimen:  amLODipine (NORVASC) 5 MG tablet  lisinopril (ZESTRIL) 40 MG tablet   How often are you checking your Blood Pressure?  Patient reported he not currently checking blood pressures    Current home BP readings: wife reported patient is not currently taking his blood pressures.   What recent interventions/DTPs have been made by any provider to improve Blood Pressure control since last CPP Visit:   Patient was added on Lisinopril 40 mg in addition to his Amlodipine on 08/31/21.   Any recent hospitalizations or ED visits since last visit with CPP?  Patient has not had any hospitalizations or ED visits since last visit with CPP   What diet changes have been made to improve Blood Pressure Control?  Patients wife reported patient tries to watch his salt intake.    What exercise is being done to improve your Blood Pressure Control?  Patients wife reported he remains pretty active.    Adherence Review: Is the patient currently on ACE/ARB medication? Yes Does the patient have >5 day gap between last estimated fill dates? No   Care Gaps  AWV: done 03/26/21 Colonoscopy: done 07/08/20 DM Eye Exam:  due 02/24/21 DM Foot Exam: due 03/17/21 Microalbumin: done 09/17/19 HbgAIC: done 08/31/21 (6.5) DEXA:  N/A Mammogram: N/A   Star Rating Drugs: amLODipine (NORVASC) 5 MG tablet - last filled 06/16/21 90 days  lisinopril (ZESTRIL) 40 MG tablet - last filled 08/31/21 90 days  rosuvastatin (CRESTOR) 5 MG tablet - last filled 06/25/21 84 days    Future Appointments  Date Time Provider [place]  09/15/2021  8:00 AM [facility]  09/22/2021  8:15 AM Susy Frizzle, MD [facility]  04/01/2022 11:15 AM [facility]NURSE HEALTH ADVISOR [facility] [place], [facility] Clinical Pharmacist Assistant  [phone]

## 2021-09-14 DIAGNOSIS — H401133 Primary open-angle glaucoma, bilateral, severe stage: Secondary | ICD-10-CM | POA: Diagnosis not present

## 2021-09-15 ENCOUNTER — Other Ambulatory Visit: Payer: Medicare Other

## 2021-09-15 DIAGNOSIS — K922 Gastrointestinal hemorrhage, unspecified: Secondary | ICD-10-CM

## 2021-09-15 DIAGNOSIS — I10 Essential (primary) hypertension: Secondary | ICD-10-CM

## 2021-09-15 DIAGNOSIS — E119 Type 2 diabetes mellitus without complications: Secondary | ICD-10-CM

## 2021-09-18 ENCOUNTER — Other Ambulatory Visit: Payer: Self-pay | Admitting: Family Medicine

## 2021-09-18 NOTE — Telephone Encounter (Signed)
Appointment 09/22/21 Courtesy #[phone] given Requested Prescriptions  Pending Prescriptions Disp Refills  . amLODipine (NORVASC) 5 MG tablet [Pharmacy Med Name: AMLODIPINE BESYLATE 5 MG TAB] 90 tablet 2    Sig: TAKE 1 TABLET BY MOUTH EVERY DAY FOR BLOOD PRESSURE     Cardiovascular: Calcium Channel Blockers 2 Failed - 09/18/2021  2:29 AM      Failed - Last BP in normal range    BP Readings from Last 1 Encounters:  08/31/21 (!) 180/90         Failed - Valid encounter within last 6 months    Recent Outpatient Visits          6 months ago Benign essential HTN   [place] Susy Frizzle, MD   10 months ago Pulmonary nodule 1 cm or greater in diameter   [place], Cammie Mcgee, MD   1 year ago Gastrointestinal hemorrhage, unspecified gastrointestinal hemorrhage type   [place] Susy Frizzle, MD   1 year ago Routine general medical examination at a health care facility   [place], Modena Nunnery, MD   2 years ago Essential hypertension   [place], [place] Nunnery, MD      Future Appointments            In 4 days Dennard Schaumann, Cammie Mcgee, MD [place], [facility]           Passed - Last Heart Rate in normal range    Pulse Readings from Last 1 Encounters:  08/31/21 73

## 2021-09-22 ENCOUNTER — Ambulatory Visit: Payer: Medicare Other | Admitting: Family Medicine

## 2021-10-12 ENCOUNTER — Ambulatory Visit (INDEPENDENT_AMBULATORY_CARE_PROVIDER_SITE_OTHER): Payer: Medicare Other | Admitting: Family Medicine

## 2021-10-12 VITALS — BP 180/78 | HR 57 | Temp 97.7°F | Ht 69.0 in | Wt 168.0 lb

## 2021-10-12 DIAGNOSIS — I10 Essential (primary) hypertension: Secondary | ICD-10-CM | POA: Diagnosis not present

## 2021-10-12 MED ORDER — HYDROCHLOROTHIAZIDE 25 MG PO TABS: 25.0000 mg | ORAL_TABLET | Freq: Every day | ORAL | 3 refills | Status: DC

## 2021-10-12 NOTE — Progress Notes (Signed)
Subjective:    Patient ID: Ryan Mosley, male    DOB: 1940-12-01, 81 y.o.   MRN: [id number]  HPI 9/22 Patient was admitted to the hospital in May with a GI bleed.  Colonoscopy revealed rectal cancer.  He recently underwent resection for the rectal cancer.  Staging was T3N0.  Margins were clear on the biopsy and lymph nodes were negative.  However during his hospitalization a CT scan of the chest showed a mass in the left lung.  Results are included below: IMPRESSION: 1. 1.4 by 1.1 cm sub solid nodule in the superior segment left lower lobe. This is not typical for metastatic disease but could be inflammatory or conceivably low-grade adenocarcinoma. There is also a 3 mm left lower lobe nodule. Initial follow-up with CT at 6-12 months is recommended to confirm persistence. If persistent, repeat CT is recommended every 2 years until 5 years of stability has been established. This recommendation follows the consensus statement: Guidelines for Management of Incidental Pulmonary Nodules Detected on CT Images: From the Fleischner Society 2017; Radiology 2017; 284:228-243. 2. Low-density blood pool compatible with anemia. 3.  Aortic Atherosclerosis (ICD10-I70.0). 4.  Emphysema (ICD10-J43.9). 5. Thoracic spondylosis.  Patient is here today to discuss this.  I recommended that we perform either a PET scan or a CT scan or consult pulmonology regarding the mass in his lung.  The patient states that he is almost 81 years old and he does not want to undergo any more work-up.  He wants to wait for now.  I explained to him that if we wait and this is cancer, it could likely grow and spread and then be incurable when he does want to pursue it further.  The patient states that he understands this however he has no desire for any additional testing.  03/02/21 Patient is here today for follow-up.  He has a history of invasive adenocarcinoma of the rectum.  This is being followed by his general surgeon and his  gastroenterologist.  He is here today to follow-up his hypertension.  His blood pressure is well controlled.  Also discussed with him the CAT scan that he had in May.  I explained to him that the nodule in his lung may be cancerous.  They recommended repeating a CAT scan in 6 to 12 months to monitor this.  The patient declines further testing.  He states that he wants to wait until he has symptoms.  I explained to him that it is cancer, and we wait until symptoms arise, it would likely be too late to do any treatment.  Patient seems confused.  He was not aware that he had cancer in his rectum.  I again discussed this with him and also recommended repeating a CAT scan of his lung to monitor the nodule.  He defers this at the present time.  At that time, my plan was:  As I did at his last appointment I recommended repeating at least a CAT scan to monitor the lesion in his lung.  He refuses.  I explained to him that waiting until he has symptoms would likely be too late to treat any cancer that he had.  He still declines at the present time.  His blood pressure today is acceptable.  I will check a CBC to monitor for any anemia given his history of GI bleeding, CMP and a lipid panel.  His goal LDL cholesterol is less than 100.  I explained to the patient that I would be  happy to repeat a CAT scan at any time of his lungs and that this would be my recommendation.  He politely defers at the present time.  08/31/21  I have copied part of the office note from his general surgeon in March: "Before I could explain to him my recommendations he stood up and said I had never told him about the biopsy results. I tried to clarify as I noted the path on this will come back in a week. Best I can gather he was stating I never told him if he had cancer or not. He recalls his daughter getting a message saying it was negative. I noted I had told him multiple times & printed out reports saying he had cancer. He started arguing &  eventually shouting at me. Unfortunately I started talking more sternly and eventually shouting back some in an effort to match his intensity and try and calm the conversation back down. I noted that I had given him copies of pathology reports from my surgery 6 months ago and explained to him multiple times and again restated he definitely has rectal cancer. He then stated the first time I saw him that I apparently stood in the doorway for 30 seconds and he could tell that I thought he was garbage. I do not recall standing in the doorway and I noted that I try and take care I have everyone equally well & to the best of my ability. He then stated that someone called his daughter and said the biopsy was negative and what was out about. I asked for clarification. He remembers saying that rectal cancer cannot be cured and remembers me arguing with him. I noted I tried to explain to him that he definitely had options and I worried that the cancer was going to come back without further treatment. I tried to plead to his wife and tried to explain to him that I had given my advice and he was going against that. I worry about local recurrence and the pain and problems that that can cause. I noted that It did not make sense to me that he states that he does not trust me and thinks I do not care for him; yet, he keeps coming to appointments. He stated he knew I did not care about him and he left. I updated the office CEO, Mr Janalyn Harder, of the event."  Patient is here today with his wife.  His blood pressure is extremely high.  I went over his medication list with him and I am very confused.  He states that he is only taking 1 medicine that is 5 mg.  He denies taking lisinopril.  However at other times he mentions taking a cholesterol pill.  However, it sounds like he is stopping lisinopril at some point.  He denies any chest pain shortness of breath or dyspnea on exertion.  He denies any hemoptysis or fevers or chills.  I went  over the results of his biopsy from March.  I explained that it did show recurrence of his cancer in his rectal area.  I also reported over the surgeon's recommendations about a wider excision versus referral to oncology to discuss chemotherapy or local radiation treatment.  Patient refuses all of this at this time.  I asked him to consider it more in depth and also discussed the situation was with his wife who was in the room with him however at the present time he declines any further treatment.  He states that none of these treatments work.  I also discussed the spot in his lung.  Again I recommended a CT scan.  However the patient refuses this as well.  At that time, my plan was: I discussed the recurrence of his rectal cancer.  I offered the patient a referral to oncology which he declined.  Offered the patient a referral to general surgery which he declined.  I discussed this at length with both he and his wife but at the present time, the patient refuses any further treatment or referrals.  Also recommended a CT scan of the lung however the patient refuses this as well.  I explained that if the cancer spreading, it could reach a point where he could die from the cancer however the patient refuses any treatment at this time.  I will add lisinopril 40 mg a day back to amlodipine.  I asked the patient to recheck in 2 weeks his blood pressure here at an office visit  10/12/21  Patient continues to present with a treatment challenge.  His blood pressure is extremely high today.  He is taking amlodipine and lisinopril.  However he is also taking Advil Cold and Sinus which has Sudafed in it.  I explained that the Sudafed is likely raising his blood pressure.  However he states that is the only medication that works to "clear his head up".  He states he needs a shot and antibiotic.  He states that he has had head congestion for years.  Antibiotics is the only thing that will help.  He is requesting a steroid shot  today.  He states that this is a constant symptom for him even though this is the first time he mentions it to me.  He states that his last doctor went and given a shot of antibiotics.  He refuses to take any allergy medication.  He refuses to take Claritin, Allegra, Zyrtec.  He refuses to take Flonase.  He states that the none of those work.  He denies any fevers or chills. Past Medical History:  Diagnosis Date   Acute blood loss anemia    on meds   Blood transfusion without reported diagnosis 2022   Cataract    bilateral sx   Controlled diabetes mellitus type 2 with complications ([place])    Diabetes mellitus without complication ([place])    not on meds at this time   GERD (gastroesophageal reflux disease)    hx of   GI bleed    Hyperlipidemia    on meds   Hypertension    on meds   Meningitis 1960/1970   Pneumonia 1960/1970   Rectal polyp    Past Surgical History:  Procedure Laterality Date   BIOPSY  07/08/2020   Procedure: BIOPSY;  Surgeon: Thornton Park, MD;  Location: [facility];  Service: Gastroenterology;;   COLONOSCOPY WITH PROPOFOL N/A 07/08/2020   Procedure: COLONOSCOPY WITH PROPOFOL;  Surgeon: Thornton Park, MD;  Location: [facility];  Service: Gastroenterology;  Laterality: N/A;   DENTAL SURGERY     all teeth removed   XI ROBOT ASSISTED TRANSANAL RESECTION N/A 09/25/2020   Procedure: TAMIS PARTIAL PROCTECTOMY OF RECTAL MASS;  Surgeon: Michael Boston, MD;  Location: [facility];  Service: General;  Laterality: N/A;   Current Outpatient Medications on File Prior to Visit  Medication Sig Dispense Refill   acetaminophen (TYLENOL) 325 MG tablet Take 650 mg by mouth daily.     amLODipine (NORVASC) 5 MG tablet TAKE 1 TABLET  BY MOUTH EVERY DAY FOR BLOOD PRESSURE 90 tablet 2   ferrous sulfate 325 (65 FE) MG EC tablet Take 1 tablet (325 mg total) by mouth 2 (two) times daily. 60 tablet 3   FLUZONE HIGH-DOSE QUADRIVALENT 0.7 ML SUSY      latanoprost (XALATAN) 0.005 % ophthalmic  solution Place 1 drop into both eyes at bedtime.  3   lisinopril (ZESTRIL) 40 MG tablet Take 1 tablet (40 mg total) by mouth daily. 90 tablet 3   lisinopril (ZESTRIL) 40 MG tablet Take 1 tablet (40 mg total) by mouth daily. 90 tablet 3   Pseudoephedrine-Ibuprofen (ADVIL COLD/SINUS PO) Take 1 tablet by mouth daily as needed (sinus relief).     rosuvastatin (CRESTOR) 5 MG tablet TAKE 1 TABLET BY MOUTH ONCE A WEEK FOR CHOLESTEROL 12 tablet 2   No current facility-administered medications on file prior to visit.   No Known Allergies Social History   Socioeconomic History   Marital status: Married    Spouse name: Corene   Number of children: Not on file   Years of education: Not on file   Highest education level: Not on file  Occupational History   Not on file  Tobacco Use   Smoking status: Former   Smokeless tobacco: Current    Types: Chew  Vaping Use   Vaping Use: Never used  Substance and Sexual Activity   Alcohol use: Not Currently    Alcohol/week: 1.0 standard drink of alcohol    Types: 1 Standard drinks or equivalent per week   Drug use: Never   Sexual activity: Yes  Other Topics Concern   Not on file  Social History Narrative   Not on file   Social Determinants of Health   Financial Resource Strain: Low Risk  (03/26/2021)   Overall Financial Resource Strain (CARDIA)    Difficulty of Paying Living Expenses: Not hard at all  Food Insecurity: No Food Insecurity (03/26/2021)   Hunger Vital Sign    Worried About Running Out of Food in the Last Year: Never true    [place] in the Last Year: Never true  Transportation Needs: No Transportation Needs (03/26/2021)   PRAPARE - Hydrologist (Medical): No    Lack of Transportation (Non-Medical): No  Physical Activity: Sufficiently Active (03/26/2021)   Exercise Vital Sign    Days of Exercise per Week: 5 days    Minutes of Exercise per Session: 30 min  Stress: No Stress Concern Present (03/26/2021)    [place]    Feeling of Stress : Not at all  Social Connections: Moderately Isolated (03/26/2021)   Social Connection and Isolation Panel [NHANES]    Frequency of Communication with Friends and Family: More than three times a week    Frequency of Social Gatherings with Friends and Family: More than three times a week    Attends Religious Services: Never    Marine scientist or Organizations: No    Attends Archivist Meetings: Never    Marital Status: Married  Human resources officer Violence: Not At Risk (03/26/2021)   Humiliation, Afraid, Rape, and Kick questionnaire    Fear of Current or Ex-Partner: No    Emotionally Abused: No    Physically Abused: No    Sexually Abused: No     Review of Systems  All other systems reviewed and are negative.      Objective:   Physical  Exam Vitals reviewed.  Constitutional:      Appearance: Normal appearance. He is normal weight.  Cardiovascular:     Rate and Rhythm: Normal rate and regular rhythm.     Heart sounds: Normal heart sounds. No murmur heard.    No gallop.  Pulmonary:     Effort: Pulmonary effort is normal. No respiratory distress.     Breath sounds: Normal breath sounds. No wheezing or rhonchi.  Abdominal:     General: Bowel sounds are normal. There is no distension.     Palpations: Abdomen is soft.     Tenderness: There is no abdominal tenderness. There is no guarding.  Musculoskeletal:     Right lower leg: No edema.     Left lower leg: No edema.  Neurological:     Mental Status: He is alert.           Assessment & Plan:  Benign essential HTN I do not feel that the patient has a sinus infection.  I feel that he is likely dealing with allergies he refuses to take allergy medication and he insist on taking Sudafed.  This is increasing his blood pressure further.  I will add hydrochlorothiazide 25 mg a day to his amlodipine and his lisinopril.  I  explained to the patient that I do not feel he needs an antibiotic or steroids at the present time.  I believe that he is dealing with allergies and I would recommend allergy medication however patient refuses this.  Therefore, I will simply try to bring his blood pressure down as best I can.

## 2021-11-19 ENCOUNTER — Telehealth: Payer: Self-pay | Admitting: Family Medicine

## 2021-11-19 NOTE — Telephone Encounter (Signed)
Patient came to office to inform provider he's unable to continue taking hydrochlorothiazide (HYDRODIURIL) 25 MG tablet  due to the side effects he's experiencing.   Patient listed side effects as headache, nose bleeds, dizziness (feels like he's going to fall when he bends over),  and decline in vision.  Patient is currently taking '5mg'$  and 40 mg of a different b/p medication (he doesn't remember the name); states he's been taking that medication for the longest period of time.   LOV: 10/12/21  Pharmacy confirmed as:   [id number] [place], [place] [id number] Phone:  [phone] Fax:  [phone] DEA #:  [id number] Please advise at [phone]

## 2021-12-31 DIAGNOSIS — H401133 Primary open-angle glaucoma, bilateral, severe stage: Secondary | ICD-10-CM | POA: Diagnosis not present

## 2022-01-27 ENCOUNTER — Encounter: Payer: Self-pay | Admitting: Family Medicine

## 2022-01-27 NOTE — Progress Notes (Signed)
[facility] Quality Other; (KED MEASURE) [place] (KIDNEY HEALTH) MEASURE; URINE MICROALBUMIN NEEDED TO CLOSE GAP; PLEASE ADDRESS BEFORE 02/21/2022

## 2022-03-31 NOTE — Progress Notes (Deleted)
Subjective:   Ryan Mosley is a 82 y.o. male who presents for Medicare Annual/Subsequent preventive examination.  Review of Systems    ***       Objective:    There were no vitals filed for this visit. There is no height or weight on file to calculate BMI.     03/26/2021   11:25 AM 10/07/2020    8:36 AM 09/25/2020    6:00 PM 09/16/2020    8:58 AM 08/16/2020   10:33 AM 07/08/2020   12:32 AM 07/07/2020   10:15 AM  Advanced Directives  Does Patient Have a Medical Advance Directive? No No No No No No No  Would patient like information on creating a medical advance directive? No - Patient declined No - Patient declined No - Patient declined No - Patient declined No - Patient declined No - Patient declined Yes (ED - Information included in AVS)    Current Medications (verified) Outpatient Encounter Medications as of 04/01/2022  Medication Sig   acetaminophen (TYLENOL) 325 MG tablet Take 650 mg by mouth daily.   amLODipine (NORVASC) 5 MG tablet TAKE 1 TABLET BY MOUTH EVERY DAY FOR BLOOD PRESSURE   ferrous sulfate 325 (65 FE) MG EC tablet Take 1 tablet (325 mg total) by mouth 2 (two) times daily.   FLUZONE HIGH-DOSE QUADRIVALENT 0.7 ML SUSY    hydrochlorothiazide (HYDRODIURIL) 25 MG tablet Take 1 tablet (25 mg total) by mouth daily.   latanoprost (XALATAN) 0.005 % ophthalmic solution Place 1 drop into both eyes at bedtime.   lisinopril (ZESTRIL) 40 MG tablet Take 1 tablet (40 mg total) by mouth daily.   Pseudoephedrine-Ibuprofen (ADVIL COLD/SINUS PO) Take 1 tablet by mouth daily as needed (sinus relief).   rosuvastatin (CRESTOR) 5 MG tablet TAKE 1 TABLET BY MOUTH ONCE A WEEK FOR CHOLESTEROL   No facility-administered encounter medications on file as of 04/01/2022.    Allergies (verified) Patient has no known allergies.   History: Past Medical History:  Diagnosis Date   Acute blood loss anemia    on meds   Blood transfusion without reported diagnosis 2022   Cataract    bilateral  sx   Controlled diabetes mellitus type 2 with complications ([place])    Diabetes mellitus without complication ([place])    not on meds at this time   GERD (gastroesophageal reflux disease)    hx of   GI bleed    Hyperlipidemia    on meds   Hypertension    on meds   Meningitis 1960/1970   Pneumonia 1960/1970   Rectal polyp    Past Surgical History:  Procedure Laterality Date   BIOPSY  07/08/2020   Procedure: BIOPSY;  Surgeon: Thornton Park, MD;  Location: [facility];  Service: Gastroenterology;;   COLONOSCOPY WITH PROPOFOL N/A 07/08/2020   Procedure: COLONOSCOPY WITH PROPOFOL;  Surgeon: Thornton Park, MD;  Location: [facility];  Service: Gastroenterology;  Laterality: N/A;   DENTAL SURGERY     all teeth removed   XI ROBOT ASSISTED TRANSANAL RESECTION N/A 09/25/2020   Procedure: TAMIS PARTIAL PROCTECTOMY OF RECTAL MASS;  Surgeon: Michael Boston, MD;  Location: [facility];  Service: General;  Laterality: N/A;   Family History  Problem Relation Age of Onset   Seizures Daughter    Diabetes Son    Colon polyps Neg Hx    Colon cancer Neg Hx    Esophageal cancer Neg Hx    Rectal cancer Neg Hx    Stomach cancer Neg Hx  Social History   Socioeconomic History   Marital status: Married    Spouse name: Corene   Number of children: Not on file   Years of education: Not on file   Highest education level: Not on file  Occupational History   Not on file  Tobacco Use   Smoking status: Former   Smokeless tobacco: Current    Types: Chew  Vaping Use   Vaping Use: Never used  Substance and Sexual Activity   Alcohol use: Not Currently    Alcohol/week: 1.0 standard drink of alcohol    Types: 1 Standard drinks or equivalent per week   Drug use: Never   Sexual activity: Yes  Other Topics Concern   Not on file  Social History Narrative   Not on file   Social Determinants of Health   Financial Resource Strain: Low Risk  (03/26/2021)   Overall Financial Resource Strain (CARDIA)     Difficulty of Paying Living Expenses: Not hard at all  Food Insecurity: No Food Insecurity (03/26/2021)   Hunger Vital Sign    Worried About Running Out of Food in the Last Year: Never true    [place] in the Last Year: Never true  Transportation Needs: No Transportation Needs (03/26/2021)   PRAPARE - Hydrologist (Medical): No    Lack of Transportation (Non-Medical): No  Physical Activity: Sufficiently Active (03/26/2021)   Exercise Vital Sign    Days of Exercise per Week: 5 days    Minutes of Exercise per Session: 30 min  Stress: No Stress Concern Present (03/26/2021)   [place]    Feeling of Stress : Not at all  Social Connections: Moderately Isolated (03/26/2021)   Social Connection and Isolation Panel [NHANES]    Frequency of Communication with Friends and Family: More than three times a week    Frequency of Social Gatherings with Friends and Family: More than three times a week    Attends Religious Services: Never    Marine scientist or Organizations: No    Attends Archivist Meetings: Never    Marital Status: Married    Tobacco Counseling Ready to quit: Not Answered Counseling given: Not Answered   Clinical Intake:                 Diabetic?Yes  Nutrition Risk Assessment:  Has the patient had any N/V/D within the last 2 months?  {YES/NO:21197} Does the patient have any non-healing wounds?  {YES/NO:21197} Has the patient had any unintentional weight loss or weight gain?  {YES/NO:21197}  Diabetes:  Is the patient diabetic?  {YES/NO:21197} If diabetic, was a CBG obtained today?  {YES/NO:21197} Did the patient bring in their glucometer from home?  {YES/NO:21197} How often do you monitor your CBG's? ***.   Financial Strains and Diabetes Management:  Are you having any financial strains with the device, your supplies or your medication?  {YES/NO:21197}.  Does the patient want to be seen by Chronic Care Management for management of their diabetes?  {YES/NO:21197} Would the patient like to be referred to a Nutritionist or for Diabetic Management?  {YES/NO:21197}  Diabetic Exams:  {Diabetic Eye Exam:2101801} {Diabetic Foot Exam:2101802}          Activities of Daily Living     No data to display          Patient Care Team: Susy Frizzle, MD as PCP - General (Family Medicine)  Edythe Clarity, [facility] as Pharmacist (Pharmacist) Truitt Merle, MD as Consulting Physician (Hematology) Michael Boston, MD as Consulting Physician (General Surgery) Thornton Park, MD as Consulting Physician (Gastroenterology) Kyung Rudd, MD as Consulting Physician (Radiation Oncology) Truitt Merle, MD as Consulting Physician (Oncology)  Indicate any recent Medical Services you may have received from other than [facility] providers in the past year (date may be approximate).     Assessment:   This is a routine wellness examination for [place].  Hearing/Vision screen No results found.  Dietary issues and exercise activities discussed:     Goals Addressed   None    Depression Screen    10/12/2021    8:05 AM 03/26/2021   11:22 AM 11/20/2020    8:20 AM 03/17/2020    8:23 AM 09/17/2019    8:17 AM 03/19/2019    8:30 AM 09/11/2018    8:51 AM  PHQ 2/9 Scores  PHQ - 2 Score 0 0 0 0 0 0 0  PHQ- 9 Score 0   2  0     Fall Risk    10/12/2021    8:04 AM 03/26/2021   11:25 AM 11/20/2020    8:15 AM 03/17/2020    8:22 AM 09/17/2019    8:17 AM  Fall Risk   Falls in the past year? 0 0 0 1 0  Number falls in past yr: 0 0 0 0   Injury with Fall? 0 0 0 0   Risk for fall due to :  No Fall Risks No Fall Risks No Fall Risks No Fall Risks  Follow up  Falls prevention discussed Falls evaluation completed Falls evaluation completed Falls evaluation completed    FALL RISK PREVENTION PERTAINING TO THE HOME:  Any stairs in or around the home?  {YES/NO:21197} If so, are there any without handrails? {YES/NO:21197} Home free of loose throw rugs in walkways, pet beds, electrical cords, etc? {YES/NO:21197} Adequate lighting in your home to reduce risk of falls? {YES/NO:21197}  ASSISTIVE DEVICES UTILIZED TO PREVENT FALLS:  Life alert? {YES/NO:21197} Use of a cane, walker or w/c? {YES/NO:21197} Grab bars in the bathroom? {YES/NO:21197} Shower chair or bench in shower? {YES/NO:21197} Elevated toilet seat or a handicapped toilet? {YES/NO:21197}  TIMED UP AND GO:  Was the test performed? No . Telephonic visit    Cognitive Function:        Immunizations Immunization History  Administered Date(s) Administered   Fluad Quad(high Dose 65+) 11/17/2019   Influenza, High Dose Seasonal PF 11/21/2016, 12/20/2017, 11/15/2018   Influenza-Unspecified 11/18/2020   Moderna Sars-Covid-2 Vaccination 01/08/2020   PFIZER(Purple Top)SARS-COV-2 Vaccination 05/25/2019, 06/22/2019, 02/19/2021    {TDAP status:2101805}  {Flu Vaccine status:2101806}  {Pneumococcal vaccine status:2101807}  {Covid-19 vaccine status:2101808}  Qualifies for Shingles Vaccine? {YES/NO:21197}  Zostavax completed {YES/NO:21197}  {Shingrix Completed?:2101804}  Screening Tests Health Maintenance  Topic Date Due   DTaP/Tdap/Td (1 - Tdap) Never done   Zoster Vaccines- Shingrix (1 of 2) Never done   Pneumonia Vaccine 4+ Years old (1 of 1 - PCV) Never done   Diabetic kidney evaluation - Urine ACR  09/16/2020   OPHTHALMOLOGY EXAM  02/24/2021   FOOT EXAM  03/17/2021   INFLUENZA VACCINE  09/22/2021   COVID-19 Vaccine (5 - 2023-24 season) 10/23/2021   Medicare Annual Wellness (AWV)  03/26/2022   HEMOGLOBIN A1C  03/03/2022   Diabetic kidney evaluation - eGFR measurement  09/01/2022   HPV VACCINES  Aged Out    Health Maintenance  Health Maintenance Due  Topic Date  Due   DTaP/Tdap/Td (1 - Tdap) Never done   Zoster Vaccines- Shingrix (1 of 2) Never done    Pneumonia Vaccine 24+ Years old (1 of 1 - PCV) Never done   Diabetic kidney evaluation - Urine ACR  09/16/2020   OPHTHALMOLOGY EXAM  02/24/2021   FOOT EXAM  03/17/2021   INFLUENZA VACCINE  09/22/2021   COVID-19 Vaccine (5 - 2023-24 season) 10/23/2021   Medicare Annual Wellness (AWV)  03/26/2022   HEMOGLOBIN A1C  03/03/2022    {Colorectal cancer screening:2101809}  Lung Cancer Screening: (Low Dose CT Chest recommended if Age 51-80 years, 30 pack-year currently smoking OR have quit w/in 15years.) {DOES NOT does:27190::"does not"} qualify.   Lung Cancer Screening Referral: ***  Additional Screening:  Hepatitis C Screening: {DOES NOT does:27190::"does not"} qualify; Completed ***  Vision Screening: Recommended annual ophthalmology exams for early detection of glaucoma and other disorders of the eye. Is the patient up to date with their annual eye exam?  {YES/NO:21197} Who is the provider or what is the name of the office in which the patient attends annual eye exams? *** If pt is not established with a provider, would they like to be referred to a provider to establish care? {YES/NO:21197}.   Dental Screening: Recommended annual dental exams for proper oral hygiene  Community Resource Referral / Chronic Care Management: CRR required this visit?  {YES/NO:21197}  CCM required this visit?  {YES/NO:21197}     Plan:     I have personally reviewed and noted the following in the patient's chart:   Medical and social history Use of alcohol, tobacco or illicit drugs  Current medications and supplements including opioid prescriptions. {Opioid Prescriptions:[phone]} Functional ability and status Nutritional status Physical activity Advanced directives List of other physicians Hospitalizations, surgeries, and ER visits in previous 12 months Vitals Screenings to include cognitive, depression, and falls Referrals and appointments  In addition, I have reviewed and discussed with  patient certain preventive protocols, quality metrics, and best practice recommendations. A written personalized care plan for preventive services as well as general preventive health recommendations were provided to patient.     Denman George [place], [place]   05/24/7406   Nurse Notes: ***

## 2022-03-31 NOTE — Patient Instructions (Incomplete)
Ryan Mosley , Thank you for taking time to come for your Medicare Wellness Visit. I appreciate your ongoing commitment to your health goals. Please review the following plan we discussed and let me know if I can assist you in the future.   These are the goals we discussed:  Goals      Exercise 3x per week (30 min per time)     Continue to exercise and eat a healthy diet.      Pharmacy Care Plan:     CARE PLAN ENTRY (see longitudinal plan of care for additional care plan information)  Current Barriers:  Chronic Disease Management support, education, and care coordination needs related to Hypertension, Hyperlipidemia, Diabetes, and allergic rhinitis.   Hypertension BP Readings from Last 3 Encounters:  09/17/19 (!) 142/80  03/19/19 (!) 144/90  09/29/18 (!) 188/98  Pharmacist Clinical Goal(s): Over the next 180 days, patient will work with PharmD and providers to achieve BP goal <130/80 Current regimen:  Amlodipine '5mg'$  daily Lisinopril '40mg'$  daily Interventions: Reviewed home blood pressure monitoring Counseled on adherence Recommended home BP monitoring Patient self care activities - Over the next 180 days, patient will: Check BP periodically, document, and provide at future appointments Ensure daily salt intake < 2300 mg/day Contact providers if consistently > 140/90  Hyperlipidemia Lab Results  Component Value Date/Time   LDLCALC 140 (H) 09/17/2019 08:44 AM  Pharmacist Clinical Goal(s): Over the next 180 days, patient will work with PharmD and providers to achieve LDL goal < 100 Current regimen:  Rosuvastatin '5mg'$  twice weekly Interventions: Counseled on adherence and importance of statin medications Evaluated for adverse effects Recommend follow up lipid panel at 1/24 appt Patient self care activities - Over the next 180 days, patient will: Continue to focus on medication adherence by pill count Reports to PCP for follow up and updated lipid panel  Diabetes Lab  Results  Component Value Date/Time   HGBA1C 6.7 (H) 09/17/2019 08:44 AM   HGBA1C 6.6 (H) 03/19/2019 09:03 AM  Pharmacist Clinical Goal(s): Over the next 180 days, patient will work with PharmD and providers to maintain A1c goal <7% Current regimen:  No medications Interventions: Reviewed home blood sugar monitoring Evaluated for episodes of hypoglycemia Discussed diet as it related to blood sugar control Patient self care activities - Over the next 180 days, patient will: Check blood sugar once daily, document, and provide at future appointments Contact provider with any episodes of hypoglycemia (less than 70)  Allergic Rhinitis Pharmacist Clinical Goal(s) Over the next 180 days, patient will work with PharmD and providers to optimize medication related to allergic rhinitis Current regimen:  Currently not taking any medications for allergies Interventions: Discussed symptom frequency and type  Discussed other potential OTC options Patient self care activities - Over the next 180 days, patient will: Report to PCP for follow up to discuss alternative options Contact providers with any increase or change in symptoms  Please see past updates related to this goal by clicking on the "Past Updates" button in the selected goal          This is a list of the screening recommended for you and due dates:  Health Maintenance  Topic Date Due   DTaP/Tdap/Td vaccine (1 - Tdap) Never done   Zoster (Shingles) Vaccine (1 of 2) Never done   Pneumonia Vaccine (1 of 1 - PCV) Never done   Yearly kidney health urinalysis for diabetes  09/16/2020   Eye exam for diabetics  02/24/2021  Complete foot exam   03/17/2021   Flu Shot  09/22/2021   COVID-19 Vaccine (5 - 2023-24 season) 10/23/2021   Medicare Annual Wellness Visit  03/26/2022   Hemoglobin A1C  03/03/2022   Yearly kidney function blood test for diabetes  09/01/2022   HPV Vaccine  Aged Out    Advanced directives:   Conditions/risks  identified: Aim for 30 minutes of exercise or brisk walking, 6-8 glasses of water, and 5 servings of fruits and vegetables each day.   Next appointment: Follow up in one year for your annual wellness visit.   Preventive Care 81 Years and Older, Male  Preventive care refers to lifestyle choices and visits with your health care provider that can promote health and wellness. What does preventive care include? A yearly physical exam. This is also called an annual well check. Dental exams once or twice a year. Routine eye exams. Ask your health care provider how often you should have your eyes checked. Personal lifestyle choices, including: Daily care of your teeth and gums. Regular physical activity. Eating a healthy diet. Avoiding tobacco and drug use. Limiting alcohol use. Practicing safe sex. Taking low doses of aspirin every day. Taking vitamin and mineral supplements as recommended by your health care provider. What happens during an annual well check? The services and screenings done by your health care provider during your annual well check will depend on your age, overall health, lifestyle risk factors, and family history of disease. Counseling  Your health care provider may ask you questions about your: Alcohol use. Tobacco use. Drug use. Emotional well-being. Home and relationship well-being. Sexual activity. Eating habits. History of falls. Memory and ability to understand (cognition). Work and work Statistician. Screening  You may have the following tests or measurements: Height, weight, and BMI. Blood pressure. Lipid and cholesterol levels. These may be checked every 5 years, or more frequently if you are over 26 years old. Skin check. Lung cancer screening. You may have this screening every year starting at age 53 if you have a 30-pack-year history of smoking and currently smoke or have quit within the past 15 years. Fecal occult blood test (FOBT) of the stool. You  may have this test every year starting at age 6. Flexible sigmoidoscopy or colonoscopy. You may have a sigmoidoscopy every 5 years or a colonoscopy every 10 years starting at age 76. Prostate cancer screening. Recommendations will vary depending on your family history and other risks. Hepatitis C blood test. Hepatitis B blood test. Sexually transmitted disease (STD) testing. Diabetes screening. This is done by checking your blood sugar (glucose) after you have not eaten for a while (fasting). You may have this done every 1-3 years. Abdominal aortic aneurysm (AAA) screening. You may need this if you are a current or former smoker. Osteoporosis. You may be screened starting at age 19 if you are at high risk. Talk with your health care provider about your test results, treatment options, and if necessary, the need for more tests. Vaccines  Your health care provider may recommend certain vaccines, such as: Influenza vaccine. This is recommended every year. Tetanus, diphtheria, and acellular pertussis (Tdap, Td) vaccine. You may need a Td booster every 10 years. Zoster vaccine. You may need this after age 66. Pneumococcal 13-valent conjugate (PCV13) vaccine. One dose is recommended after age 17. Pneumococcal polysaccharide (PPSV23) vaccine. One dose is recommended after age 90. Talk to your health care provider about which screenings and vaccines you need and how often you  need them. This information is not intended to replace advice given to you by your health care provider. Make sure you discuss any questions you have with your health care provider. Document Released: 03/07/2015 Document Revised: 10/29/2015 Document Reviewed: 12/10/2014 Elsevier Interactive Patient Education  2017 [place] Prevention in the Home Falls can cause injuries. They can happen to people of all ages. There are many things you can do to make your home safe and to help prevent falls. What can I do on the  outside of my home? Regularly fix the edges of walkways and driveways and fix any cracks. Remove anything that might make you trip as you walk through a door, such as a raised step or threshold. Trim any bushes or trees on the path to your home. Use bright outdoor lighting. Clear any walking paths of anything that might make someone trip, such as rocks or tools. Regularly check to see if handrails are loose or broken. Make sure that both sides of any steps have handrails. Any raised decks and porches should have guardrails on the edges. Have any leaves, snow, or ice cleared regularly. Use sand or salt on walking paths during winter. Clean up any spills in your garage right away. This includes oil or grease spills. What can I do in the bathroom? Use night lights. Install grab bars by the toilet and in the tub and shower. Do not use towel bars as grab bars. Use non-skid mats or decals in the tub or shower. If you need to sit down in the shower, use a plastic, non-slip stool. Keep the floor dry. Clean up any water that spills on the floor as soon as it happens. Remove soap buildup in the tub or shower regularly. Attach bath mats securely with double-sided non-slip rug tape. Do not have throw rugs and other things on the floor that can make you trip. What can I do in the bedroom? Use night lights. Make sure that you have a light by your bed that is easy to reach. Do not use any sheets or blankets that are too big for your bed. They should not hang down onto the floor. Have a firm chair that has side arms. You can use this for support while you get dressed. Do not have throw rugs and other things on the floor that can make you trip. What can I do in the kitchen? Clean up any spills right away. Avoid walking on wet floors. Keep items that you use a lot in easy-to-reach places. If you need to reach something above you, use a strong step stool that has a grab bar. Keep electrical cords out of  the way. Do not use floor polish or wax that makes floors slippery. If you must use wax, use non-skid floor wax. Do not have throw rugs and other things on the floor that can make you trip. What can I do with my stairs? Do not leave any items on the stairs. Make sure that there are handrails on both sides of the stairs and use them. Fix handrails that are broken or loose. Make sure that handrails are as long as the stairways. Check any carpeting to make sure that it is firmly attached to the stairs. Fix any carpet that is loose or worn. Avoid having throw rugs at the top or bottom of the stairs. If you do have throw rugs, attach them to the floor with carpet tape. Make sure that you have a light switch  at the top of the stairs and the bottom of the stairs. If you do not have them, ask someone to add them for you. What else can I do to help prevent falls? Wear shoes that: Do not have high heels. Have rubber bottoms. Are comfortable and fit you well. Are closed at the toe. Do not wear sandals. If you use a stepladder: Make sure that it is fully opened. Do not climb a closed stepladder. Make sure that both sides of the stepladder are locked into place. Ask someone to hold it for you, if possible. Clearly mark and make sure that you can see: Any grab bars or handrails. First and last steps. Where the edge of each step is. Use tools that help you move around (mobility aids) if they are needed. These include: Canes. Walkers. Scooters. Crutches. Turn on the lights when you go into a dark area. Replace any light bulbs as soon as they burn out. Set up your furniture so you have a clear path. Avoid moving your furniture around. If any of your floors are uneven, fix them. If there are any pets around you, be aware of where they are. Review your medicines with your doctor. Some medicines can make you feel dizzy. This can increase your chance of falling. Ask your doctor what other things that you  can do to help prevent falls. This information is not intended to replace advice given to you by your health care provider. Make sure you discuss any questions you have with your health care provider. Document Released: 12/05/2008 Document Revised: 07/17/2015 Document Reviewed: 03/15/2014 Elsevier Interactive Patient Education  2017 [employer].

## 2022-04-06 ENCOUNTER — Telehealth: Payer: Self-pay | Admitting: Family Medicine

## 2022-04-06 NOTE — Telephone Encounter (Signed)
Left message for patient to call back and schedule Medicare Annual Wellness Visit (AWV) in office.   If not able to come in office, please offer to do virtually or by telephone.   Last AWV:03/26/2021   Please schedule at any time with [facility]-Nurse Health Advisor.  30 minute appointment  Any questions, please contact me at [phone]   Thank you,   [facility]  Ambulatory Clinical Support for Ryan Mosley. [facility] ??[id number] or ??[id number]

## 2022-05-17 ENCOUNTER — Other Ambulatory Visit: Payer: Self-pay

## 2022-05-17 ENCOUNTER — Telehealth: Payer: Self-pay

## 2022-05-17 DIAGNOSIS — E782 Mixed hyperlipidemia: Secondary | ICD-10-CM

## 2022-05-17 MED ORDER — ROSUVASTATIN CALCIUM 5 MG PO TABS: ORAL_TABLET | ORAL | 0 refills | Status: DC

## 2022-05-17 NOTE — Telephone Encounter (Signed)
Prescription Request  05/17/2022  LOV: 04/01/22  What is the name of the medication or equipment? rosuvastatin (CRESTOR) 5 MG tablet [id number]  Have you contacted your pharmacy to request a refill? Yes   Which pharmacy would you like this sent to?  [id number] [address], [place] [address] [place] [address] Phone: [phone] Fax: [phone]    Patient notified that their request is being sent to the clinical staff for review and that they should receive a response within 2 business days.   Please advise at [place]

## 2022-05-25 ENCOUNTER — Telehealth: Payer: Self-pay | Admitting: Family Medicine

## 2022-05-25 NOTE — Telephone Encounter (Signed)
Called patient to schedule Medicare Annual Wellness Visit (AWV). Left message for patient to call back and schedule Medicare Annual Wellness Visit (AWV).  Last date of AWV: 03/26/2021   Please schedule an appointment at any time with Loma Sousa, [facility] .  If any questions, please contact me at [phone].  Thank you,  Colletta Maryland,  [place] [facility] Direct Dial ??[phone]

## 2022-06-03 ENCOUNTER — Encounter: Payer: Self-pay | Admitting: Family Medicine

## 2022-06-03 ENCOUNTER — Ambulatory Visit (INDEPENDENT_AMBULATORY_CARE_PROVIDER_SITE_OTHER): Payer: Medicare Other | Admitting: Family Medicine

## 2022-06-03 VITALS — BP 124/76 | HR 73 | Temp 98.6°F | Ht 69.0 in | Wt 162.4 lb

## 2022-06-03 DIAGNOSIS — R911 Solitary pulmonary nodule: Secondary | ICD-10-CM | POA: Diagnosis not present

## 2022-06-03 DIAGNOSIS — C20 Malignant neoplasm of rectum: Secondary | ICD-10-CM | POA: Diagnosis not present

## 2022-06-03 DIAGNOSIS — I10 Essential (primary) hypertension: Secondary | ICD-10-CM | POA: Diagnosis not present

## 2022-06-03 NOTE — Progress Notes (Signed)
Subjective:    Patient ID: Ryan Mosley, male    DOB: 04/05/1940, 82 y.o.   MRN: [id number]  HPI 9/22 Patient was admitted to the hospital in May with a GI bleed.  Colonoscopy revealed rectal cancer.  He recently underwent resection for the rectal cancer.  Staging was T3N0.  Margins were clear on the biopsy and lymph nodes were negative.  However during his hospitalization a CT scan of the chest showed a mass in the left lung.  Results are included below: IMPRESSION: 1. 1.4 by 1.1 cm sub solid nodule in the superior segment left lower lobe. This is not typical for metastatic disease but could be inflammatory or conceivably low-grade adenocarcinoma. There is also a 3 mm left lower lobe nodule. Initial follow-up with CT at 6-12 months is recommended to confirm persistence. If persistent, repeat CT is recommended every 2 years until 5 years of stability has been established. This recommendation follows the consensus statement: Guidelines for Management of Incidental Pulmonary Nodules Detected on CT Images: From the Fleischner Society 2017; Radiology 2017; 284:228-243. 2. Low-density blood pool compatible with anemia. 3.  Aortic Atherosclerosis (ICD10-I70.0). 4.  Emphysema (ICD10-J43.9). 5. Thoracic spondylosis.  Patient is here today to discuss this.  I recommended that we perform either a PET scan or a CT scan or consult pulmonology regarding the mass in his lung.  The patient states that he is almost 82 years old and he does not want to undergo any more work-up.  He wants to wait for now.  I explained to him that if we wait and this is cancer, it could likely grow and spread and then be incurable when he does want to pursue it further.  The patient states that he understands this however he has no desire for any additional testing.  03/02/21 Patient is here today for follow-up.  He has a history of invasive adenocarcinoma of the rectum.  This is being followed by his general surgeon and his  gastroenterologist.  He is here today to follow-up his hypertension.  His blood pressure is well controlled.  Also discussed with him the CAT scan that he had in May.  I explained to him that the nodule in his lung may be cancerous.  They recommended repeating a CAT scan in 6 to 12 months to monitor this.  The patient declines further testing.  He states that he wants to wait until he has symptoms.  I explained to him that it is cancer, and we wait until symptoms arise, it would likely be too late to do any treatment.  Patient seems confused.  He was not aware that he had cancer in his rectum.  I again discussed this with him and also recommended repeating a CAT scan of his lung to monitor the nodule.  He defers this at the present time.  At that time, my plan was:  As I did at his last appointment I recommended repeating at least a CAT scan to monitor the lesion in his lung.  He refuses.  I explained to him that waiting until he has symptoms would likely be too late to treat any cancer that he had.  He still declines at the present time.  His blood pressure today is acceptable.  I will check a CBC to monitor for any anemia given his history of GI bleeding, CMP and a lipid panel.  His goal LDL cholesterol is less than 100.  I explained to the patient that I would be  happy to repeat a CAT scan at any time of his lungs and that this would be my recommendation.  He politely defers at the present time.  08/31/21  I have copied part of the office note from his general surgeon in March: "Before I could explain to him my recommendations he stood up and said I had never told him about the biopsy results. I tried to clarify as I noted the path on this will come back in a week. Best I can gather he was stating I never told him if he had cancer or not. He recalls his daughter getting a message saying it was negative. I noted I had told him multiple times & printed out reports saying he had cancer. He started arguing &  eventually shouting at me. Unfortunately I started talking more sternly and eventually shouting back some in an effort to match his intensity and try and calm the conversation back down. I noted that I had given him copies of pathology reports from my surgery 6 months ago and explained to him multiple times and again restated he definitely has rectal cancer. He then stated the first time I saw him that I apparently stood in the doorway for 30 seconds and he could tell that I thought he was garbage. I do not recall standing in the doorway and I noted that I try and take care I have everyone equally well & to the best of my ability. He then stated that someone called his daughter and said the biopsy was negative and what was out about. I asked for clarification. He remembers saying that rectal cancer cannot be cured and remembers me arguing with him. I noted I tried to explain to him that he definitely had options and I worried that the cancer was going to come back without further treatment. I tried to plead to his wife and tried to explain to him that I had given my advice and he was going against that. I worry about local recurrence and the pain and problems that that can cause. I noted that It did not make sense to me that he states that he does not trust me and thinks I do not care for him; yet, he keeps coming to appointments. He stated he knew I did not care about him and he left. I updated the office CEO, Mr Janalyn Harder, of the event."  Patient is here today with his wife.  His blood pressure is extremely high.  I went over his medication list with him and I am very confused.  He states that he is only taking 1 medicine that is 5 mg.  He denies taking lisinopril.  However at other times he mentions taking a cholesterol pill.  However, it sounds like he is stopping lisinopril at some point.  He denies any chest pain shortness of breath or dyspnea on exertion.  He denies any hemoptysis or fevers or chills.  I went  over the results of his biopsy from March.  I explained that it did show recurrence of his cancer in his rectal area.  I also reported over the surgeon's recommendations about a wider excision versus referral to oncology to discuss chemotherapy or local radiation treatment.  Patient refuses all of this at this time.  I asked him to consider it more in depth and also discussed the situation was with his wife who was in the room with him however at the present time he declines any further treatment.  He states that none of these treatments work.  I also discussed the spot in his lung.  Again I recommended a CT scan.  However the patient refuses this as well.  At that time, my plan was: I discussed the recurrence of his rectal cancer.  I offered the patient a referral to oncology which he declined.  Offered the patient a referral to general surgery which he declined.  I discussed this at length with both he and his wife but at the present time, the patient refuses any further treatment or referrals.  Also recommended a CT scan of the lung however the patient refuses this as well.  I explained that if the cancer spreading, it could reach a point where he could die from the cancer however the patient refuses any treatment at this time.  I will add lisinopril 40 mg a day back to amlodipine.  I asked the patient to recheck in 2 weeks his blood pressure here at an office visit  06/03/22  Patient is here today to follow-up his blood pressure.  He continues to decline a CT scan to monitor the lesion seen in his lung.  He has not followed up with general surgery or GI or oncology regarding his rectal cancer.  I did perform a rectal exam today.  He denies any pain with defecation.  He denies any blood in his stool.  I do not appreciate any mass today on digital rectal exam.  There does appear to be a firm area at roughly 7:00 which I believe may be residual scar tissue however there is no palpable nodule.  He denies any  chest pain or shortness of breath or dyspnea on exertion Past Medical History:  Diagnosis Date   Acute blood loss anemia    on meds   Blood transfusion without reported diagnosis 2022   Cataract    bilateral sx   Controlled diabetes mellitus type 2 with complications    Diabetes mellitus without complication    not on meds at this time   GERD (gastroesophageal reflux disease)    hx of   GI bleed    Hyperlipidemia    on meds   Hypertension    on meds   Meningitis 1960/1970   Pneumonia 1960/1970   Rectal polyp    Past Surgical History:  Procedure Laterality Date   BIOPSY  07/08/2020   Procedure: BIOPSY;  Surgeon: Tressia Danas, MD;  Location: [facility] ENDOSCOPY;  Service: Gastroenterology;;   COLONOSCOPY WITH PROPOFOL N/A 07/08/2020   Procedure: COLONOSCOPY WITH PROPOFOL;  Surgeon: Tressia Danas, MD;  Location: [facility] ENDOSCOPY;  Service: Gastroenterology;  Laterality: N/A;   DENTAL SURGERY     all teeth removed   XI ROBOT ASSISTED TRANSANAL RESECTION N/A 09/25/2020   Procedure: TAMIS PARTIAL PROCTECTOMY OF RECTAL MASS;  Surgeon: Karie Soda, MD;  Location: [facility] ORS;  Service: General;  Laterality: N/A;   Current Outpatient Medications on File Prior to Visit  Medication Sig Dispense Refill   acetaminophen (TYLENOL) 325 MG tablet Take 650 mg by mouth daily.     amLODipine (NORVASC) 5 MG tablet TAKE 1 TABLET BY MOUTH EVERY DAY FOR BLOOD PRESSURE 90 tablet 2   latanoprost (XALATAN) 0.005 % ophthalmic solution Place 1 drop into both eyes at bedtime.  3   lisinopril (ZESTRIL) 40 MG tablet Take 1 tablet (40 mg total) by mouth daily. 90 tablet 3   rosuvastatin (CRESTOR) 5 MG tablet TAKE 1 TABLET BY MOUTH ONCE A WEEK FOR  CHOLESTEROL 8 tablet 0   No current facility-administered medications on file prior to visit.   Allergies  Allergen Reactions   Metformin Nausea And Vomiting   Social History   Socioeconomic History   Marital status: Married    Spouse name: Corene   Number of  children: Not on file   Years of education: Not on file   Highest education level: Not on file  Occupational History   Not on file  Tobacco Use   Smoking status: Former   Smokeless tobacco: Current    Types: Chew  Vaping Use   Vaping Use: Never used  Substance and Sexual Activity   Alcohol use: Not Currently    Alcohol/week: 1.0 standard drink of alcohol    Types: 1 Standard drinks or equivalent per week   Drug use: Never   Sexual activity: Yes  Other Topics Concern   Not on file  Social History Narrative   Not on file   Social Determinants of Health   Financial Resource Strain: Low Risk  (03/26/2021)   Overall Financial Resource Strain (CARDIA)    Difficulty of Paying Living Expenses: Not hard at all  Food Insecurity: No Food Insecurity (03/26/2021)   Hunger Vital Sign    Worried About Running Out of Food in the Last Year: Never true    Ran Out of Food in the Last Year: Never true  Transportation Needs: No Transportation Needs (03/26/2021)   PRAPARE - Administrator, Civil Service (Medical): No    Lack of Transportation (Non-Medical): No  Physical Activity: Sufficiently Active (03/26/2021)   Exercise Vital Sign    Days of Exercise per Week: 5 days    Minutes of Exercise per Session: 30 min  Stress: No Stress Concern Present (03/26/2021)   [employer] of Occupational Health - Occupational Stress Questionnaire    Feeling of Stress : Not at all  Social Connections: Moderately Isolated (03/26/2021)   Social Connection and Isolation Panel [NHANES]    Frequency of Communication with Friends and Family: More than three times a week    Frequency of Social Gatherings with Friends and Family: More than three times a week    Attends Religious Services: Never    Database administrator or Organizations: No    Attends Banker Meetings: Never    Marital Status: Married  Catering manager Violence: Not At Risk (03/26/2021)   Humiliation, Afraid, Rape, and Kick  questionnaire    Fear of Current or Ex-Partner: No    Emotionally Abused: No    Physically Abused: No    Sexually Abused: No     Review of Systems  All other systems reviewed and are negative.      Objective:   Physical Exam Vitals reviewed.  Constitutional:      Appearance: Normal appearance. He is normal weight.  Cardiovascular:     Rate and Rhythm: Normal rate and regular rhythm.     Heart sounds: Normal heart sounds. No murmur heard.    No gallop.  Pulmonary:     Effort: Pulmonary effort is normal. No respiratory distress.     Breath sounds: Normal breath sounds. No wheezing or rhonchi.  Abdominal:     General: Bowel sounds are normal. There is no distension.     Palpations: Abdomen is soft.     Tenderness: There is no abdominal tenderness. There is no guarding.  Genitourinary:    Rectum: Normal.  Musculoskeletal:     Right lower  leg: No edema.     Left lower leg: No edema.  Neurological:     Mental Status: He is alert.           Assessment & Plan:  Rectal cancer - Plan: CEA  Benign essential HTN - Plan: CBC with Differential/Platelet, Lipid panel, COMPLETE METABOLIC PANEL WITH GFR  Pulmonary nodule 1 cm or greater in diameter I am very happy with his blood pressure.  He stopped hydrochlorothiazide because he states that it caused his nose to bleed and his eyes to go blurry.  However amlodipine and lisinopril seem to be managing his blood pressure well.  I will check a CBC a CMP and lipid panel.  Patient refuses a CT scan to look for lung cancer.  If he changes mind I will be glad to order that.  I did perform a rectal exam today and I did not appreciate any mass.  I will check a CEA level.  If rising, would refer the patient to GI for colonoscopy

## 2022-06-04 ENCOUNTER — Other Ambulatory Visit: Payer: Self-pay

## 2022-06-04 DIAGNOSIS — C20 Malignant neoplasm of rectum: Secondary | ICD-10-CM

## 2022-06-04 DIAGNOSIS — R970 Elevated carcinoembryonic antigen [CEA]: Secondary | ICD-10-CM

## 2022-06-04 LAB — COMPLETE METABOLIC PANEL WITH GFR
AG Ratio: 1.5 (calc) (ref 1.0–2.5)
ALT: 19 U/L (ref 9–46)
AST: 19 U/L (ref 10–35)
Albumin: 4.8 g/dL (ref 3.6–5.1)
Alkaline phosphatase (APISO): 86 U/L (ref 35–144)
BUN: 8 mg/dL (ref 7–25)
CO2: 26 mmol/L (ref 20–32)
Calcium: 9.6 mg/dL (ref 8.6–10.3)
Chloride: 102 mmol/L (ref 98–110)
Creat: 0.77 mg/dL (ref 0.70–1.22)
Globulin: 3.1 g/dL (calc) (ref 1.9–3.7)
Glucose, Bld: 126 mg/dL — ABNORMAL HIGH (ref 65–99)
Potassium: 4 mmol/L (ref 3.5–5.3)
Sodium: 141 mmol/L (ref 135–146)
Total Bilirubin: 0.6 mg/dL (ref 0.2–1.2)
Total Protein: 7.9 g/dL (ref 6.1–8.1)
eGFR: 90 mL/min/{1.73_m2} (ref >=60)

## 2022-06-04 LAB — CBC WITH DIFFERENTIAL/PLATELET
Absolute Monocytes: 623 cells/uL (ref 200–950)
Basophils Absolute: 38 cells/uL (ref 0–200)
Basophils Relative: 0.5 %
Eosinophils Absolute: 122 cells/uL (ref 15–500)
Eosinophils Relative: 1.6 %
HCT: 47.7 % (ref 38.5–50.0)
Hemoglobin: 15.5 g/dL (ref 13.2–17.1)
Lymphs Abs: 2143 cells/uL (ref 850–3900)
MCH: 29.9 pg (ref 27.0–33.0)
MCHC: 32.5 g/dL (ref 32.0–36.0)
MCV: 91.9 fL (ref 80.0–100.0)
MPV: 10.1 fL (ref 7.5–12.5)
Monocytes Relative: 8.2 %
Neutro Abs: 4674 cells/uL (ref 1500–7800)
Neutrophils Relative %: 61.5 %
Platelets: 254 10*3/uL (ref 140–400)
RBC: 5.19 10*6/uL (ref 4.20–5.80)
RDW: 12.4 % (ref 11.0–15.0)
Total Lymphocyte: 28.2 %
WBC: 7.6 10*3/uL (ref 3.8–10.8)

## 2022-06-04 LAB — LIPID PANEL
Cholesterol: 213 mg/dL — ABNORMAL HIGH (ref <200)
HDL: 45 mg/dL (ref >=40)
LDL Cholesterol (Calc): 149 mg/dL (calc) — ABNORMAL HIGH
Non-HDL Cholesterol (Calc): 168 mg/dL (calc) — ABNORMAL HIGH (ref <130)
Total CHOL/HDL Ratio: 4.7 (calc) (ref <5.0)
Triglycerides: 82 mg/dL (ref <150)

## 2022-06-04 LAB — CEA: CEA: 29.5 ng/mL — ABNORMAL HIGH

## 2022-06-22 DIAGNOSIS — H401133 Primary open-angle glaucoma, bilateral, severe stage: Secondary | ICD-10-CM | POA: Diagnosis not present

## 2022-06-27 ENCOUNTER — Other Ambulatory Visit: Payer: Self-pay | Admitting: Family Medicine

## 2022-07-12 ENCOUNTER — Telehealth: Payer: Self-pay

## 2022-07-12 NOTE — Telephone Encounter (Signed)
Pt's wife called wanting to speak with nurse about pt's referral. Pt's wife said they waited to hear about appt for pt. Pt's wife stated that the provider that was chosen for pt has retired. Pt's wife would like to know if pt could get referral sent to another dr. Please advise  Cb#: [phone]

## 2022-07-20 ENCOUNTER — Encounter: Payer: Self-pay | Admitting: Gastroenterology

## 2022-07-20 ENCOUNTER — Ambulatory Visit: Payer: Medicare Other | Admitting: Gastroenterology

## 2022-07-20 VITALS — BP 150/80 | HR 88 | Ht 68.0 in | Wt 157.0 lb

## 2022-07-20 DIAGNOSIS — Z85048 Personal history of other malignant neoplasm of rectum, rectosigmoid junction, and anus: Secondary | ICD-10-CM

## 2022-07-20 DIAGNOSIS — R194 Change in bowel habit: Secondary | ICD-10-CM | POA: Diagnosis not present

## 2022-07-20 DIAGNOSIS — R970 Elevated carcinoembryonic antigen [CEA]: Secondary | ICD-10-CM | POA: Diagnosis not present

## 2022-07-20 NOTE — Patient Instructions (Signed)
_______________________________________________________  If your blood pressure at your visit was 140/90 or greater, please contact your primary care physician to follow up on this.  _______________________________________________________  If you are age 81 or older, your body mass index should be between 23-30. Your Body mass index is 23.87 kg/m. If this is out of the aforementioned range listed, please consider follow up with your Primary Care Provider.  If you are age 57 or younger, your body mass index should be between 19-25. Your Body mass index is 23.87 kg/m. If this is out of the aformentioned range listed, please consider follow up with your Primary Care Provider.   ________________________________________________________  The [facility] providers would like to encourage you to use [facility] to communicate with providers for non-urgent requests or questions.  Due to long hold times on the telephone, sending your provider a message by [facility] may be a faster and more efficient way to get a response.  Please allow 48 business hours for a response.  Please remember that this is for non-urgent requests.  _______________________________________________________  Please call if you decide to with treatment options   It was a pleasure to see you today!  Thank you for trusting me with your gastrointestinal care!

## 2022-07-20 NOTE — Progress Notes (Signed)
[facility] Progress Note:  History: Ryan Mosley 07/20/2022  Referring provider: Donita Brooks, MD  Reason for consult/chief complaint: Rectal Cancer (Pt states pcp referred him .)   Subjective  HPI: Summary of GI issues: From Dr. Tressia Danas office note July 2022;  " Ryan Mosley is a 82 y.o. male who returns in follow-up. I met him during his hospitalization in May for GI bleeding. Interval history is obtained through the patient, his wife who accompanies him to this appointment, and review of his electronic health record. He was found to have a rectal mass. 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 MRI not performed because he received IV iron while hospitalized. CTA abd/pelvis and chest CT showed a 1.4 cm LLL lung nodule and emphysema. EUS performing physicians did not feel the findings would change management. Surgery offered transanal resection through robotic TAMIS or TEM. He initially declined by has since reconsidered. He has surgery with Dr. Michaell Cowing the first week of August.   He denies any ongoing bleeding. He does not tolerate oral iron supplements due to headaches. Some insomnia. No new GI complaints. "  From Dr. 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  Consult attempts were made through the [facility]. Patient had declined any further intervention. 21-month MRI was ordered. He did not want to do that. He has showed up for 3 and 6 months endoscopic follow-up though with his wife.  Patient notes he is moving his bowels once or twice a day. Not on any particular fiber supplement. No incontinence of pain or bleeding."  Follow up sigmoidoscopy (Gross) showed recurrence of  polyp/mass and pathology showed TVA with HGD.  Patient reportedly declined further evaluation, and there also indication that patient upset and did not feel fully informed about the condition.  Saw oncology Sept 2022 and offered neoadjuvant therapy, which he declined after further consideration.  Saw PCP (Pickard) 06/03/22 and rectal exam with nodule.  Patient has also reportedly declined further chest or abdominal imaging. _________________________________   Ryan Mosley was here with his wife today.  After performing a thorough chart review of the above we discussed his reason for visit today.  He says he is bothered by some irritation of the skin between his buttocks when he walks and it is better when he sits down.  That is really all he wanted evaluated by me today.  On further questioning, it sounds like he has episodic constipation and sometimes has leakage of stool or gas.  He denies rectal bleeding.  Denies abdominal pain, says his appetite is good, and has not really been keeping track of his weight lately.  (See below-he has been losing weight lately)   ROS:  Review of Systems He denies chest pain, cough, hemoptysis, dysuria or hematuria. Arthralgias Remainder systems negative except as above  Past Medical History: Past Medical History:  Diagnosis Date   Acute blood loss anemia    on meds   Blood transfusion without reported diagnosis 2022   Cataract    bilateral sx   Controlled diabetes mellitus type 2 with complications (HCC)    Diabetes mellitus without complication (HCC)    not on meds at this time   GERD (gastroesophageal reflux disease)    hx of   GI bleed  Hyperlipidemia    on meds   Hypertension    on meds   Meningitis 1960/1970   Pneumonia 1960/1970   Rectal polyp      Past Surgical History: Past Surgical History:  Procedure Laterality Date   BIOPSY  07/08/2020   Procedure: BIOPSY;  Surgeon: Tressia Danas, MD;  Location: [facility] ENDOSCOPY;  Service:  Gastroenterology;;   COLONOSCOPY WITH PROPOFOL N/A 07/08/2020   Procedure: COLONOSCOPY WITH PROPOFOL;  Surgeon: Tressia Danas, MD;  Location: [facility] ENDOSCOPY;  Service: Gastroenterology;  Laterality: N/A;   DENTAL SURGERY     all teeth removed   XI ROBOT ASSISTED TRANSANAL RESECTION N/A 09/25/2020   Procedure: TAMIS PARTIAL PROCTECTOMY OF RECTAL MASS;  Surgeon: Karie Soda, MD;  Location: [facility] ORS;  Service: General;  Laterality: N/A;     Family History: Family History  Problem Relation Age of Onset   Seizures Daughter    Diabetes Son    Colon polyps Neg Hx    Colon cancer Neg Hx    Esophageal cancer Neg Hx    Rectal cancer Neg Hx    Stomach cancer Neg Hx     Social History: Social History   Socioeconomic History   Marital status: Married    Spouse name: Corene   Number of children: Not on file   Years of education: Not on file   Highest education level: Not on file  Occupational History   Not on file  Tobacco Use   Smoking status: Former   Smokeless tobacco: Current    Types: Associate Professor Use: Never used  Substance and Sexual Activity   Alcohol use: Not Currently    Alcohol/week: 1.0 standard drink of alcohol    Types: 1 Standard drinks or equivalent per week   Drug use: Never   Sexual activity: Yes  Other Topics Concern   Not on file  Social History Narrative   Not on file   Social Determinants of Health   Financial Resource Strain: Low Risk  (03/26/2021)   Overall Financial Resource Strain (CARDIA)    Difficulty of Paying Living Expenses: Not hard at all  Food Insecurity: No Food Insecurity (03/26/2021)   Hunger Vital Sign    Worried About Running Out of Food in the Last Year: Never true    Ran Out of Food in the Last Year: Never true  Transportation Needs: No Transportation Needs (03/26/2021)   PRAPARE - Administrator, Civil Service (Medical): No    Lack of Transportation (Non-Medical): No  Physical Activity: Sufficiently Active  (03/26/2021)   Exercise Vital Sign    Days of Exercise per Week: 5 days    Minutes of Exercise per Session: 30 min  Stress: No Stress Concern Present (03/26/2021)   [employer] of Occupational Health - Occupational Stress Questionnaire    Feeling of Stress : Not at all  Social Connections: Moderately Isolated (03/26/2021)   Social Connection and Isolation Panel [NHANES]    Frequency of Communication with Friends and Family: More than three times a week    Frequency of Social Gatherings with Friends and Family: More than three times a week    Attends Religious Services: Never    Database administrator or Organizations: No    Attends Banker Meetings: Never    Marital Status: Married    Allergies: Allergies  Allergen Reactions   Metformin Nausea And Vomiting    Outpatient Meds: Current Outpatient Medications  Medication Sig  Dispense Refill   acetaminophen (TYLENOL) 325 MG tablet Take 650 mg by mouth daily.     amLODipine (NORVASC) 5 MG tablet TAKE 1 TABLET BY MOUTH EVERY DAY FOR BLOOD PRESSURE 90 tablet 2   latanoprost (XALATAN) 0.005 % ophthalmic solution Place 1 drop into both eyes at bedtime.  3   lisinopril (ZESTRIL) 40 MG tablet Take 1 tablet (40 mg total) by mouth daily. 90 tablet 3   rosuvastatin (CRESTOR) 5 MG tablet TAKE 1 TABLET BY MOUTH ONCE A WEEK FOR CHOLESTEROL 8 tablet 0   No current facility-administered medications for this visit.      ___________________________________________________________________ Objective   Exam:  BP (!) 150/80   Pulse 88   Ht 5\' 8"  (1.727 m)   Wt 157 lb (71.2 kg)   BMI 23.87 kg/m  Wt Readings from Last 3 Encounters:  07/20/22 157 lb (71.2 kg)  06/03/22 162 lb 6.4 oz (73.7 kg)  10/12/21 168 lb (76.2 kg)  Note weight loss documented  General: Thin.  Ambulatory, gets on exam table and changes without difficulty. Eyes: sclera anicteric, no redness ENT: oral mucosa moist without lesions, no cervical or  supraclavicular lymphadenopathy CV: Regular without appreciable murmur, no JVD, no peripheral edema Resp: clear to auscultation bilaterally, normal RR and effort noted GI: soft, no tenderness, with active bowel sounds. No guarding or palpable organomegaly noted. Perianal skin mildly irritated from moisture. DRE reveals palpable firm distal rectal mass involving most of the posterior lateral circumference of the rectum  Labs:     Latest Ref Rng & Units 06/03/2022    9:05 AM 08/31/2021    8:33 AM 03/02/2021    8:49 AM  CBC  WBC 3.8 - 10.8 Thousand/uL 7.6  5.9  6.9   Hemoglobin 13.2 - 17.1 g/dL 16.1  09.6  04.5   Hematocrit 38.5 - 50.0 % 47.7  48.0  47.7   Platelets 140 - 400 Thousand/uL 254  224  252       Latest Ref Rng & Units 06/03/2022    9:05 AM 08/31/2021    8:33 AM 03/02/2021    8:49 AM  CMP  Glucose 65 - 99 mg/dL 409  811  914   BUN 7 - 25 mg/dL 8  13  14    Creatinine 0.70 - 1.22 mg/dL 7.82  9.56  2.13   Sodium 135 - 146 mmol/L 141  139  139   Potassium 3.5 - 5.3 mmol/L 4.0  4.3  4.4   Chloride 98 - 110 mmol/L 102  105  104   CO2 20 - 32 mmol/L 26  26  24    Calcium 8.6 - 10.3 mg/dL 9.6  9.2  9.4   Total Protein 6.1 - 8.1 g/dL 7.9  7.5  7.6   Total Bilirubin 0.2 - 1.2 mg/dL 0.6  0.4  0.5   AST 10 - 35 U/L 19  15  20    ALT 9 - 46 U/L 19  17  19       Radiologic Studies:  Last CT chest and abdomen May 2022  Recent CEA elevated at 29.5   (was normal at 1.4 in May 2022)  Surgical pathology report from August 2022:   SURGICAL PATHOLOGY * THIS IS AN ADDENDUM REPORT  CASE: [id number] PATIENT: Ryan Mosley Surgical Pathology Report   Reason for Addendum #1:  DNA Mismatch Repair IHC Results  Clinical History: Rectal polyp; high grade dysplasia (crm)     FINAL MICROSCOPIC DIAGNOSIS:  A. RECTAL  POLYP, DISTAL, SUPERFICIAL COMPONENTS, EXCISION: - Invasive adenocarcinoma arising in a tubulovillous adenoma with high grade dysplasia. - Tumor involves deep edge of  fragments.  B. 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 - Tumor invades through muscularis propria into pericolorectal tissue. - Margins are negative, see comment. - Two of two lymph nodes negative for carcinoma (0/2). - See oncology table.  C. ANTERIOR DEEP MARGIN: - Benign fibroadipose tissue.  ONCOLOGY TABLE:  COLON AND RECTUM, CARCINOMA:  Resection, Including Transanal Disk Excision of Rectal Neoplasms  Procedure: Transanal partial proctectomy Tumor Site: Rectum Tumor Size: At least 3 cm Macroscopic Tumor Perforation: Not identified Macroscopic Evaluation of Mesorectum (required for rectal cancer): Not applicable Histologic Type: Invasive adenocarcinoma Histologic Grade: G2, moderately differentiated Multiple Primary Sites: Not applicable Tumor Extension: Tumor invades through muscularis propria into pericolorectal tissue Lymphovascular Invasion: Not identified Perineural Invasion: Not identified Treatment Effect: No known presurgical therapy Margins:      Margin Status for Invasive Carcinoma: All margins negative for invasive carcinoma, see comment.      Distance from Invasive Carcinoma to Closest Mucosal Margin (relevant and required only for           transanal disc excisions): 4 mm to anterior medial margin, 1 mm to original deep margin      Margin Status for Non-Invasive Tumor: All margins negative for high-grade dysplasia / intramucosal           carcinoma and low-grade dysplasia Regional Lymph Nodes: [Not applicable (no lymph nodes submitted or found)]      Number of Lymph Nodes with Tumor: 0      Number of Lymph Nodes Examined: 2 Tumor Deposits: Not identified Distant Metastasis:      Distant Site(s) Involved: Not applicable Pathologic Stage Classification (pTNM, AJCC 8th Edition): pT3, pN0 Ancillary Studies: MMR / MSI testing will be ordered. Representative Tumor Block:  B22 Comments: The deep margin is difficult to evaluate in areas of disrupted tumor, however, intact areas have negative margins and tumor is not seen on ink. Additional deep margin is negative.    Assessment: Encounter Diagnoses  Name Primary?   History of rectal cancer Yes   Altered bowel habits    Elevated CEA     Ryan Mosley has altered bowel habits with seepage of stool contents causing skin irritation which was really the main thing bothering him that he wanted evaluated today.  While he can certainly put some zinc oxide barrier ointment (or Vaseline as his wife suggested), the underlying problem is that he has recurrence of rectal cancer that seems likely to be at least locally advanced if not metastatic at this point.  I expressed to him my great concern about this, and I recommended that he let us schedule a CT scan of the chest abdomen and pelvis for further evaluation.  That should then be followed by a return to the oncology clinic to discuss treatment options.  However, he was adamant that he would not want to have any cancer treatments if they were offered to him unless "it is just a pill".  I did my best to explain to him that treatment choices for colorectal cancer are complex and best discussed with the oncology clinic.  However, regardless of what chemotherapy might look like, he would be offered radiation as well, something that he does not currently want. This was a challenging discussion, and it is not clear the extent to which he fully understands  this scenario.  I therefore offered him the option to have a child or other family member who might help manage his medical care and review my note and contact us with further questions.  He was also given the offer to contact us directly if he changes his mind about pursuing the imaging tests and a referral to oncology.  I made it clear in no uncertain terms that this is a serious problem and symptoms will only continue to worsen  without treatment.   35 minutes were spent on this encounter (including chart review, history/exam, counseling/coordination of care, and documentation) > 50% of that time was spent on counseling and coordination of care.   Ryan Mosley  CC: Referring provider noted above

## 2022-08-15 IMAGING — CT CT CTA ABD/PEL W/CM AND/OR W/O CM
3 of 10 series · 11 of 46 positions shown, 17 images · IV contrast (Omni 300)
Comparison: None.

CLINICAL DATA: 80-year-old male with rectal bleeding.

EXAM:
CTA ABDOMEN AND PELVIS WITH CONTRAST
TECHNIQUE: Multidetector CT imaging of the abdomen and pelvis was performed
using the standard protocol during bolus administration of
intravenous contrast. Multiplanar reconstructed images and MIPs were
obtained and reviewed to evaluate the vascular anatomy.
CONTRAST:  100mL OMNIPAQUE IOHEXOL 350 MG/ML SOLN

[Series 6: arterial 3.0 · axial · arterial · 0.75mm/px · z∈[-856,-745]mm · 3 of 148 slices shown]
[im 13/148  soft-tissue]
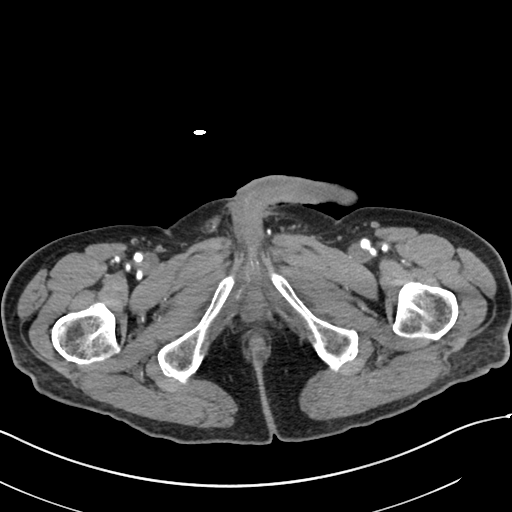
[im 37/148  soft-tissue]
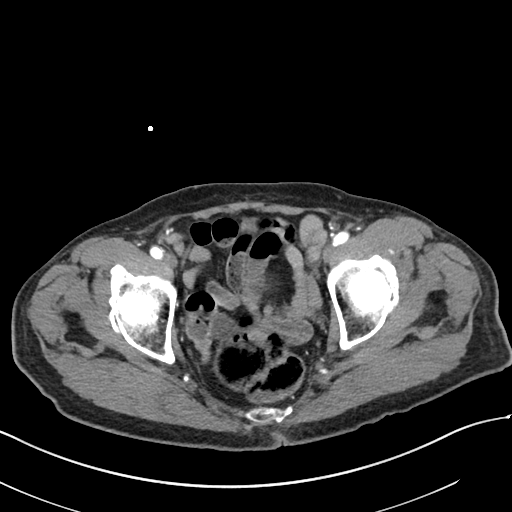
[im 50/148  soft-tissue]
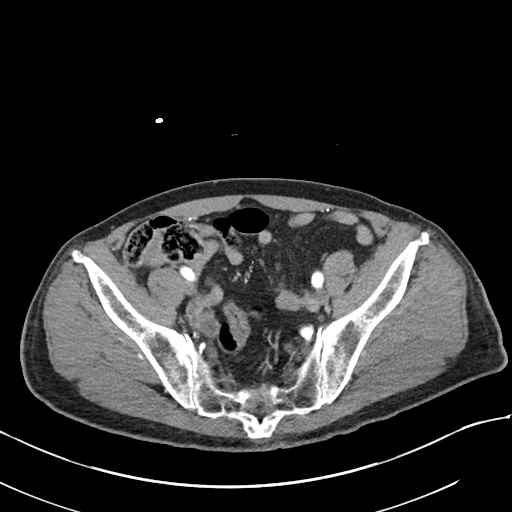

[Series 7: portal venous · axial · portal-venous · 0.75mm/px · z∈[-831,-516]mm · 6 of 89 slices shown, 11 images]
[im 13/89  soft-tissue]
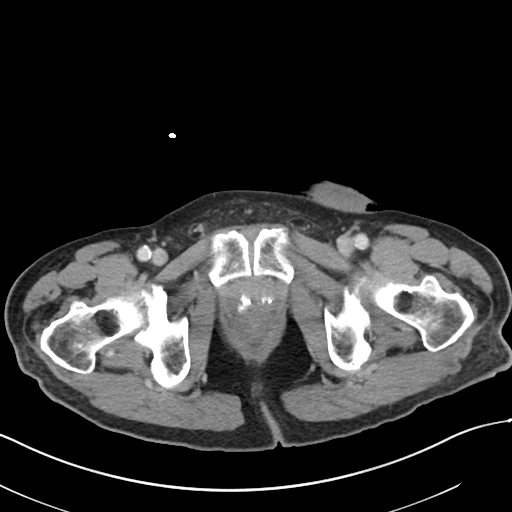
[im 13/89  bone]
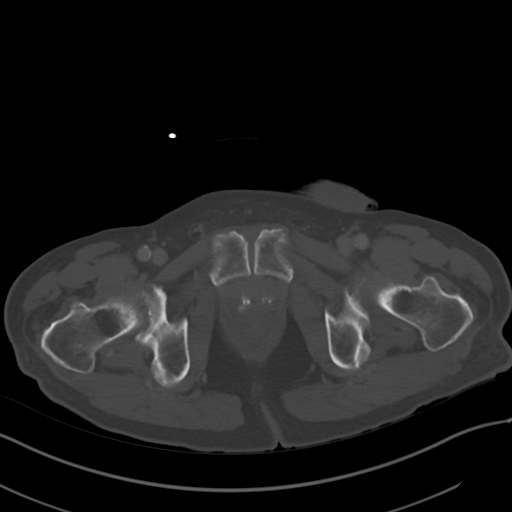
[im 26/89  soft-tissue]
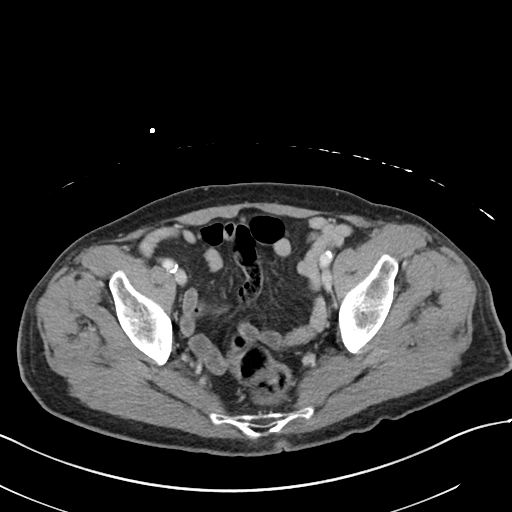
[im 38/89  soft-tissue]
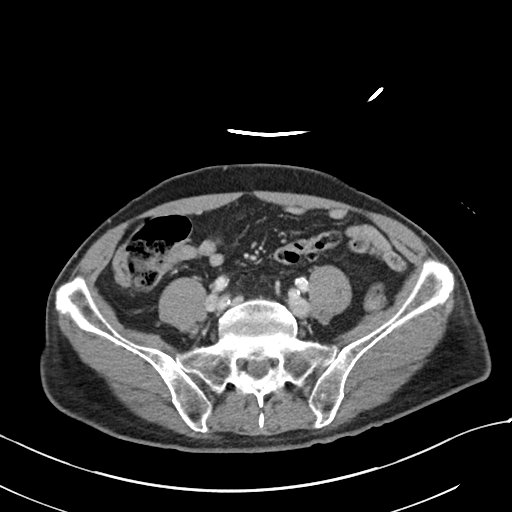
[im 38/89  lung]
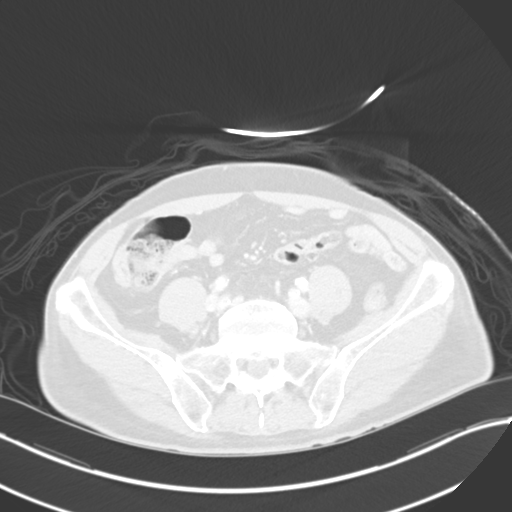
[im 51/89  soft-tissue]
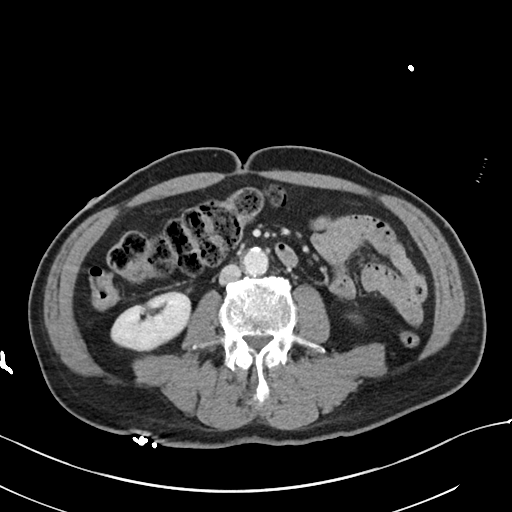
[im 51/89  lung]
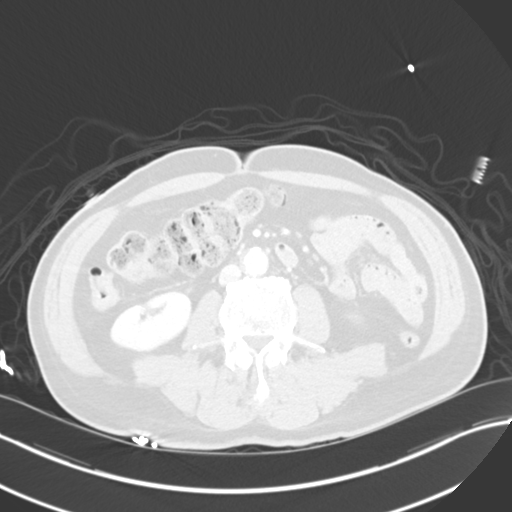
[im 63/89  soft-tissue]
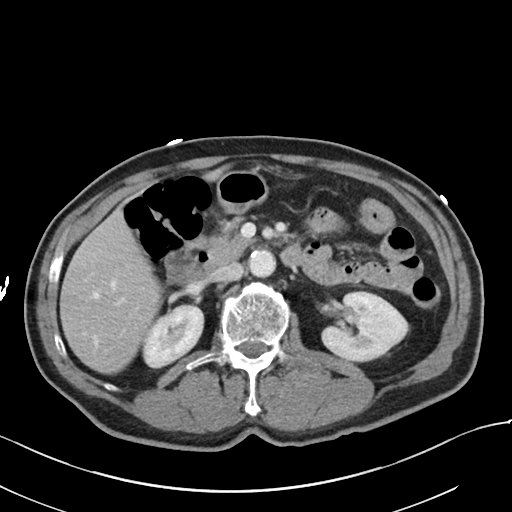
[im 63/89  lung]
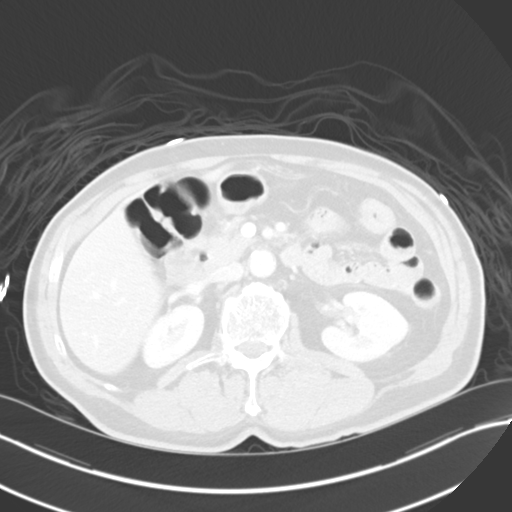
[im 76/89  soft-tissue]
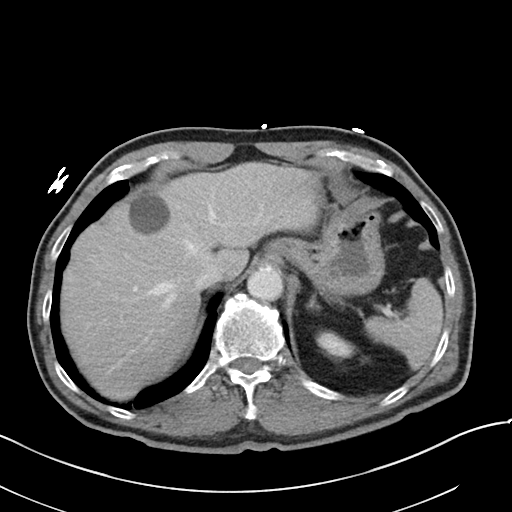
[im 76/89  lung]
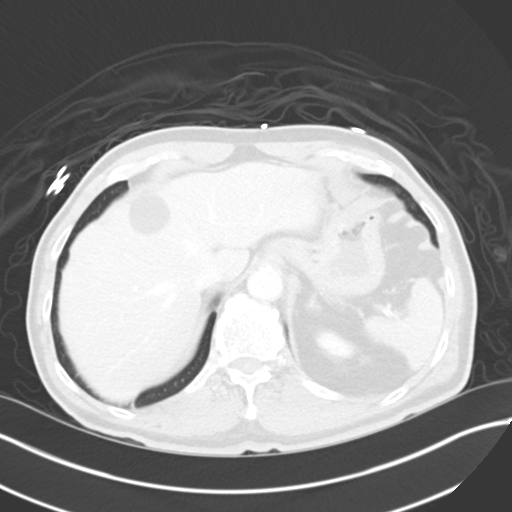

[Series 9: arterial cor · coronal · arterial · 0.73mm/px · 2 of 138 slices shown, 3 images]
[im 46/138  soft-tissue]
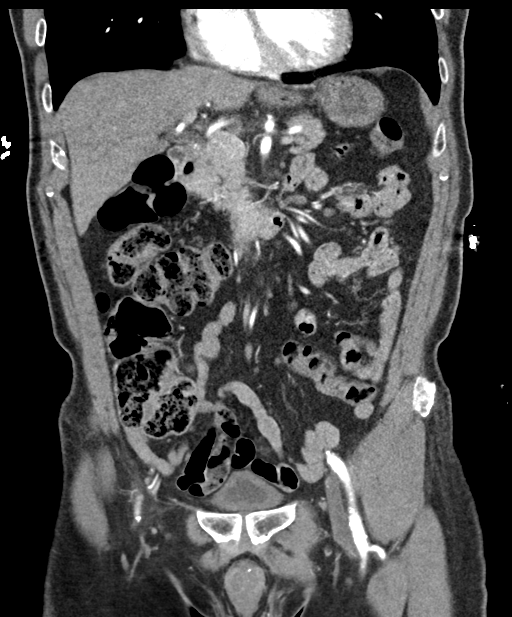
[im 46/138  bone]
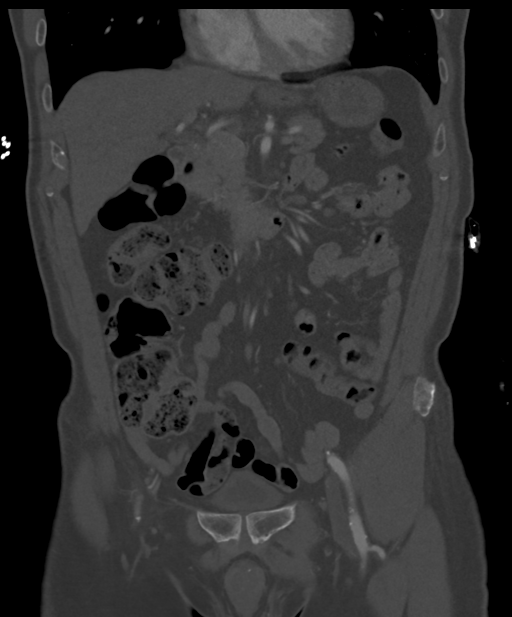
[im 92/138  soft-tissue]
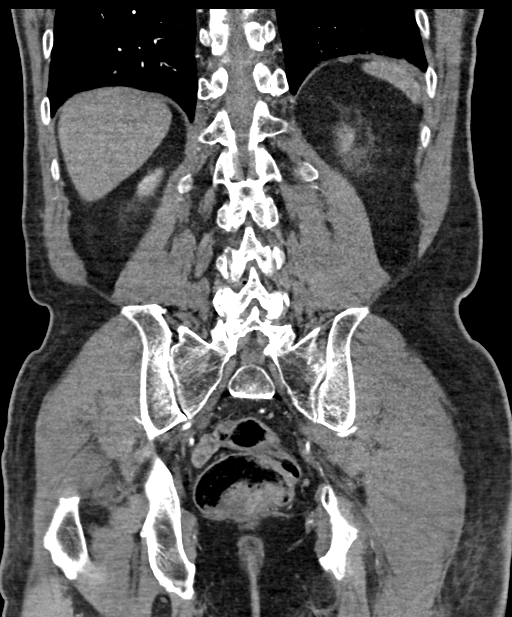

[11 of 46 positions shown; findings below may reference images not displayed]

FINDINGS: VASCULAR

Aorta: Normal caliber aorta without aneurysm, dissection, vasculitis
or significant stenosis. Scattered atherosclerotic calcifications
and fibrofatty plaque most prominent near the bifurcation.

Celiac: Mild ostial stenosis secondary to atherosclerotic plaque.
Patent distally.

SMA: Patent without evidence of aneurysm, dissection, vasculitis or
significant stenosis.

Renals: Single renal arteries are patent without evidence of
aneurysm, dissection, vasculitis, fibromuscular dysplasia or
significant stenosis.

IMA: Patent without evidence of aneurysm, dissection, vasculitis or
significant stenosis.

Inflow: Patent without evidence of aneurysm, dissection, vasculitis
or significant stenosis.

Proximal Outflow: Bilateral common femoral and visualized portions
of the superficial and profunda femoral arteries are patent without
evidence of aneurysm, dissection, vasculitis or significant
stenosis.

Veins: The hepatic veins are patent. The portal system is widely
patent and normal in caliber. The renal veins are patent with normal
anatomic configuration. No evidence of iliocaval thrombosis or
anatomic anomaly.

Review of the MIP images confirms the above findings.

NON-VASCULAR

Lower chest: No acute abnormality.

Hepatobiliary: The liver is normal in size, contour, and
attenuation. There is a simple cyst in segment 4 measuring up to
cm. The gallbladder is present unremarkable. No intra or
extrahepatic biliary ductal dilation.

Pancreas: Unremarkable. No pancreatic ductal dilatation or
surrounding inflammatory changes.

Spleen: Normal in size without focal abnormality.

Adrenals/Urinary Tract: Adrenal glands are unremarkable. Kidneys are
normal, without renal calculi, focal lesion, or hydronephrosis.
Bladder is decompressed.

Stomach/Bowel: Stomach is within normal limits. Appendix is not
definitively identified. No evidence of bowel wall thickening,
distention, or inflammatory changes. There are a few scattered
sigmoid colonic diverticula without surrounding inflammatory
changes. No evidence of intraluminal hemorrhage.

Lymphatic: No abdominopelvic lymphadenopathy.

Reproductive: The prostate is enlarged measuring up to 6 cm in
greatest axial dimension

Other: No abdominal wall hernia or abnormality. No abdominopelvic
ascites.

Musculoskeletal: No acute or significant osseous findings.
IMPRESSION: VASCULAR

No evidence of acute gastrointestinal hemorrhage, as queried. Aortic
Atherosclerosis ([id number]).

NON-VASCULAR

1. No acute abdominopelvic abnormality.
2. Prostatomegaly.

## 2022-08-28 ENCOUNTER — Other Ambulatory Visit: Payer: Self-pay | Admitting: Family Medicine

## 2022-08-30 NOTE — Telephone Encounter (Signed)
Due to a glitch in the system the protocol does not correctly detect the last office visit for this practice.    LOV 06/03/2022.  Has an appt coming up 12/06/2022.  Per provider note to continue this medication so #90, 3 refills given.  Requested Prescriptions  Pending Prescriptions Disp Refills   lisinopril (ZESTRIL) 40 MG tablet [Pharmacy Med Name: LISINOPRIL 40 MG TABLET] 90 tablet 0    Sig: TAKE 1 TABLET BY MOUTH EVERY DAY     Cardiovascular:  ACE Inhibitors Failed - 08/28/2022  1:14 AM      Failed - Last BP in normal range    BP Readings from Last 1 Encounters:  07/20/22 (!) 150/80         Failed - Valid encounter within last 6 months    Recent Outpatient Visits           1 year ago Benign essential HTN   [facility] Donita Brooks, MD   1 year ago Pulmonary nodule 1 cm or greater in diameter   [facility] Pickard, Priscille Heidelberg, MD   2 years ago Gastrointestinal hemorrhage, unspecified gastrointestinal hemorrhage type   [facility] Donita Brooks, MD   2 years ago Routine general medical examination at a health care facility   [facility] [place], Velna Hatchet, MD   2 years ago Essential hypertension   [facility] [place], Velna Hatchet, MD              Passed - Cr in normal range and within 180 days    Creat  Date Value Ref Range Status  06/03/2022 0.77 0.70 - 1.22 mg/dL Final   Creatinine, Urine  Date Value Ref Range Status  09/17/2019 102 20 - 320 mg/dL Final         Passed - K in normal range and within 180 days    Potassium  Date Value Ref Range Status  06/03/2022 4.0 3.5 - 5.3 mmol/L Final         Passed - Patient is not pregnant

## 2022-10-21 ENCOUNTER — Ambulatory Visit (INDEPENDENT_AMBULATORY_CARE_PROVIDER_SITE_OTHER): Payer: Medicare Other | Admitting: Emergency Medicine

## 2022-10-21 VITALS — Ht 68.0 in | Wt 157.0 lb

## 2022-10-21 DIAGNOSIS — Z0000 Encounter for general adult medical examination without abnormal findings: Secondary | ICD-10-CM

## 2022-10-21 NOTE — Progress Notes (Signed)
Subjective:   Ryan Mosley is a 82 y.o. male who presents for Medicare Annual/Subsequent preventive examination.  Visit Complete: Virtual  I connected with  Caryl Comes on 10/21/22 by a audio enabled telemedicine application and verified that I am speaking with the correct person using two identifiers.  Patient Location: Home  Provider Location: Home Office  I discussed the limitations of evaluation and management by telemedicine. The patient expressed understanding and agreed to proceed. Vital Signs: Because this visit was a virtual/telehealth visit, some criteria may be missing or patient reported. Any vitals not documented were not able to be obtained and vitals that have been documented are patient reported.    Review of Systems     Cardiac Risk Factors include: advanced age (>64men, >16 women);dyslipidemia;hypertension;diabetes mellitus;male gender     Objective:    Today's Vitals   10/21/22 1123  Weight: 157 lb (71.2 kg)  Height: 5\' 8"  (1.727 m)   Body mass index is 23.87 kg/m.     10/21/2022   11:41 AM 03/26/2021   11:25 AM 10/07/2020    8:36 AM 09/25/2020    6:00 PM 09/16/2020    8:58 AM 08/16/2020   10:33 AM 07/08/2020   12:32 AM  Advanced Directives  Does Patient Have a Medical Advance Directive? No No No No No No No  Does patient want to make changes to medical advance directive? Yes (MAU/Ambulatory/Procedural Areas - Information given)        Would patient like information on creating a medical advance directive?  No - Patient declined No - Patient declined No - Patient declined No - Patient declined No - Patient declined No - Patient declined    Current Medications (verified) Outpatient Encounter Medications as of 10/21/2022  Medication Sig   acetaminophen (TYLENOL) 325 MG tablet Take 650 mg by mouth daily.   amLODipine (NORVASC) 5 MG tablet TAKE 1 TABLET BY MOUTH EVERY DAY FOR BLOOD PRESSURE   latanoprost (XALATAN) 0.005 % ophthalmic solution Place 1 drop  into both eyes at bedtime.   lisinopril (ZESTRIL) 40 MG tablet TAKE 1 TABLET BY MOUTH EVERY DAY   rosuvastatin (CRESTOR) 5 MG tablet TAKE 1 TABLET BY MOUTH ONCE A WEEK FOR CHOLESTEROL   No facility-administered encounter medications on file as of 10/21/2022.    Allergies (verified) Metformin   History: Past Medical History:  Diagnosis Date   Acute blood loss anemia    on meds   Blood transfusion without reported diagnosis 2022   Cataract    bilateral sx   Controlled diabetes mellitus type 2 with complications (HCC)    Diabetes mellitus without complication (HCC)    not on meds at this time   GERD (gastroesophageal reflux disease)    hx of   GI bleed    Hyperlipidemia    on meds   Hypertension    on meds   Meningitis 1960/1970   Pneumonia 1960/1970   Rectal polyp    Past Surgical History:  Procedure Laterality Date   BIOPSY  07/08/2020   Procedure: BIOPSY;  Surgeon: Tressia Danas, MD;  Location: [facility] ENDOSCOPY;  Service: Gastroenterology;;   COLONOSCOPY WITH PROPOFOL N/A 07/08/2020   Procedure: COLONOSCOPY WITH PROPOFOL;  Surgeon: Tressia Danas, MD;  Location: [facility] ENDOSCOPY;  Service: Gastroenterology;  Laterality: N/A;   DENTAL SURGERY     all teeth removed   XI ROBOT ASSISTED TRANSANAL RESECTION N/A 09/25/2020   Procedure: TAMIS PARTIAL PROCTECTOMY OF RECTAL MASS;  Surgeon: Karie Soda, MD;  Location:  [facility] ORS;  Service: General;  Laterality: N/A;   Family History  Problem Relation Age of Onset   Other Mother        unknown medical history   Other Father        unknown medical history   Seizures Daughter    Diabetes Son    Colon polyps Neg Hx    Colon cancer Neg Hx    Esophageal cancer Neg Hx    Rectal cancer Neg Hx    Stomach cancer Neg Hx    Social History   Socioeconomic History   Marital status: Married    Spouse name: Corene   Number of children: 4   Years of education: 7   Highest education level: Not on file  Occupational History   Occupation:  retired    Comment: farmer  Tobacco Use   Smoking status: Former    Current packs/day: 0.00    Average packs/day: 1 pack/day for 29.0 years (29.0 ttl pk-yrs)    Types: Cigarettes    Start date: 65    Quit date: 1989    Years since quitting: 35.6   Smokeless tobacco: Current    Types: Chew  Vaping Use   Vaping status: Never Used  Substance and Sexual Activity   Alcohol use: Not Currently    Alcohol/week: 1.0 standard drink of alcohol    Types: 1 Standard drinks or equivalent per week   Drug use: Never   Sexual activity: Yes  Other Topics Concern   Not on file  Social History Narrative   Married, 4 children living in [place] and [place]   Social Determinants of Health   Financial Resource Strain: Low Risk  (10/21/2022)   Overall Financial Resource Strain (CARDIA)    Difficulty of Paying Living Expenses: Not hard at all  Food Insecurity: No Food Insecurity (10/21/2022)   Hunger Vital Sign    Worried About Running Out of Food in the Last Year: Never true    Ran Out of Food in the Last Year: Never true  Transportation Needs: No Transportation Needs (10/21/2022)   PRAPARE - Administrator, Civil Service (Medical): No    Lack of Transportation (Non-Medical): No  Physical Activity: Sufficiently Active (10/21/2022)   Exercise Vital Sign    Days of Exercise per Week: 5 days    Minutes of Exercise per Session: 30 min  Stress: No Stress Concern Present (10/21/2022)   [employer] of Occupational Health - Occupational Stress Questionnaire    Feeling of Stress : Not at all  Social Connections: Socially Isolated (10/21/2022)   Social Connection and Isolation Panel [NHANES]    Frequency of Communication with Friends and Family: Never    Frequency of Social Gatherings with Friends and Family: Twice a week    Attends Religious Services: Never    Database administrator or Organizations: No    Attends Engineer, structural: Never    Marital Status: Married     Tobacco Counseling Ready to quit: Not Answered Counseling given: Not Answered   Clinical Intake:  Pre-visit preparation completed: Yes  Pain : No/denies pain     BMI - recorded: 23.87 Nutritional Status: BMI of 19-24  Normal Nutritional Risks: None Diabetes: Yes CBG done?: No (FBS 97 per patient) Did pt. bring in CBG monitor from home?: No  How often do you need to have someone help you when you read instructions, pamphlets, or other written materials from your doctor or pharmacy?: 3 -  Sometimes What is the last grade level you completed in school?: 7th grade  Interpreter Needed?: No  Information entered by :: Tora Kindred, CMA   Activities of Daily Living    10/21/2022   11:28 AM  In your present state of health, do you have any difficulty performing the following activities:  Hearing? 0  Vision? 0  Difficulty concentrating or making decisions? 0  Walking or climbing stairs? 0  Dressing or bathing? 0  Doing errands, shopping? 0  Preparing Food and eating ? N  Using the Toilet? N  In the past six months, have you accidently leaked urine? N  Do you have problems with loss of bowel control? N  Managing your Medications? N  Managing your Finances? N  Housekeeping or managing your Housekeeping? N    Patient Care Team: Donita Brooks, MD as PCP - General (Family Medicine) Erroll Luna, [facility] (Inactive) as Pharmacist (Pharmacist) Malachy Mood, MD as Consulting Physician (Hematology) Karie Soda, MD as Consulting Physician (General Surgery) Tressia Danas, MD (Inactive) as Consulting Physician (Gastroenterology) Dorothy Puffer, MD as Consulting Physician (Radiation Oncology) Malachy Mood, MD as Consulting Physician (Oncology) Myrtie Neither Andreas Blower, MD as Consulting Physician (Gastroenterology) Jethro Bolus, MD as Consulting Physician (Ophthalmology)  Indicate any recent Medical Services you may have received from other than [facility] providers in the past year  (date may be approximate).     Assessment:   This is a routine wellness examination for Ryan Mosley.  Hearing/Vision screen Hearing Screening - Comments:: Denies hearing loss Vision Screening - Comments:: Gets routine eye exams  Dietary issues and exercise activities discussed:     Goals Addressed               This Visit's Progress     Patient Stated (pt-stated)        Continue to stay active      Depression Screen    10/21/2022   11:39 AM 10/12/2021    8:05 AM 03/26/2021   11:22 AM 11/20/2020    8:20 AM 03/17/2020    8:23 AM 09/17/2019    8:17 AM 03/19/2019    8:30 AM  PHQ 2/9 Scores  PHQ - 2 Score 0 0 0 0 0 0 0  PHQ- 9 Score 0 0   2  0    Fall Risk    10/21/2022   11:43 AM 10/12/2021    8:04 AM 03/26/2021   11:25 AM 11/20/2020    8:15 AM 03/17/2020    8:22 AM  Fall Risk   Falls in the past year? 0 0 0 0 1  Number falls in past yr: 0 0 0 0 0  Injury with Fall? 0 0 0 0 0  Risk for fall due to : No Fall Risks  No Fall Risks No Fall Risks No Fall Risks  Follow up Falls prevention discussed  Falls prevention discussed Falls evaluation completed Falls evaluation completed    MEDICARE RISK AT HOME: Medicare Risk at Home Any stairs in or around the home?: Yes If so, are there any without handrails?: No Home free of loose throw rugs in walkways, pet beds, electrical cords, etc?: Yes Adequate lighting in your home to reduce risk of falls?: Yes Life alert?: No Use of a cane, walker or w/c?: No Grab bars in the bathroom?: No Shower chair or bench in shower?: Yes Elevated toilet seat or a handicapped toilet?: No  TIMED UP AND GO:  Was the test performed?  No  Cognitive Function:        10/21/2022   11:48 AM  6CIT Screen  What Year? 0 points  What month? 0 points  What time? 0 points  Count back from 20 2 points  Months in reverse 4 points  Repeat phrase 0 points  Total Score 6 points    Immunizations Immunization History  Administered Date(s) Administered    Fluad Quad(high Dose 65+) 11/17/2019   Influenza, High Dose Seasonal PF 11/21/2016, 12/20/2017, 11/15/2018   Influenza-Unspecified 11/18/2020   Moderna Sars-Covid-2 Vaccination 01/08/2020   PFIZER(Purple Top)SARS-COV-2 Vaccination 05/25/2019, 06/22/2019, 02/19/2021    TDAP status: Due, Education has been provided regarding the importance of this vaccine. Advised may receive this vaccine at local pharmacy or Health Dept. Aware to provide a copy of the vaccination record if obtained from local pharmacy or Health Dept. Verbalized acceptance and understanding.  Flu Vaccine status: Due, Education has been provided regarding the importance of this vaccine. Advised may receive this vaccine at local pharmacy or Health Dept. Aware to provide a copy of the vaccination record if obtained from local pharmacy or Health Dept. Verbalized acceptance and understanding.  Pneumococcal vaccine status: Declined,  Education has been provided regarding the importance of this vaccine but patient still declined. Advised may receive this vaccine at local pharmacy or Health Dept. Aware to provide a copy of the vaccination record if obtained from local pharmacy or Health Dept. Verbalized acceptance and understanding.   Covid-19 vaccine status: Declined, Education has been provided regarding the importance of this vaccine but patient still declined. Advised may receive this vaccine at local pharmacy or Health Dept.or vaccine clinic. Aware to provide a copy of the vaccination record if obtained from local pharmacy or Health Dept. Verbalized acceptance and understanding.  Qualifies for Shingles Vaccine? Yes   Zostavax completed No   Shingrix Completed?: No.    Education has been provided regarding the importance of this vaccine. Patient has been advised to call insurance company to determine out of pocket expense if they have not yet received this vaccine. Advised may also receive vaccine at local pharmacy or Health Dept.  Verbalized acceptance and understanding.  Screening Tests Health Maintenance  Topic Date Due   DTaP/Tdap/Td (1 - Tdap) Never done   Zoster Vaccines- Shingrix (1 of 2) Never done   Pneumonia Vaccine 69+ Years old (1 of 1 - PCV) Never done   Diabetic kidney evaluation - Urine ACR  09/16/2020   OPHTHALMOLOGY EXAM  02/24/2021   FOOT EXAM  03/17/2021   COVID-19 Vaccine (5 - 2023-24 season) 10/23/2021   HEMOGLOBIN A1C  03/03/2022   INFLUENZA VACCINE  09/23/2022   Diabetic kidney evaluation - eGFR measurement  06/03/2023   Medicare Annual Wellness (AWV)  10/21/2023   HPV VACCINES  Aged Out   Hepatitis C Screening  Discontinued    Health Maintenance  Health Maintenance Due  Topic Date Due   DTaP/Tdap/Td (1 - Tdap) Never done   Zoster Vaccines- Shingrix (1 of 2) Never done   Pneumonia Vaccine 41+ Years old (1 of 1 - PCV) Never done   Diabetic kidney evaluation - Urine ACR  09/16/2020   OPHTHALMOLOGY EXAM  02/24/2021   FOOT EXAM  03/17/2021   COVID-19 Vaccine (5 - 2023-24 season) 10/23/2021   HEMOGLOBIN A1C  03/03/2022   INFLUENZA VACCINE  09/23/2022    Colorectal cancer screening: No longer required.   Lung Cancer Screening: (Low Dose CT Chest recommended if Age 27-80 years, 20 pack-year currently smoking  OR have quit w/in 15years.) does not qualify.   Lung Cancer Screening Referral: n/a  Additional Screening:  Hepatitis C Screening: does not qualify; Completed 09/17/19  Vision Screening: Recommended annual ophthalmology exams for early detection of glaucoma and other disorders of the eye. Is the patient up to date with their annual eye exam?  Yes  Who is the provider or what is the name of the office in which the patient attends annual eye exams? Dr Jethro Bolus If pt is not established with a provider, would they like to be referred to a provider to establish care? No .   Dental Screening: Recommended annual dental exams for proper oral hygiene  Diabetic Foot Exam:  Diabetic Foot Exam: Overdue, Pt has been advised about the importance in completing this exam. Pt is scheduled for diabetic foot exam on 12/06/22.  Community Resource Referral / Chronic Care Management: CRR required this visit?  No   CCM required this visit?  No     Plan:     I have personally reviewed and noted the following in the patient's chart:   Medical and social history Use of alcohol, tobacco or illicit drugs  Current medications and supplements including opioid prescriptions. Patient is not currently taking opioid prescriptions. Functional ability and status Nutritional status Physical activity Advanced directives List of other physicians Hospitalizations, surgeries, and ER visits in previous 12 months Vitals Screenings to include cognitive, depression, and falls Referrals and appointments  In addition, I have reviewed and discussed with patient certain preventive protocols, quality metrics, and best practice recommendations. A written personalized care plan for preventive services as well as general preventive health recommendations were provided to patient.     Tora Kindred, CMA   10/21/2022   After Visit Summary: (Pick Up) Due to this being a telephonic visit, with patients personalized plan was offered to patient and patient has requested to Pick up at office.  Nurse Notes:  6 CIT Score - 6 Patient declined Tdap, pneumonia, covid and shingles vaccines. Will get the flu shot in the fall.  FYI: Patient states he is not diabetic and has not taken any medications for diabetes in the last 4-5 years. Is past due on all diabetic screenings.

## 2022-10-21 NOTE — Patient Instructions (Addendum)
Mr. Zaldivar , Thank you for taking time to come for your Medicare Wellness Visit. I appreciate your ongoing commitment to your health goals. Please review the following plan we discussed and let me know if I can assist you in the future.   Referrals/Orders/Follow-Ups/Clinician Recommendations: Get the flu shot in the fall.  This is a list of the screening recommended for you and due dates:  Health Maintenance  Topic Date Due   DTaP/Tdap/Td vaccine (1 - Tdap) Never done   Zoster (Shingles) Vaccine (1 of 2) Never done   Pneumonia Vaccine (1 of 1 - PCV) Never done   Yearly kidney health urinalysis for diabetes  09/16/2020   Eye exam for diabetics  02/24/2021   Complete foot exam   03/17/2021   COVID-19 Vaccine (5 - 2023-24 season) 10/23/2021   Hemoglobin A1C  03/03/2022   Flu Shot  09/23/2022   Yearly kidney function blood test for diabetes  06/03/2023   Medicare Annual Wellness Visit  10/21/2023   HPV Vaccine  Aged Out   Hepatitis C Screening  Discontinued    Advanced directives: (ACP Link)Information on Advanced Care Planning can be found at [facility] Advance Health Care Directives Advance Health Care Directives ([email or web address]   Next Medicare Annual Wellness Visit scheduled for next year: Yes, 10/27/23 @ 11:15am

## 2022-11-20 ENCOUNTER — Observation Stay (HOSPITAL_COMMUNITY)
Admission: EM | Admit: 2022-11-20 | Discharge: 2022-11-21 | Disposition: A | Discharge status: Home / Self Care | Payer: Medicare Other | Attending: Family Medicine | Admitting: Family Medicine

## 2022-11-20 ENCOUNTER — Other Ambulatory Visit: Payer: Self-pay

## 2022-11-20 ENCOUNTER — Encounter (HOSPITAL_COMMUNITY): Payer: Self-pay

## 2022-11-20 ENCOUNTER — Emergency Department (HOSPITAL_COMMUNITY): Payer: Medicare Other

## 2022-11-20 DIAGNOSIS — K921 Melena: Secondary | ICD-10-CM | POA: Diagnosis not present

## 2022-11-20 DIAGNOSIS — K922 Gastrointestinal hemorrhage, unspecified: Principal | ICD-10-CM | POA: Diagnosis present

## 2022-11-20 DIAGNOSIS — E782 Mixed hyperlipidemia: Secondary | ICD-10-CM | POA: Diagnosis not present

## 2022-11-20 DIAGNOSIS — Z79899 Other long term (current) drug therapy: Secondary | ICD-10-CM | POA: Insufficient documentation

## 2022-11-20 DIAGNOSIS — J432 Centrilobular emphysema: Secondary | ICD-10-CM | POA: Diagnosis not present

## 2022-11-20 DIAGNOSIS — R933 Abnormal findings on diagnostic imaging of other parts of digestive tract: Secondary | ICD-10-CM | POA: Diagnosis not present

## 2022-11-20 DIAGNOSIS — R918 Other nonspecific abnormal finding of lung field: Secondary | ICD-10-CM | POA: Diagnosis not present

## 2022-11-20 DIAGNOSIS — K7689 Other specified diseases of liver: Secondary | ICD-10-CM | POA: Diagnosis not present

## 2022-11-20 DIAGNOSIS — K769 Liver disease, unspecified: Secondary | ICD-10-CM | POA: Diagnosis not present

## 2022-11-20 DIAGNOSIS — R350 Frequency of micturition: Secondary | ICD-10-CM

## 2022-11-20 DIAGNOSIS — I700 Atherosclerosis of aorta: Secondary | ICD-10-CM | POA: Diagnosis not present

## 2022-11-20 DIAGNOSIS — K6289 Other specified diseases of anus and rectum: Secondary | ICD-10-CM | POA: Diagnosis not present

## 2022-11-20 DIAGNOSIS — C20 Malignant neoplasm of rectum: Secondary | ICD-10-CM | POA: Diagnosis present

## 2022-11-20 DIAGNOSIS — I10 Essential (primary) hypertension: Secondary | ICD-10-CM | POA: Diagnosis present

## 2022-11-20 DIAGNOSIS — Z87891 Personal history of nicotine dependence: Secondary | ICD-10-CM | POA: Diagnosis not present

## 2022-11-20 DIAGNOSIS — N401 Enlarged prostate with lower urinary tract symptoms: Secondary | ICD-10-CM

## 2022-11-20 DIAGNOSIS — H401132 Primary open-angle glaucoma, bilateral, moderate stage: Secondary | ICD-10-CM | POA: Diagnosis not present

## 2022-11-20 DIAGNOSIS — E119 Type 2 diabetes mellitus without complications: Secondary | ICD-10-CM

## 2022-11-20 DIAGNOSIS — H401130 Primary open-angle glaucoma, bilateral, stage unspecified: Secondary | ICD-10-CM | POA: Diagnosis present

## 2022-11-20 DIAGNOSIS — N400 Enlarged prostate without lower urinary tract symptoms: Secondary | ICD-10-CM | POA: Diagnosis present

## 2022-11-20 DIAGNOSIS — R911 Solitary pulmonary nodule: Secondary | ICD-10-CM | POA: Diagnosis not present

## 2022-11-20 DIAGNOSIS — K625 Hemorrhage of anus and rectum: Secondary | ICD-10-CM | POA: Diagnosis not present

## 2022-11-20 DIAGNOSIS — E785 Hyperlipidemia, unspecified: Secondary | ICD-10-CM | POA: Diagnosis present

## 2022-11-20 LAB — COMPREHENSIVE METABOLIC PANEL
ALT: 18 U/L (ref 0–44)
AST: 22 U/L (ref 15–41)
Albumin: 3.4 g/dL — ABNORMAL LOW (ref 3.5–5.0)
Alkaline Phosphatase: 65 U/L (ref 38–126)
Anion gap: 13 (ref 5–15)
BUN: 11 mg/dL (ref 8–23)
CO2: 27 mmol/L (ref 22–32)
Calcium: 8.9 mg/dL (ref 8.9–10.3)
Chloride: 101 mmol/L (ref 98–111)
Creatinine, Ser: 0.92 mg/dL (ref 0.61–1.24)
GFR, Estimated: >60 mL/min (ref >60)
Glucose, Bld: 133 mg/dL — ABNORMAL HIGH (ref 70–99)
Potassium: 3 mmol/L — ABNORMAL LOW (ref 3.5–5.1)
Sodium: 141 mmol/L (ref 135–145)
Total Bilirubin: 0.6 mg/dL (ref 0.3–1.2)
Total Protein: 6.6 g/dL (ref 6.5–8.1)

## 2022-11-20 LAB — CBC
HCT: 36.2 % — ABNORMAL LOW (ref 39.0–52.0)
HCT: 35.5 % — ABNORMAL LOW (ref 39.0–52.0)
HCT: 35.2 % — ABNORMAL LOW (ref 39.0–52.0)
Hemoglobin: 11.8 g/dL — ABNORMAL LOW (ref 13.0–17.0)
Hemoglobin: 11.4 g/dL — ABNORMAL LOW (ref 13.0–17.0)
Hemoglobin: 11.3 g/dL — ABNORMAL LOW (ref 13.0–17.0)
MCH: 30.3 pg (ref 26.0–34.0)
MCH: 29.8 pg (ref 26.0–34.0)
MCH: 29.6 pg (ref 26.0–34.0)
MCHC: 32.6 g/dL (ref 30.0–36.0)
MCHC: 32.1 g/dL (ref 30.0–36.0)
MCHC: 32.1 g/dL (ref 30.0–36.0)
MCV: 92.8 fL (ref 80.0–100.0)
MCV: 92.7 fL (ref 80.0–100.0)
MCV: 92.1 fL (ref 80.0–100.0)
Platelets: 204 10*3/uL (ref 150–400)
Platelets: 210 10*3/uL (ref 150–400)
Platelets: 199 10*3/uL (ref 150–400)
RBC: 3.9 MIL/uL — ABNORMAL LOW (ref 4.22–5.81)
RBC: 3.83 MIL/uL — ABNORMAL LOW (ref 4.22–5.81)
RBC: 3.82 MIL/uL — ABNORMAL LOW (ref 4.22–5.81)
RDW: 12.3 % (ref 11.5–15.5)
RDW: 12.4 % (ref 11.5–15.5)
RDW: 12.1 % (ref 11.5–15.5)
WBC: 8.2 10*3/uL (ref 4.0–10.5)
WBC: 9.2 10*3/uL (ref 4.0–10.5)
WBC: 9.7 10*3/uL (ref 4.0–10.5)
nRBC: 0 % (ref 0.0–0.2)
nRBC: 0 % (ref 0.0–0.2)
nRBC: 0 % (ref 0.0–0.2)

## 2022-11-20 LAB — TYPE AND SCREEN
ABO/RH(D): A POS
Antibody Screen: NEGATIVE

## 2022-11-20 LAB — MAGNESIUM: Magnesium: 2.2 mg/dL (ref 1.7–2.4)

## 2022-11-20 LAB — GLUCOSE, CAPILLARY
Glucose-Capillary: 124 mg/dL — ABNORMAL HIGH (ref 70–99)
Glucose-Capillary: 77 mg/dL (ref 70–99)

## 2022-11-20 LAB — POC OCCULT BLOOD, ED: Fecal Occult Bld: POSITIVE — AB

## 2022-11-20 LAB — PROTIME-INR
INR: 1.1 (ref 0.8–1.2)
Prothrombin Time: 14.4 s (ref 11.4–15.2)

## 2022-11-20 MED ORDER — FLEET ENEMA RE ENEM
2.0000 | ENEMA | Freq: Once | RECTAL | Status: AC
  Administered 2022-11-21: 2 via RECTAL
  Filled 2022-11-20: qty 2

## 2022-11-20 MED ORDER — AMLODIPINE BESYLATE 5 MG PO TABS
5.0000 mg | ORAL_TABLET | Freq: Every day | ORAL | Status: DC
  Administered 2022-11-21: 5 mg via ORAL
  Filled 2022-11-20: qty 1

## 2022-11-20 MED ORDER — LISINOPRIL 20 MG PO TABS
40.0000 mg | ORAL_TABLET | Freq: Every day | ORAL | Status: DC
  Administered 2022-11-21: 40 mg via ORAL
  Filled 2022-11-20: qty 2

## 2022-11-20 MED ORDER — PANTOPRAZOLE SODIUM 40 MG IV SOLR
40.0000 mg | Freq: Once | INTRAVENOUS | Status: AC
  Administered 2022-11-20: 40 mg via INTRAVENOUS
  Filled 2022-11-20: qty 10

## 2022-11-20 MED ORDER — SODIUM CHLORIDE 0.9 % IV SOLN: Freq: Once | INTRAVENOUS | Status: AC

## 2022-11-20 MED ORDER — INSULIN ASPART 100 UNIT/ML IJ SOLN
0.0000 [IU] | Freq: Three times a day (TID) | INTRAMUSCULAR | Status: DC
  Administered 2022-11-20 – 2022-11-21 (×3): 1 [IU] via SUBCUTANEOUS

## 2022-11-20 MED ORDER — LATANOPROST 0.005 % OP SOLN
1.0000 [drp] | Freq: Every day | OPHTHALMIC | Status: DC
  Administered 2022-11-20: 1 [drp] via OPHTHALMIC
  Filled 2022-11-20: qty 2.5

## 2022-11-20 MED ORDER — POTASSIUM CHLORIDE 20 MEQ PO PACK
40.0000 meq | PACK | Freq: Once | ORAL | Status: AC
  Administered 2022-11-20: 40 meq via ORAL
  Filled 2022-11-20: qty 2

## 2022-11-20 MED ORDER — POLYETHYLENE GLYCOL 3350 17 G PO PACK: 17.0000 g | PACK | Freq: Every day | ORAL | Status: DC | PRN

## 2022-11-20 MED ORDER — ACETAMINOPHEN 650 MG RE SUPP: 650.0000 mg | Freq: Four times a day (QID) | RECTAL | Status: DC | PRN

## 2022-11-20 MED ORDER — ROSUVASTATIN CALCIUM 5 MG PO TABS: 5.0000 mg | ORAL_TABLET | ORAL | Status: DC

## 2022-11-20 MED ORDER — ACETAMINOPHEN 325 MG PO TABS: 650.0000 mg | ORAL_TABLET | Freq: Four times a day (QID) | ORAL | Status: DC | PRN

## 2022-11-20 MED ORDER — SODIUM CHLORIDE 0.9% FLUSH
3.0000 mL | Freq: Two times a day (BID) | INTRAVENOUS | Status: DC
  Administered 2022-11-20 – 2022-11-21 (×2): 3 mL via INTRAVENOUS

## 2022-11-20 MED ORDER — IOHEXOL 350 MG/ML SOLN
75.0000 mL | Freq: Once | INTRAVENOUS | Status: AC | PRN
  Administered 2022-11-20: 75 mL via INTRAVENOUS

## 2022-11-20 NOTE — Consult Note (Signed)
Reason for Consult: Hematochezia and rectal cancer. Referring Physician: [facility]  Caryl Comes HPI: This is an 82 year old male with a PMH of rectal cancer diagnosed on 06/2020 and s/p transanal resection 09/25/2020 admitted for complaints of hematochezia.  The patient started to have bleeding this past Thursday.  He had multiple episodes and then he presented to the ER.  Work up with a CBC in the ER showed that his HGB was in the 11 g/dL range, which is within his baseline values.  A repeat CT scan showed metastatic disease and an irregularity in the rectum.  The ER physician, per discussion, did palpate an abnormality.  In the past the patient was offered more extensive surgical treatment, which he declined.  He did change his mind about a transanal excision.  Past Medical History:  Diagnosis Date   Acute blood loss anemia    on meds   Blood transfusion without reported diagnosis 2022   Cataract    bilateral sx   Controlled diabetes mellitus type 2 with complications (HCC)    Diabetes mellitus without complication (HCC)    not on meds at this time   GERD (gastroesophageal reflux disease)    hx of   GI bleed    Hyperlipidemia    on meds   Hypertension    on meds   Meningitis 1960/1970   Pneumonia 1960/1970   Rectal polyp     Past Surgical History:  Procedure Laterality Date   BIOPSY  07/08/2020   Procedure: BIOPSY;  Surgeon: Tressia Danas, MD;  Location: [facility] ENDOSCOPY;  Service: Gastroenterology;;   COLONOSCOPY WITH PROPOFOL N/A 07/08/2020   Procedure: COLONOSCOPY WITH PROPOFOL;  Surgeon: Tressia Danas, MD;  Location: [facility] ENDOSCOPY;  Service: Gastroenterology;  Laterality: N/A;   DENTAL SURGERY     all teeth removed   XI ROBOT ASSISTED TRANSANAL RESECTION N/A 09/25/2020   Procedure: TAMIS PARTIAL PROCTECTOMY OF RECTAL MASS;  Surgeon: Karie Soda, MD;  Location: [facility] ORS;  Service: General;  Laterality: N/A;    Family History  Problem Relation Age of Onset   Other  Mother        unknown medical history   Other Father        unknown medical history   Seizures Daughter    Diabetes Son    Colon polyps Neg Hx    Colon cancer Neg Hx    Esophageal cancer Neg Hx    Rectal cancer Neg Hx    Stomach cancer Neg Hx     Social History:  reports that he quit smoking about 35 years ago. His smoking use included cigarettes. He started smoking about 64 years ago. He has a 29 pack-year smoking history. His smokeless tobacco use includes chew. He reports that he does not currently use alcohol after a past usage of about 1.0 standard drink of alcohol per week. He reports that he does not use drugs.  Allergies:  Allergies  Allergen Reactions   Glucophage [Metformin] Diarrhea    Medications: Scheduled:  [START ON 11/21/2022] amLODipine  5 mg Oral Daily   insulin aspart  0-9 Units Subcutaneous TID WC   latanoprost  1 drop Both Eyes QHS   [START ON 11/21/2022] lisinopril  40 mg Oral Daily   [START ON 11/22/2022] rosuvastatin  5 mg Oral Q Mon   sodium chloride flush  3 mL Intravenous Q12H   [START ON 11/21/2022] sodium phosphate  2 enema Rectal Once   Continuous:  Results for orders placed  or performed during the hospital encounter of 11/20/22 (from the past 24 hour(s))  CBC     Status: Abnormal   Collection Time: 11/20/22  8:29 AM  Result Value Ref Range   WBC 8.2 4.0 - 10.5 K/uL   RBC 3.90 (L) 4.22 - 5.81 MIL/uL   Hemoglobin 11.8 (L) 13.0 - 17.0 g/dL   HCT 91.4 (L) 78.2 - 95.6 %   MCV 92.8 80.0 - 100.0 fL   MCH 30.3 26.0 - 34.0 pg   MCHC 32.6 30.0 - 36.0 g/dL   RDW 21.3 08.6 - 57.8 %   Platelets 204 150 - 400 K/uL   nRBC 0.0 0.0 - 0.2 %  Comprehensive metabolic panel     Status: Abnormal   Collection Time: 11/20/22  8:29 AM  Result Value Ref Range   Sodium 141 135 - 145 mmol/L   Potassium 3.0 (L) 3.5 - 5.1 mmol/L   Chloride 101 98 - 111 mmol/L   CO2 27 22 - 32 mmol/L   Glucose, Bld 133 (H) 70 - 99 mg/dL   BUN 11 8 - 23 mg/dL   Creatinine, Ser 4.69  0.61 - 1.24 mg/dL   Calcium 8.9 8.9 - 62.9 mg/dL   Total Protein 6.6 6.5 - 8.1 g/dL   Albumin 3.4 (L) 3.5 - 5.0 g/dL   AST 22 15 - 41 U/L   ALT 18 0 - 44 U/L   Alkaline Phosphatase 65 38 - 126 U/L   Total Bilirubin 0.6 0.3 - 1.2 mg/dL   GFR, Estimated >52 >84 mL/min   Anion gap 13 5 - 15  Protime-INR     Status: None   Collection Time: 11/20/22  8:29 AM  Result Value Ref Range   Prothrombin Time 14.4 11.4 - 15.2 seconds   INR 1.1 0.8 - 1.2  Magnesium     Status: None   Collection Time: 11/20/22  8:29 AM  Result Value Ref Range   Magnesium 2.2 1.7 - 2.4 mg/dL  Type and screen [facility]     Status: None   Collection Time: 11/20/22 11:00 AM  Result Value Ref Range   ABO/RH(D) A POS    Antibody Screen NEG    Sample Expiration      11/23/2022,2359 Performed at [facility], [address] [place], [place] [address]   POC occult blood, ED     Status: Abnormal   Collection Time: 11/20/22 11:07 AM  Result Value Ref Range   Fecal Occult Bld POSITIVE (A) NEGATIVE  CBC     Status: Abnormal   Collection Time: 11/20/22  2:44 PM  Result Value Ref Range   WBC 9.2 4.0 - 10.5 K/uL   RBC 3.83 (L) 4.22 - 5.81 MIL/uL   Hemoglobin 11.4 (L) 13.0 - 17.0 g/dL   HCT 01.0 (L) 27.2 - 53.6 %   MCV 92.7 80.0 - 100.0 fL   MCH 29.8 26.0 - 34.0 pg   MCHC 32.1 30.0 - 36.0 g/dL   RDW 64.4 03.4 - 74.2 %   Platelets 210 150 - 400 K/uL   nRBC 0.0 0.0 - 0.2 %  Glucose, capillary     Status: Abnormal   Collection Time: 11/20/22  4:55 PM  Result Value Ref Range   Glucose-Capillary 124 (H) 70 - 99 mg/dL     CT Chest W Contrast  Result Date: 11/20/2022 CLINICAL DATA:  Abnormal chest x-ray.  Lung nodule.  GI bleed. EXAM: CT CHEST WITH CONTRAST TECHNIQUE:  Multidetector CT imaging of the chest was performed during intravenous contrast administration. RADIATION DOSE REDUCTION: This exam was performed according to the departmental dose-optimization program which includes automated  exposure control, adjustment of the mA and/or kV according to patient size and/or use of iterative reconstruction technique. CONTRAST:  75mL OMNIPAQUE IOHEXOL 350 MG/ML SOLN COMPARISON:  Chest radiographs 08/15/2020.  Chest CT 07/08/2020. FINDINGS: Cardiovascular: No acute vascular findings. Atherosclerosis of the aorta, great vessels and coronary arteries. The heart size is normal. There is no pericardial effusion. Mediastinum/Nodes: There are no enlarged mediastinal, hilar or axillary lymph nodes.Small mediastinal lymph nodes appear unchanged. The thyroid gland, trachea and esophagus demonstrate no significant findings. Lungs/Pleura: No pleural effusion or pneumothorax. 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 Previously demonstrated part solid nodule in the superior segment of the left lower lobe has significantly enlarged in the interval and developed irregular solid components. This measures 2.1 x 2.1 cm on image 65/12 and is highly concerning for adenocarcinoma. 3 mm left lower lobe nodule on image 132/12 is unchanged. 3 mm right lower lobe nodule on image 137/12 is unchanged. No other enlarging or new pulmonary nodules identified. Upper abdomen:  Abdominal findings dictated separately. Musculoskeletal/Chest wall: There is no chest wall mass or suspicious osseous finding. Mild spondylosis. Unless specific follow-up recommendations are mentioned in the findings or impression sections, no imaging follow-up of any mentioned incidental findings is recommended. IMPRESSION: 1. Significant enlargement of previously demonstrated part solid nodule in the superior segment of the left lower lobe with irregular solid components, highly concerning for adenocarcinoma based on morphology and evolution. Atypical metastasis possible given the abdominal findings. See separate abdominopelvic CTA report. 2. No other evidence of thoracic metastatic disease. 3. Abdominal findings dictated separately. 4.  Aortic Atherosclerosis (ICD10-I70.0) and Emphysema (ICD10-J43.9). Electronically Signed   By: Carey Bullocks M.D.   On: 11/20/2022 11:41   CT ANGIO GI BLEED  Result Date: 11/20/2022 CLINICAL DATA:  RECTAL BLEEDING. EXAM: CTA ABDOMEN AND PELVIS WITHOUT AND WITH CONTRAST TECHNIQUE: Multidetector CT imaging of the abdomen and pelvis was performed using the standard protocol during bolus administration of intravenous contrast. Multiplanar reconstructed images and MIPs were obtained and reviewed to evaluate the vascular anatomy. RADIATION DOSE REDUCTION: This exam was performed according to the departmental dose-optimization program which includes automated exposure control, adjustment of the mA and/or kV according to patient size and/or use of iterative reconstruction technique. CONTRAST:  75mL OMNIPAQUE IOHEXOL 350 MG/ML SOLN COMPARISON:  07/07/2020 FINDINGS: VASCULAR Aorta: Normal caliber aorta without aneurysm, dissection, vasculitis or significant stenosis. Moderate atherosclerosis. Celiac: Patent without evidence of aneurysm, dissection, vasculitis or significant stenosis. SMA: Patent without evidence of aneurysm, dissection, vasculitis or significant stenosis. Renals: Both renal arteries are patent without evidence of aneurysm, dissection, vasculitis, fibromuscular dysplasia or significant stenosis. IMA: Patent without evidence of aneurysm, dissection, vasculitis or significant stenosis. Inflow: Patent without evidence of aneurysm, dissection, vasculitis or significant stenosis. Proximal Outflow: Bilateral common femoral and visualized portions of the superficial and profunda femoral arteries are patent without evidence of aneurysm, dissection, vasculitis or significant stenosis. Veins: No obvious venous abnormality within the limitations of this arterial phase study. Review of the MIP images confirms the above findings. NON-VASCULAR Lower chest: See port for chest CT performed at the same time.  Hepatobiliary: 6.8 x 5.3 cm lesion in the anterior right liver shows peripheral irregular enhancement and is new since the prior study. Left hepatic lobe cyst is stable. There is no evidence for gallstones, gallbladder wall  thickening, or pericholecystic fluid. No intrahepatic or extrahepatic biliary dilation. Pancreas: No focal mass lesion. No dilatation of the main duct. No intraparenchymal cyst. No peripancreatic edema. Spleen: No splenomegaly. No suspicious focal mass lesion. Adrenals/Urinary Tract: No adrenal nodule or mass. Kidneys unremarkable. No evidence for hydroureter. Calcification is seen along the mucosal surface of the posterior left bladder wall and may reflect dependent stones or mucosal calcification. Stomach/Bowel: Stomach is unremarkable. No gastric wall thickening. No evidence of outlet obstruction. No evidence for contrast extravasation into the stomach. Duodenum is normally positioned as is the ligament of Treitz. No small bowel wall thickening. No small bowel dilatation. No evidence for contrast extravasation in the small bowel loops. No evidence for active extravasation in the colon. 2 foci of hyperintensity are seen on arterial phase imaging, 1 in the right colon (98/7) and 1 in the transverse colon (83/7) but both of these were present on precontrast imaging consistent with material in the fecal stream. There is irregular wall thickening in the rectum with heterogeneous enhancement in the rectum. Lymphatic: There is no gastrohepatic or hepatoduodenal ligament lymphadenopathy. No retroperitoneal or mesenteric lymphadenopathy. No pelvic sidewall lymphadenopathy. Reproductive: Prostate gland is enlarged. Other: No intraperitoneal free fluid. Musculoskeletal: No worrisome lytic or sclerotic osseous abnormality. IMPRESSION: 1. No evidence for active extravasation in the bowel. 2. Irregular wall thickening in the rectum with heterogeneous enhancement. Imaging features may be related to  infectious/inflammatory etiology or neoplasm. 3. 6.8 x 5.3 cm lesion in the anterior right liver shows peripheral irregular enhancement and is new since the prior study. Imaging features are highly suspicious for neoplasm and metastatic disease and primary hepatic cancer would both be considerations. MRI may provide some additional characterization, but tissue sampling likely warranted. 4. Calcification along the mucosal surface of the posterior left bladder wall may reflect dependent stones or mucosal calcification. Cystoscopy may be warranted to exclude a mucosal lesion in this area. 5. Prostatomegaly. 6. Moderate stool volume suggest clinical constipation. 7.  Aortic Atherosclerosis (ICD10-I70.0). Electronically Signed   By: Kennith Center M.D.   On: 11/20/2022 11:41    ROS:  As stated above in the HPI otherwise negative.  Blood pressure (!) 153/75, pulse 65, temperature 99.9 F (37.7 C), temperature source Oral, resp. rate 17, height 5\' 8"  (1.727 m), weight 64.2 kg, SpO2 99%.    PE: Gen: NAD, Alert and Oriented HEENT:  [place]/AT, EOMI Neck: Supple, no LAD Lungs: CTA Bilaterally CV: RRR without M/G/R ABD: Soft, NTND, +BS Ext: No C/C/E  Assessment/Plan: 1) Probable bleeding rectal cancer. 2) Metastatic disease. 3) Stable anemia.   The patient is willing to have an FFS.  The examination will be performed tomorrow and hemospray versus PuraStat will be applied.  This can help to control bleeding for the next 30 days.  Plan: 1) FFS tomorrow AM.  Jeani Hawking D 11/20/2022, 5:48 PM

## 2022-11-20 NOTE — Plan of Care (Signed)
Problem: Education: Goal: Knowledge of General Education information will improve Description Including pain rating scale, medication(s)/side effects and non-pharmacologic comfort measures Outcome: Progressing   Problem: Health Behavior/Discharge Planning: Goal: Ability to manage health-related needs will improve Outcome: Progressing   Problem: Activity: Goal: Risk for activity intolerance will decrease Outcome: Progressing   Problem: Coping: Goal: Level of anxiety will decrease Outcome: Progressing   Problem: Elimination: Goal: Will not experience complications related to urinary retention Outcome: Progressing   Problem: Pain Managment: Goal: General experience of comfort will improve Outcome: Progressing   Problem: Safety: Goal: Ability to remain free from injury will improve Outcome: Progressing   Problem: Skin Integrity: Goal: Risk for impaired skin integrity will decrease Outcome: Progressing

## 2022-11-20 NOTE — H&P (View-Only) (Signed)
Reason for Consult: Hematochezia and rectal cancer. Referring Physician: [facility]  Caryl Comes HPI: This is an 82 year old male with a PMH of rectal cancer diagnosed on 06/2020 and s/p transanal resection 09/25/2020 admitted for complaints of hematochezia.  The patient started to have bleeding this past Thursday.  He had multiple episodes and then he presented to the ER.  Work up with a CBC in the ER showed that his HGB was in the 11 g/dL range, which is within his baseline values.  A repeat CT scan showed metastatic disease and an irregularity in the rectum.  The ER physician, per discussion, did palpate an abnormality.  In the past the patient was offered more extensive surgical treatment, which he declined.  He did change his mind about a transanal excision.  Past Medical History:  Diagnosis Date   Acute blood loss anemia    on meds   Blood transfusion without reported diagnosis 2022   Cataract    bilateral sx   Controlled diabetes mellitus type 2 with complications (HCC)    Diabetes mellitus without complication (HCC)    not on meds at this time   GERD (gastroesophageal reflux disease)    hx of   GI bleed    Hyperlipidemia    on meds   Hypertension    on meds   Meningitis 1960/1970   Pneumonia 1960/1970   Rectal polyp     Past Surgical History:  Procedure Laterality Date   BIOPSY  07/08/2020   Procedure: BIOPSY;  Surgeon: Tressia Danas, MD;  Location: [facility] ENDOSCOPY;  Service: Gastroenterology;;   COLONOSCOPY WITH PROPOFOL N/A 07/08/2020   Procedure: COLONOSCOPY WITH PROPOFOL;  Surgeon: Tressia Danas, MD;  Location: [facility] ENDOSCOPY;  Service: Gastroenterology;  Laterality: N/A;   DENTAL SURGERY     all teeth removed   XI ROBOT ASSISTED TRANSANAL RESECTION N/A 09/25/2020   Procedure: TAMIS PARTIAL PROCTECTOMY OF RECTAL MASS;  Surgeon: Karie Soda, MD;  Location: [facility] ORS;  Service: General;  Laterality: N/A;    Family History  Problem Relation Age of Onset   Other  Mother        unknown medical history   Other Father        unknown medical history   Seizures Daughter    Diabetes Son    Colon polyps Neg Hx    Colon cancer Neg Hx    Esophageal cancer Neg Hx    Rectal cancer Neg Hx    Stomach cancer Neg Hx     Social History:  reports that he quit smoking about 35 years ago. His smoking use included cigarettes. He started smoking about 64 years ago. He has a 29 pack-year smoking history. His smokeless tobacco use includes chew. He reports that he does not currently use alcohol after a past usage of about 1.0 standard drink of alcohol per week. He reports that he does not use drugs.  Allergies:  Allergies  Allergen Reactions   Glucophage [Metformin] Diarrhea    Medications: Scheduled:  [START ON 11/21/2022] amLODipine  5 mg Oral Daily   insulin aspart  0-9 Units Subcutaneous TID WC   latanoprost  1 drop Both Eyes QHS   [START ON 11/21/2022] lisinopril  40 mg Oral Daily   [START ON 11/22/2022] rosuvastatin  5 mg Oral Q Mon   sodium chloride flush  3 mL Intravenous Q12H   [START ON 11/21/2022] sodium phosphate  2 enema Rectal Once   Continuous:  Results for orders placed  or performed during the hospital encounter of 11/20/22 (from the past 24 hour(s))  CBC     Status: Abnormal   Collection Time: 11/20/22  8:29 AM  Result Value Ref Range   WBC 8.2 4.0 - 10.5 K/uL   RBC 3.90 (L) 4.22 - 5.81 MIL/uL   Hemoglobin 11.8 (L) 13.0 - 17.0 g/dL   HCT 91.4 (L) 78.2 - 95.6 %   MCV 92.8 80.0 - 100.0 fL   MCH 30.3 26.0 - 34.0 pg   MCHC 32.6 30.0 - 36.0 g/dL   RDW 21.3 08.6 - 57.8 %   Platelets 204 150 - 400 K/uL   nRBC 0.0 0.0 - 0.2 %  Comprehensive metabolic panel     Status: Abnormal   Collection Time: 11/20/22  8:29 AM  Result Value Ref Range   Sodium 141 135 - 145 mmol/L   Potassium 3.0 (L) 3.5 - 5.1 mmol/L   Chloride 101 98 - 111 mmol/L   CO2 27 22 - 32 mmol/L   Glucose, Bld 133 (H) 70 - 99 mg/dL   BUN 11 8 - 23 mg/dL   Creatinine, Ser 4.69  0.61 - 1.24 mg/dL   Calcium 8.9 8.9 - 62.9 mg/dL   Total Protein 6.6 6.5 - 8.1 g/dL   Albumin 3.4 (L) 3.5 - 5.0 g/dL   AST 22 15 - 41 U/L   ALT 18 0 - 44 U/L   Alkaline Phosphatase 65 38 - 126 U/L   Total Bilirubin 0.6 0.3 - 1.2 mg/dL   GFR, Estimated >52 >84 mL/min   Anion gap 13 5 - 15  Protime-INR     Status: None   Collection Time: 11/20/22  8:29 AM  Result Value Ref Range   Prothrombin Time 14.4 11.4 - 15.2 seconds   INR 1.1 0.8 - 1.2  Magnesium     Status: None   Collection Time: 11/20/22  8:29 AM  Result Value Ref Range   Magnesium 2.2 1.7 - 2.4 mg/dL  Type and screen [facility]     Status: None   Collection Time: 11/20/22 11:00 AM  Result Value Ref Range   ABO/RH(D) A POS    Antibody Screen NEG    Sample Expiration      11/23/2022,2359 Performed at [facility], [address] [place], [place] [address]   POC occult blood, ED     Status: Abnormal   Collection Time: 11/20/22 11:07 AM  Result Value Ref Range   Fecal Occult Bld POSITIVE (A) NEGATIVE  CBC     Status: Abnormal   Collection Time: 11/20/22  2:44 PM  Result Value Ref Range   WBC 9.2 4.0 - 10.5 K/uL   RBC 3.83 (L) 4.22 - 5.81 MIL/uL   Hemoglobin 11.4 (L) 13.0 - 17.0 g/dL   HCT 01.0 (L) 27.2 - 53.6 %   MCV 92.7 80.0 - 100.0 fL   MCH 29.8 26.0 - 34.0 pg   MCHC 32.1 30.0 - 36.0 g/dL   RDW 64.4 03.4 - 74.2 %   Platelets 210 150 - 400 K/uL   nRBC 0.0 0.0 - 0.2 %  Glucose, capillary     Status: Abnormal   Collection Time: 11/20/22  4:55 PM  Result Value Ref Range   Glucose-Capillary 124 (H) 70 - 99 mg/dL     CT Chest W Contrast  Result Date: 11/20/2022 CLINICAL DATA:  Abnormal chest x-ray.  Lung nodule.  GI bleed. EXAM: CT CHEST WITH CONTRAST TECHNIQUE:  Multidetector CT imaging of the chest was performed during intravenous contrast administration. RADIATION DOSE REDUCTION: This exam was performed according to the departmental dose-optimization program which includes automated  exposure control, adjustment of the mA and/or kV according to patient size and/or use of iterative reconstruction technique. CONTRAST:  75mL OMNIPAQUE IOHEXOL 350 MG/ML SOLN COMPARISON:  Chest radiographs 08/15/2020.  Chest CT 07/08/2020. FINDINGS: Cardiovascular: No acute vascular findings. Atherosclerosis of the aorta, great vessels and coronary arteries. The heart size is normal. There is no pericardial effusion. Mediastinum/Nodes: There are no enlarged mediastinal, hilar or axillary lymph nodes.Small mediastinal lymph nodes appear unchanged. The thyroid gland, trachea and esophagus demonstrate no significant findings. Lungs/Pleura: No pleural effusion or pneumothorax. 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 Previously demonstrated part solid nodule in the superior segment of the left lower lobe has significantly enlarged in the interval and developed irregular solid components. This measures 2.1 x 2.1 cm on image 65/12 and is highly concerning for adenocarcinoma. 3 mm left lower lobe nodule on image 132/12 is unchanged. 3 mm right lower lobe nodule on image 137/12 is unchanged. No other enlarging or new pulmonary nodules identified. Upper abdomen:  Abdominal findings dictated separately. Musculoskeletal/Chest wall: There is no chest wall mass or suspicious osseous finding. Mild spondylosis. Unless specific follow-up recommendations are mentioned in the findings or impression sections, no imaging follow-up of any mentioned incidental findings is recommended. IMPRESSION: 1. Significant enlargement of previously demonstrated part solid nodule in the superior segment of the left lower lobe with irregular solid components, highly concerning for adenocarcinoma based on morphology and evolution. Atypical metastasis possible given the abdominal findings. See separate abdominopelvic CTA report. 2. No other evidence of thoracic metastatic disease. 3. Abdominal findings dictated separately. 4.  Aortic Atherosclerosis (ICD10-I70.0) and Emphysema (ICD10-J43.9). Electronically Signed   By: Carey Bullocks M.D.   On: 11/20/2022 11:41   CT ANGIO GI BLEED  Result Date: 11/20/2022 CLINICAL DATA:  RECTAL BLEEDING. EXAM: CTA ABDOMEN AND PELVIS WITHOUT AND WITH CONTRAST TECHNIQUE: Multidetector CT imaging of the abdomen and pelvis was performed using the standard protocol during bolus administration of intravenous contrast. Multiplanar reconstructed images and MIPs were obtained and reviewed to evaluate the vascular anatomy. RADIATION DOSE REDUCTION: This exam was performed according to the departmental dose-optimization program which includes automated exposure control, adjustment of the mA and/or kV according to patient size and/or use of iterative reconstruction technique. CONTRAST:  75mL OMNIPAQUE IOHEXOL 350 MG/ML SOLN COMPARISON:  07/07/2020 FINDINGS: VASCULAR Aorta: Normal caliber aorta without aneurysm, dissection, vasculitis or significant stenosis. Moderate atherosclerosis. Celiac: Patent without evidence of aneurysm, dissection, vasculitis or significant stenosis. SMA: Patent without evidence of aneurysm, dissection, vasculitis or significant stenosis. Renals: Both renal arteries are patent without evidence of aneurysm, dissection, vasculitis, fibromuscular dysplasia or significant stenosis. IMA: Patent without evidence of aneurysm, dissection, vasculitis or significant stenosis. Inflow: Patent without evidence of aneurysm, dissection, vasculitis or significant stenosis. Proximal Outflow: Bilateral common femoral and visualized portions of the superficial and profunda femoral arteries are patent without evidence of aneurysm, dissection, vasculitis or significant stenosis. Veins: No obvious venous abnormality within the limitations of this arterial phase study. Review of the MIP images confirms the above findings. NON-VASCULAR Lower chest: See port for chest CT performed at the same time.  Hepatobiliary: 6.8 x 5.3 cm lesion in the anterior right liver shows peripheral irregular enhancement and is new since the prior study. Left hepatic lobe cyst is stable. There is no evidence for gallstones, gallbladder wall  thickening, or pericholecystic fluid. No intrahepatic or extrahepatic biliary dilation. Pancreas: No focal mass lesion. No dilatation of the main duct. No intraparenchymal cyst. No peripancreatic edema. Spleen: No splenomegaly. No suspicious focal mass lesion. Adrenals/Urinary Tract: No adrenal nodule or mass. Kidneys unremarkable. No evidence for hydroureter. Calcification is seen along the mucosal surface of the posterior left bladder wall and may reflect dependent stones or mucosal calcification. Stomach/Bowel: Stomach is unremarkable. No gastric wall thickening. No evidence of outlet obstruction. No evidence for contrast extravasation into the stomach. Duodenum is normally positioned as is the ligament of Treitz. No small bowel wall thickening. No small bowel dilatation. No evidence for contrast extravasation in the small bowel loops. No evidence for active extravasation in the colon. 2 foci of hyperintensity are seen on arterial phase imaging, 1 in the right colon (98/7) and 1 in the transverse colon (83/7) but both of these were present on precontrast imaging consistent with material in the fecal stream. There is irregular wall thickening in the rectum with heterogeneous enhancement in the rectum. Lymphatic: There is no gastrohepatic or hepatoduodenal ligament lymphadenopathy. No retroperitoneal or mesenteric lymphadenopathy. No pelvic sidewall lymphadenopathy. Reproductive: Prostate gland is enlarged. Other: No intraperitoneal free fluid. Musculoskeletal: No worrisome lytic or sclerotic osseous abnormality. IMPRESSION: 1. No evidence for active extravasation in the bowel. 2. Irregular wall thickening in the rectum with heterogeneous enhancement. Imaging features may be related to  infectious/inflammatory etiology or neoplasm. 3. 6.8 x 5.3 cm lesion in the anterior right liver shows peripheral irregular enhancement and is new since the prior study. Imaging features are highly suspicious for neoplasm and metastatic disease and primary hepatic cancer would both be considerations. MRI may provide some additional characterization, but tissue sampling likely warranted. 4. Calcification along the mucosal surface of the posterior left bladder wall may reflect dependent stones or mucosal calcification. Cystoscopy may be warranted to exclude a mucosal lesion in this area. 5. Prostatomegaly. 6. Moderate stool volume suggest clinical constipation. 7.  Aortic Atherosclerosis (ICD10-I70.0). Electronically Signed   By: Kennith Center M.D.   On: 11/20/2022 11:41    ROS:  As stated above in the HPI otherwise negative.  Blood pressure (!) 153/75, pulse 65, temperature 99.9 F (37.7 C), temperature source Oral, resp. rate 17, height 5\' 8"  (1.727 m), weight 64.2 kg, SpO2 99%.    PE: Gen: NAD, Alert and Oriented HEENT:  [place]/AT, EOMI Neck: Supple, no LAD Lungs: CTA Bilaterally CV: RRR without M/G/R ABD: Soft, NTND, +BS Ext: No C/C/E  Assessment/Plan: 1) Probable bleeding rectal cancer. 2) Metastatic disease. 3) Stable anemia.   The patient is willing to have an FFS.  The examination will be performed tomorrow and hemospray versus PuraStat will be applied.  This can help to control bleeding for the next 30 days.  Plan: 1) FFS tomorrow AM.  Jeani Hawking D 11/20/2022, 5:48 PM

## 2022-11-20 NOTE — ED Triage Notes (Signed)
Pt came in via POV d/t rectal bleeding that he noted yesterday evening. Reports dark & bright red. He did have an operation on a mass on his bottom 2 years ago. Denies feeling light headed or dizzy, does endorse feeling sore in his groin area. A/Ox4, denies pain.
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
## 2022-11-20 NOTE — ED Notes (Signed)
ED TO INPATIENT HANDOFF REPORT  ED Nurse Name and Phone #: Dahlia Client [phone]  S Name/Age/Gender Ryan Mosley 82 y.o. male Room/Bed: 025C/025C  Code Status   Code Status: Full Code  Home/SNF/Other Home Patient oriented to: self, place, time, and situation Is this baseline? Yes   Triage Complete: Triage complete  Chief Complaint GI bleed [K92.2]  Triage Note Pt came in via POV d/t rectal bleeding that he noted yesterday evening. Reports dark & bright red. He did have an operation on a mass on his bottom 2 years ago. Denies feeling light headed or dizzy, does endorse feeling sore in his groin area. A/Ox4, denies pain.    Allergies Allergies  Allergen Reactions   Metformin Diarrhea    Level of Care/Admitting Diagnosis ED Disposition     ED Disposition  Admit   Condition  --   Comment  Hospital Area: [facility] [[id number]]  Level of Care: Telemetry Medical [104]  May place patient in observation at [facility] or [facility] if equivalent level of care is available:: No  Covid Evaluation: Asymptomatic - no recent exposure (last 10 days) testing not required  Diagnosis: GI bleed [248157]  Admitting Physician: Synetta Fail [[id number]]  Attending Physician: Synetta Fail [[id number]]          B Medical/Surgery History Past Medical History:  Diagnosis Date   Acute blood loss anemia    on meds   Blood transfusion without reported diagnosis 2022   Cataract    bilateral sx   Controlled diabetes mellitus type 2 with complications (HCC)    Diabetes mellitus without complication (HCC)    not on meds at this time   GERD (gastroesophageal reflux disease)    hx of   GI bleed    Hyperlipidemia    on meds   Hypertension    on meds   Meningitis 1960/1970   Pneumonia 1960/1970   Rectal polyp    Past Surgical History:  Procedure Laterality Date   BIOPSY  07/08/2020   Procedure: BIOPSY;  Surgeon: Tressia Danas, MD;  Location: [facility] ENDOSCOPY;   Service: Gastroenterology;;   COLONOSCOPY WITH PROPOFOL N/A 07/08/2020   Procedure: COLONOSCOPY WITH PROPOFOL;  Surgeon: Tressia Danas, MD;  Location: [facility] ENDOSCOPY;  Service: Gastroenterology;  Laterality: N/A;   DENTAL SURGERY     all teeth removed   XI ROBOT ASSISTED TRANSANAL RESECTION N/A 09/25/2020   Procedure: TAMIS PARTIAL PROCTECTOMY OF RECTAL MASS;  Surgeon: Karie Soda, MD;  Location: [facility] ORS;  Service: General;  Laterality: N/A;     A IV Location/Drains/Wounds Patient Lines/Drains/Airways Status     Active Line/Drains/Airways     Name Placement date Placement time Site Days   Peripheral IV 11/20/22 20 G Right Antecubital 11/20/22  0833  Antecubital  less than 1   Incision (Closed) 09/25/20 Rectum Other (Comment) 09/25/20  1411  -- 786            Intake/Output Last 24 hours No intake or output data in the 24 hours ending 11/20/22 1240  Labs/Imaging Results for orders placed or performed during the hospital encounter of 11/20/22 (from the past 48 hour(s))  CBC     Status: Abnormal   Collection Time: 11/20/22  8:29 AM  Result Value Ref Range   WBC 8.2 4.0 - 10.5 K/uL   RBC 3.90 (L) 4.22 - 5.81 MIL/uL   Hemoglobin 11.8 (L) 13.0 - 17.0 g/dL   HCT 57.8 (L) 46.9 - 62.9 %  MCV 92.8 80.0 - 100.0 fL   MCH 30.3 26.0 - 34.0 pg   MCHC 32.6 30.0 - 36.0 g/dL   RDW 09.8 11.9 - 14.7 %   Platelets 204 150 - 400 K/uL   nRBC 0.0 0.0 - 0.2 %    Comment: Performed at [facility], [address], [place], [place] [address]  Comprehensive metabolic panel     Status: Abnormal   Collection Time: 11/20/22  8:29 AM  Result Value Ref Range   Sodium 141 135 - 145 mmol/L   Potassium 3.0 (L) 3.5 - 5.1 mmol/L   Chloride 101 98 - 111 mmol/L   CO2 27 22 - 32 mmol/L   Glucose, Bld 133 (H) 70 - 99 mg/dL    Comment: Glucose reference range applies only to samples taken after fasting for at least 8 hours.   BUN 11 8 - 23 mg/dL   Creatinine, Ser 2.13 0.61 - 1.24 mg/dL   Calcium  8.9 8.9 - 08.6 mg/dL   Total Protein 6.6 6.5 - 8.1 g/dL   Albumin 3.4 (L) 3.5 - 5.0 g/dL   AST 22 15 - 41 U/L   ALT 18 0 - 44 U/L   Alkaline Phosphatase 65 38 - 126 U/L   Total Bilirubin 0.6 0.3 - 1.2 mg/dL   GFR, Estimated >57 >84 mL/min    Comment: (NOTE) Calculated using the CKD-EPI Creatinine Equation (2021)    Anion gap 13 5 - 15    Comment: Performed at [facility], [address], [place], [place] [address]  Protime-INR     Status: None   Collection Time: 11/20/22  8:29 AM  Result Value Ref Range   Prothrombin Time 14.4 11.4 - 15.2 seconds   INR 1.1 0.8 - 1.2    Comment: (NOTE) INR goal varies based on device and disease states. Performed at [facility], [address], [place], [place] [address]   Type and screen [facility]     Status: None   Collection Time: 11/20/22 11:00 AM  Result Value Ref Range   ABO/RH(D) A POS    Antibody Screen NEG    Sample Expiration      11/23/2022,2359 Performed at [facility], [address] [place], [place] [address]   POC occult blood, ED     Status: Abnormal   Collection Time: 11/20/22 11:07 AM  Result Value Ref Range   Fecal Occult Bld POSITIVE (A) NEGATIVE   CT Chest W Contrast  Result Date: 11/20/2022 CLINICAL DATA:  Abnormal chest x-ray.  Lung nodule.  GI bleed. EXAM: CT CHEST WITH CONTRAST TECHNIQUE: Multidetector CT imaging of the chest was performed during intravenous contrast administration. RADIATION DOSE REDUCTION: This exam was performed according to the departmental dose-optimization program which includes automated exposure control, adjustment of the mA and/or kV according to patient size and/or use of iterative reconstruction technique. CONTRAST:  75mL OMNIPAQUE IOHEXOL 350 MG/ML SOLN COMPARISON:  Chest radiographs 08/15/2020.  Chest CT 07/08/2020. FINDINGS: Cardiovascular: No acute vascular findings. Atherosclerosis of the aorta, great vessels and coronary arteries. The heart size  is normal. There is no pericardial effusion. Mediastinum/Nodes: There are no enlarged mediastinal, hilar or axillary lymph nodes.Small mediastinal lymph nodes appear unchanged. The thyroid gland, trachea and esophagus demonstrate no significant findings. Lungs/Pleura: No pleural effusion or pneumothorax. 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 Previously demonstrated part solid nodule in the superior segment of the left lower  lobe has significantly enlarged in the interval and developed irregular solid components. This measures 2.1 x 2.1 cm on image 65/12 and is highly concerning for adenocarcinoma. 3 mm left lower lobe nodule on image 132/12 is unchanged. 3 mm right lower lobe nodule on image 137/12 is unchanged. No other enlarging or new pulmonary nodules identified. Upper abdomen:  Abdominal findings dictated separately. Musculoskeletal/Chest wall: There is no chest wall mass or suspicious osseous finding. Mild spondylosis. Unless specific follow-up recommendations are mentioned in the findings or impression sections, no imaging follow-up of any mentioned incidental findings is recommended. IMPRESSION: 1. Significant enlargement of previously demonstrated part solid nodule in the superior segment of the left lower lobe with irregular solid components, highly concerning for adenocarcinoma based on morphology and evolution. Atypical metastasis possible given the abdominal findings. See separate abdominopelvic CTA report. 2. No other evidence of thoracic metastatic disease. 3. Abdominal findings dictated separately. 4. Aortic Atherosclerosis (ICD10-I70.0) and Emphysema (ICD10-J43.9). Electronically Signed   By: Carey Bullocks M.D.   On: 11/20/2022 11:41   CT ANGIO GI BLEED  Result Date: 11/20/2022 CLINICAL DATA:  RECTAL BLEEDING. EXAM: CTA ABDOMEN AND PELVIS WITHOUT AND WITH CONTRAST TECHNIQUE: Multidetector CT imaging of the abdomen and pelvis was performed using the standard  protocol during bolus administration of intravenous contrast. Multiplanar reconstructed images and MIPs were obtained and reviewed to evaluate the vascular anatomy. RADIATION DOSE REDUCTION: This exam was performed according to the departmental dose-optimization program which includes automated exposure control, adjustment of the mA and/or kV according to patient size and/or use of iterative reconstruction technique. CONTRAST:  75mL OMNIPAQUE IOHEXOL 350 MG/ML SOLN COMPARISON:  07/07/2020 FINDINGS: VASCULAR Aorta: Normal caliber aorta without aneurysm, dissection, vasculitis or significant stenosis. Moderate atherosclerosis. Celiac: Patent without evidence of aneurysm, dissection, vasculitis or significant stenosis. SMA: Patent without evidence of aneurysm, dissection, vasculitis or significant stenosis. Renals: Both renal arteries are patent without evidence of aneurysm, dissection, vasculitis, fibromuscular dysplasia or significant stenosis. IMA: Patent without evidence of aneurysm, dissection, vasculitis or significant stenosis. Inflow: Patent without evidence of aneurysm, dissection, vasculitis or significant stenosis. Proximal Outflow: Bilateral common femoral and visualized portions of the superficial and profunda femoral arteries are patent without evidence of aneurysm, dissection, vasculitis or significant stenosis. Veins: No obvious venous abnormality within the limitations of this arterial phase study. Review of the MIP images confirms the above findings. NON-VASCULAR Lower chest: See port for chest CT performed at the same time. Hepatobiliary: 6.8 x 5.3 cm lesion in the anterior right liver shows peripheral irregular enhancement and is new since the prior study. Left hepatic lobe cyst is stable. There is no evidence for gallstones, gallbladder wall thickening, or pericholecystic fluid. No intrahepatic or extrahepatic biliary dilation. Pancreas: No focal mass lesion. No dilatation of the main duct. No  intraparenchymal cyst. No peripancreatic edema. Spleen: No splenomegaly. No suspicious focal mass lesion. Adrenals/Urinary Tract: No adrenal nodule or mass. Kidneys unremarkable. No evidence for hydroureter. Calcification is seen along the mucosal surface of the posterior left bladder wall and may reflect dependent stones or mucosal calcification. Stomach/Bowel: Stomach is unremarkable. No gastric wall thickening. No evidence of outlet obstruction. No evidence for contrast extravasation into the stomach. Duodenum is normally positioned as is the ligament of Treitz. No small bowel wall thickening. No small bowel dilatation. No evidence for contrast extravasation in the small bowel loops. No evidence for active extravasation in the colon. 2 foci of hyperintensity are seen on arterial phase imaging, 1 in the right colon (98/7)  and 1 in the transverse colon (83/7) but both of these were present on precontrast imaging consistent with material in the fecal stream. There is irregular wall thickening in the rectum with heterogeneous enhancement in the rectum. Lymphatic: There is no gastrohepatic or hepatoduodenal ligament lymphadenopathy. No retroperitoneal or mesenteric lymphadenopathy. No pelvic sidewall lymphadenopathy. Reproductive: Prostate gland is enlarged. Other: No intraperitoneal free fluid. Musculoskeletal: No worrisome lytic or sclerotic osseous abnormality. IMPRESSION: 1. No evidence for active extravasation in the bowel. 2. Irregular wall thickening in the rectum with heterogeneous enhancement. Imaging features may be related to infectious/inflammatory etiology or neoplasm. 3. 6.8 x 5.3 cm lesion in the anterior right liver shows peripheral irregular enhancement and is new since the prior study. Imaging features are highly suspicious for neoplasm and metastatic disease and primary hepatic cancer would both be considerations. MRI may provide some additional characterization, but tissue sampling likely  warranted. 4. Calcification along the mucosal surface of the posterior left bladder wall may reflect dependent stones or mucosal calcification. Cystoscopy may be warranted to exclude a mucosal lesion in this area. 5. Prostatomegaly. 6. Moderate stool volume suggest clinical constipation. 7.  Aortic Atherosclerosis (ICD10-I70.0). Electronically Signed   By: Kennith Center M.D.   On: 11/20/2022 11:41    Pending Labs Unresulted Labs (From admission, onward)     Start     Ordered   11/21/22 0500  Comprehensive metabolic panel  Tomorrow morning,   R        11/20/22 1226   11/21/22 0500  CBC  Tomorrow morning,   R        11/20/22 1226   11/20/22 1225  Magnesium  Add-on,   AD        11/20/22 1226            Vitals/Pain Today's Vitals   11/20/22 0900 11/20/22 0930 11/20/22 1057 11/20/22 1100  BP: (!) 148/81 (!) 153/78  (!) 160/77  Pulse: 65 64  (!) 58  Resp:    18  Temp:   98.3 F (36.8 C) 98.3 F (36.8 C)  TempSrc:   Oral Oral  SpO2: 98% 100%  98%  Weight:      Height:      PainSc:        Isolation Precautions No active isolations  Medications Medications  latanoprost (XALATAN) 0.005 % ophthalmic solution 1 drop (has no administration in time range)  sodium chloride flush (NS) 0.9 % injection 3 mL (has no administration in time range)  acetaminophen (TYLENOL) tablet 650 mg (has no administration in time range)    Or  acetaminophen (TYLENOL) suppository 650 mg (has no administration in time range)  polyethylene glycol (MIRALAX / GLYCOLAX) packet 17 g (has no administration in time range)  0.9 %  sodium chloride infusion ( Intravenous New Bag/Given 11/20/22 0900)  pantoprazole (PROTONIX) injection 40 mg (40 mg Intravenous Given 11/20/22 1103)  iohexol (OMNIPAQUE) 350 MG/ML injection 75 mL (75 mLs Intravenous Contrast Given 11/20/22 1052)    Mobility walks     Focused Assessments     R Recommendations: See Admitting Provider Note  Report given to:   Additional Notes:

## 2022-11-20 NOTE — ED Notes (Signed)
Patient transported to CT 

## 2022-11-20 NOTE — Progress Notes (Signed)
Patient transferred from ED at 1330 PM via bed. Alert and oriented. On RA. Family is at bed side. Rectal bleeding was noted upon skin assessment. Cardiac monitoring is being started.  Will continue to monitor

## 2022-11-20 NOTE — ED Provider Notes (Signed)
[place] [facility]MENT AT [facility] Provider Note   CSN: [id number] Arrival date & time: 11/20/22  1027     History  Chief Complaint  Patient presents with   Rectal Bleeding    Ryan Mosley is a 82 y.o. male.   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  Patient is not a particularly talented historian.  Seems that he had removal of distal rectal mass 09/25/2020.  Indicates that he has some lower abdominal pain that is achy and not severe.  Denies any fevers or chills at home.  He has had multiple episodes of bloody diarrhea he seems to have trouble telling me whether it is black or bright red blood or maroon.  Seems that he was referred to cancer center, there are reports that he has declined further imaging to evaluate lung nodules however patient seems to be under the impression that he was going to receive further imaging and that it has not happened.     Home Medications Prior to Admission medications   Medication Sig Start Date End Date Taking? Authorizing Provider  acetaminophen (TYLENOL) 325 MG tablet Take 650 mg by mouth daily.    [provider]  amLODipine (NORVASC) 5 MG tablet TAKE 1 TABLET BY MOUTH EVERY DAY FOR BLOOD PRESSURE 06/28/22   Donita Brooks, MD  latanoprost (XALATAN) 0.005 % ophthalmic solution Place 1 drop into both eyes at bedtime. 06/13/17   [provider]  lisinopril (ZESTRIL) 40 MG tablet TAKE 1 TABLET BY MOUTH EVERY DAY 08/30/22   Donita Brooks, MD  rosuvastatin (CRESTOR) 5 MG tablet TAKE 1 TABLET BY MOUTH ONCE A WEEK FOR CHOLESTEROL 05/17/22   Donita Brooks, MD      Allergies    Metformin     Review of Systems   Review of Systems  Gastrointestinal:  Positive for hematochezia.    Physical Exam Updated Vital Signs BP (!) 160/77 (BP Location: Right Arm)   Pulse (!) 58   Temp 98.3 F (36.8 C) (Oral)   Resp 18   Ht 5\' 8"  (1.727 m)   Wt 75.8 kg   SpO2 98%   BMI 25.39 kg/m  Physical Exam Vitals and nursing note reviewed.  Constitutional:      General: He is not in acute distress. HENT:     Head: Normocephalic and atraumatic.     Nose: Nose normal.  Eyes:     General: No scleral icterus. Cardiovascular:     Rate and Rhythm: Normal rate and regular rhythm.     Pulses: Normal pulses.     Heart sounds: Normal heart sounds.  Pulmonary:     Effort: Pulmonary effort is normal. No respiratory distress.     Breath sounds: No wheezing.  Abdominal:     Palpations: Abdomen is soft.     Tenderness: There is abdominal tenderness.     Comments: Diffuse lower abdominal tenderness.  No guarding or rebound  Genitourinary:    Comments: Rectal exam chaperoned by Florentina Addison RN  BRB present. Musculoskeletal:     Cervical back: Normal range of motion.     Right lower leg: No edema.  Left lower leg: No edema.  Skin:    General: Skin is warm and dry.     Capillary Refill: Capillary refill takes less than 2 seconds.  Neurological:     Mental Status: He is alert. Mental status is at baseline.  Psychiatric:        Mood and Affect: Mood normal.        Behavior: Behavior normal.     ED Results / Procedures / Treatments   Labs (all labs ordered are listed, but only abnormal results are displayed) Labs Reviewed  CBC - Abnormal; Notable for the following components:      Result Value   RBC 3.90 (*)    Hemoglobin 11.8 (*)    HCT 36.2 (*)    All other components within normal limits  COMPREHENSIVE METABOLIC PANEL - Abnormal; Notable for the following components:   Potassium 3.0 (*)    Glucose, Bld 133 (*)    Albumin 3.4 (*)    All other components within normal limits  POC  OCCULT BLOOD, ED - Abnormal; Notable for the following components:   Fecal Occult Bld POSITIVE (*)    All other components within normal limits  PROTIME-INR  TYPE AND SCREEN    EKG None  Radiology No results found.  Procedures .Critical Care  Performed by: Gailen Shelter, PA Authorized by: Gailen Shelter, PA   Critical care provider statement:    Critical care time (minutes):  35   Critical care time was exclusive of:  Separately billable procedures and treating other patients and teaching time   Critical care was necessary to treat or prevent imminent or life-threatening deterioration of the following conditions: rapid decline in hgb w GIB (actively bleeding)   Critical care was time spent personally by me on the following activities:  Development of treatment plan with patient or surrogate, review of old charts, re-evaluation of patient's condition, pulse oximetry, ordering and review of radiographic studies, ordering and review of laboratory studies, ordering and performing treatments and interventions, obtaining history from patient or surrogate, examination of patient and evaluation of patient's response to treatment   Care discussed with: admitting provider       Medications Ordered in ED Medications  0.9 %  sodium chloride infusion ( Intravenous New Bag/Given 11/20/22 0900)  pantoprazole (PROTONIX) injection 40 mg (40 mg Intravenous Given 11/20/22 1103)  iohexol (OMNIPAQUE) 350 MG/ML injection 75 mL (75 mLs Intravenous Contrast Given 11/20/22 1052)    ED Course/ Medical Decision Making/ A&P Clinical Course as of 11/20/22 1238  Sat Nov 20, 2022  0809 Yesteday evening sometimes red/sometimes black.  Hx of CA removed 1-2 years ago.   Feels hungry and tired. More fatigued. NO abd pain. No on anticoag.  [WF]  1158 IMPRESSION: 1. No evidence for active extravasation in the bowel. 2. Irregular wall thickening in the rectum with heterogeneous enhancement. Imaging features  may be related to infectious/inflammatory etiology or neoplasm. 3. 6.8 x 5.3 cm lesion in the anterior right liver shows peripheral irregular enhancement and is new since the prior study. Imaging features are highly suspicious for neoplasm and metastatic disease and primary hepatic cancer would both be considerations. MRI may provide some additional characterization, but tissue sampling likely warranted. 4. Calcification along the mucosal surface of the posterior left bladder wall may reflect dependent stones or mucosal calcification. Cystoscopy may be warranted to exclude a mucosal lesion in this area. 5. Prostatomegaly. 6. Moderate stool volume suggest clinical constipation. 7.  Aortic  Atherosclerosis (ICD10-I70.0). [WF]  1158 IMPRESSION: 1. Significant enlargement of previously demonstrated part solid nodule in the superior segment of the left lower lobe with irregular solid components, highly concerning for adenocarcinoma based on morphology and evolution. Atypical metastasis possible given the abdominal findings. See separate abdominopelvic CTA report. 2. No other evidence of thoracic metastatic disease. 3. Abdominal findings dictated separately. 4. Aortic Atherosclerosis    [WF]    Clinical Course User Index [WF] Gailen Shelter, PA                                 Medical Decision Making Amount and/or Complexity of Data Reviewed Labs: ordered. Radiology: ordered.  Risk Prescription drug management. Decision regarding hospitalization.   This patient presents to the ED for concern of rectal bleeding, this involves a number of treatment options, and is a complaint that carries with it a moderate to high risk of complications and morbidity. A differential diagnosis was considered for the patient's symptoms which is discussed below:   The differential diagnosis for lower GI bleed includes but is not limited to high flow upper GI bleed, diverticulosis, vascular  ectasia/AVM, inflammatory bowel disease, infectious colitis, mesenteric ischemia or ischemic colitis,  colorectal cancer or polyps, internal hemorrhoids, aortoenteric fistula, rectal foreign body, rectal ulceration or anal fissure.    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  Patient is not a particularly talented historian.  Seems that he had removal of distal rectal mass 09/25/2020.  Indicates that he has some lower abdominal pain that is achy and not severe.  Denies any fevers or chills at home.  He has had multiple episodes of bloody diarrhea he seems to have trouble telling me whether it is black or bright red blood or maroon.  Seems that he was referred to cancer center, there are reports that he has declined further imaging to evaluate lung nodules however patient seems to be under the impression that he was going to receive further imaging and that it has not happened.    EMR reviewed including pt PMHx, past surgical history and past visits to ER.   See HPI for more details   Lab Tests:   I ordered and independently interpreted labs. Labs notable for Patient's hemoglobin has significantly dropped from 15.5-11.8.  His symptoms of rectal bleeding began only in the last 1 to 2 days which indicates that this is quite significant and likely rapid drop.  CMP with mild hypokalemia 3.0 we will hold off on repletion in case patient requires blood transfusion.  INR normal, Hemoccult positive with clearly evident BRB  Imaging Studies:  Abnormal findings. I personally reviewed all imaging studies. Imaging notable for  IMPRESSION: 1. Significant  enlargement of previously demonstrated part solid nodule in the superior segment of the left lower lobe with irregular solid components, highly concerning for adenocarcinoma based on morphology and evolution. Atypical metastasis possible given the abdominal findings. See separate abdominopelvic CTA report. 2. No other evidence of thoracic metastatic disease. 3. Abdominal findings dictated separately. 4. Aortic Atherosclerosis   IMPRESSION:  1. No  evidence for active extravasation in the bowel.  2. Irregular wall thickening in the rectum with heterogeneous  enhancement. Imaging features may be related to  infectious/inflammatory etiology or neoplasm.  3. 6.8 x 5.3 cm lesion in the anterior right liver shows peripheral  irregular enhancement and is new since the prior study. Imaging  features are highly suspicious for neoplasm and metastatic disease  and primary hepatic cancer would both be considerations. MRI may  provide some additional characterization, but tissue sampling likely  warranted.  4. Calcification along the mucosal surface of the posterior left  bladder wall may reflect dependent stones or mucosal calcification.  Cystoscopy may be warranted to exclude a mucosal lesion in this  area.  5. Prostatomegaly.  6. Moderate stool volume suggest clinical constipation.  7.  Aortic Atherosclerosis (ICD10-I70.0).       Cardiac Monitoring:  The patient was maintained on a cardiac monitor.  I personally viewed and interpreted the cardiac monitored which showed an underlying rhythm of: NSR NA   Medicines ordered:  I ordered medication including Protonix  Reevaluation of the patient after these medicines showed that the patient stayed the same I have reviewed the patients home medicines and have made adjustments as needed   Critical Interventions:     Consults/Attending Physician   I requested consultation with GI -- [facility] covering [facility] today,  and discussed lab  and imaging findings as well as pertinent plan - they recommend: will see pt  I discussed this case with my attending physician who cosigned this note including patient's presenting symptoms, physical exam, and planned diagnostics and interventions. Attending physician stated agreement with plan or made changes to plan which were implemented.   Attending physician assessed patient at bedside.   Admitted to Dr. Alinda Money.  Reevaluation:  After the interventions noted above I re-evaluated patient and found that they have :stayed the same   Social Determinants of Health:  The patient's social determinants of health were a factor in the care of this patient    Problem List / ED Course:  GI bleed in setting of history of rectal cancer.  Will require admission for nearly 4 g drop in hemoglobin.  New and concerning evidence for cancer on CT imaging.   Dispostion:  After consideration of the diagnostic results and the patients response to treatment, I feel that the patent would benefit from admission for GI bleed   Final Clinical Impression(s) / ED Diagnoses Final diagnoses:  Rectal bleeding  Rectal mass  Lung mass    Rx / DC Orders ED Discharge Orders     None         Gailen Shelter, [place] 11/20/22 1239    Jacalyn Lefevre, MD 11/21/22 1550

## 2022-11-21 ENCOUNTER — Observation Stay (HOSPITAL_COMMUNITY): Payer: Medicare Other | Admitting: Anesthesiology

## 2022-11-21 ENCOUNTER — Observation Stay (HOSPITAL_BASED_OUTPATIENT_CLINIC_OR_DEPARTMENT_OTHER): Payer: Medicare Other | Admitting: Anesthesiology

## 2022-11-21 ENCOUNTER — Encounter (HOSPITAL_COMMUNITY)
Admission: EM | Disposition: A | Discharge status: Home / Self Care | Payer: Self-pay | Source: Home / Self Care | Attending: Emergency Medicine

## 2022-11-21 DIAGNOSIS — C20 Malignant neoplasm of rectum: Secondary | ICD-10-CM | POA: Diagnosis not present

## 2022-11-21 DIAGNOSIS — I10 Essential (primary) hypertension: Secondary | ICD-10-CM

## 2022-11-21 DIAGNOSIS — R933 Abnormal findings on diagnostic imaging of other parts of digestive tract: Secondary | ICD-10-CM | POA: Diagnosis not present

## 2022-11-21 DIAGNOSIS — E119 Type 2 diabetes mellitus without complications: Secondary | ICD-10-CM

## 2022-11-21 DIAGNOSIS — Z87891 Personal history of nicotine dependence: Secondary | ICD-10-CM | POA: Diagnosis not present

## 2022-11-21 DIAGNOSIS — K625 Hemorrhage of anus and rectum: Secondary | ICD-10-CM | POA: Diagnosis not present

## 2022-11-21 DIAGNOSIS — K922 Gastrointestinal hemorrhage, unspecified: Secondary | ICD-10-CM | POA: Diagnosis not present

## 2022-11-21 HISTORY — PX: FLEXIBLE SIGMOIDOSCOPY: SHX5431

## 2022-11-21 HISTORY — PX: HEMOSTASIS CONTROL: SHX6838

## 2022-11-21 LAB — CBC
HCT: 34 % — ABNORMAL LOW (ref 39.0–52.0)
Hemoglobin: 11.4 g/dL — ABNORMAL LOW (ref 13.0–17.0)
MCH: 31.2 pg (ref 26.0–34.0)
MCHC: 33.5 g/dL (ref 30.0–36.0)
MCV: 93.2 fL (ref 80.0–100.0)
Platelets: 196 10*3/uL (ref 150–400)
RBC: 3.65 MIL/uL — ABNORMAL LOW (ref 4.22–5.81)
RDW: 12.1 % (ref 11.5–15.5)
WBC: 8.7 10*3/uL (ref 4.0–10.5)
nRBC: 0 % (ref 0.0–0.2)

## 2022-11-21 LAB — COMPREHENSIVE METABOLIC PANEL
ALT: 20 U/L (ref 0–44)
AST: 28 U/L (ref 15–41)
Albumin: 3.5 g/dL (ref 3.5–5.0)
Alkaline Phosphatase: 65 U/L (ref 38–126)
Anion gap: 9 (ref 5–15)
BUN: 8 mg/dL (ref 8–23)
CO2: 26 mmol/L (ref 22–32)
Calcium: 8.7 mg/dL — ABNORMAL LOW (ref 8.9–10.3)
Chloride: 105 mmol/L (ref 98–111)
Creatinine, Ser: 0.81 mg/dL (ref 0.61–1.24)
GFR, Estimated: >60 mL/min (ref >60)
Glucose, Bld: 90 mg/dL (ref 70–99)
Potassium: 2.9 mmol/L — ABNORMAL LOW (ref 3.5–5.1)
Sodium: 140 mmol/L (ref 135–145)
Total Bilirubin: 1.1 mg/dL (ref 0.3–1.2)
Total Protein: 6.5 g/dL (ref 6.5–8.1)

## 2022-11-21 LAB — GLUCOSE, CAPILLARY
Glucose-Capillary: 124 mg/dL — ABNORMAL HIGH (ref 70–99)
Glucose-Capillary: 145 mg/dL — ABNORMAL HIGH (ref 70–99)
Glucose-Capillary: 131 mg/dL — ABNORMAL HIGH (ref 70–99)

## 2022-11-21 LAB — MAGNESIUM: Magnesium: 2.1 mg/dL (ref 1.7–2.4)

## 2022-11-21 LAB — POTASSIUM: Potassium: 3.1 mmol/L — ABNORMAL LOW (ref 3.5–5.1)

## 2022-11-21 SURGERY — SIGMOIDOSCOPY, FLEXIBLE
Anesthesia: Monitor Anesthesia Care

## 2022-11-21 MED ORDER — MEPERIDINE HCL 25 MG/ML IJ SOLN: 6.2500 mg | INTRAMUSCULAR | Status: DC | PRN

## 2022-11-21 MED ORDER — PROPOFOL 500 MG/50ML IV EMUL
INTRAVENOUS | Status: DC | PRN
  Administered 2022-11-21: 100 ug/kg/min via INTRAVENOUS

## 2022-11-21 MED ORDER — OXYCODONE HCL 5 MG PO TABS: 5.0000 mg | ORAL_TABLET | Freq: Once | ORAL | Status: DC | PRN

## 2022-11-21 MED ORDER — POTASSIUM CHLORIDE CRYS ER 20 MEQ PO TBCR
40.0000 meq | EXTENDED_RELEASE_TABLET | ORAL | Status: AC
  Administered 2022-11-21 (×2): 40 meq via ORAL
  Filled 2022-11-21 (×2): qty 2

## 2022-11-21 MED ORDER — PROMETHAZINE HCL 25 MG/ML IJ SOLN: 6.2500 mg | INTRAMUSCULAR | Status: DC | PRN

## 2022-11-21 MED ORDER — LACTATED RINGERS IV SOLN: INTRAVENOUS | Status: DC | PRN

## 2022-11-21 MED ORDER — MIDAZOLAM HCL 2 MG/2ML IJ SOLN: 0.5000 mg | Freq: Once | INTRAMUSCULAR | Status: DC | PRN

## 2022-11-21 MED ORDER — OXYCODONE HCL 5 MG/5ML PO SOLN: 5.0000 mg | Freq: Once | ORAL | Status: DC | PRN

## 2022-11-21 MED ORDER — POTASSIUM CHLORIDE CRYS ER 20 MEQ PO TBCR: 40.0000 meq | EXTENDED_RELEASE_TABLET | Freq: Every day | ORAL | 0 refills | Status: DC

## 2022-11-21 MED ORDER — FENTANYL CITRATE (PF) 100 MCG/2ML IJ SOLN: 25.0000 ug | INTRAMUSCULAR | Status: DC | PRN

## 2022-11-21 NOTE — Op Note (Signed)
[facility] Patient Name: Ryan Mosley Procedure Date : 11/21/2022 MRN: [id number] Attending MD: Jeani Hawking , MD, [id number] Date of Birth: [birth year] CSN: [id number] Age: 82 Admit Type: Inpatient Procedure:                Flexible Sigmoidoscopy Indications:              Hematochezia Providers:                Jeani Hawking, MD, Eliberto Ivory, RN, Salley Scarlet,                            Technician, Gwenyth Allegra CRNA, CRNA Referring MD:              Medicines:                Propofol per Anesthesia Complications:            No immediate complications. Estimated Blood Loss:     Estimated blood loss: none. Procedure:                Pre-Anesthesia Assessment:                           - Prior to the procedure, a History and Physical                            was performed, and patient medications and                            allergies were reviewed. The patient's tolerance of                            previous anesthesia was also reviewed. The risks                            and benefits of the procedure and the sedation                            options and risks were discussed with the patient.                            All questions were answered, and informed consent                            was obtained. Prior Anticoagulants: The patient has                            taken no anticoagulant or antiplatelet agents. ASA                            Grade Assessment: III - A patient with severe                            systemic disease. After reviewing the risks and  benefits, the patient was deemed in satisfactory                            condition to undergo the procedure.                           - Sedation was administered by an anesthesia                            professional. Deep sedation was attained.                           After obtaining informed consent, the scope was                            passed under direct  vision. The [id number]-[id number] ([id number])                            Olympus endoscope was introduced through the anus                            and advanced to the the sigmoid colon. The flexible                            sigmoidoscopy was accomplished without difficulty.                            The patient tolerated the procedure well. The                            quality of the bowel preparation was good. Scope In: 11:43:49 AM Scope Out: 11:49:58 AM Total Procedure Duration: 0 hours 6 minutes 9 seconds  Findings:      An infiltrative, sessile and ulcerated non-obstructing large mass was       found in the rectum. The mass was partially circumferential (involving       two-thirds of the lumen circumference). The mass measured two cm in       length. Oozing was present. Area was successfully injected with 3 mL       PuraStat. for hemostasis.      The digital rectal exam revealed a 2 cm (diameter) firm and hard rectal       mass palpated 0 to 1 cm from the anal verge.      The rectal examination was positive for two discrete malignant lesions.       There was a larger left lateral mass extending 2 cm proximally from the       anal verge. In the right lateral portion of the rectum another smaller       mass was palpated. Combinated together both masses encompassed       approximately 70% of the circumfirence. These lesions were       nonobstructing. The larger left lateral mass exhibited a central       ulceration with a possible vessel versus adherent clot. Friability was       noted. PuraStat was applied to both lesions as a temporizing measure. 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                           - Rectal mass 0 to 1 cm from the anal verge.                           - No specimens collected. Recommendation:           - Return patient to hospital ward for ongoing care.                           - Further treatment will be up to the patient's                             wishes.                           - Signing off. Procedure Code(s):        --- Professional ---                           [id number], Sigmoidoscopy, flexible; with control of                            bleeding, any method Diagnosis Code(s):        --- Professional ---                           C20, Malignant neoplasm of rectum                           K62.89, Other specified diseases of anus and rectum                           K92.1, Melena (includes Hematochezia) CPT copyright 2022 [employer]. All rights reserved. The codes documented in this report are preliminary and upon coder review may  be revised to meet current compliance requirements. Jeani Hawking, MD Jeani Hawking, MD 11/21/2022 12:36:11 PM This report has been signed electronically. Number of Addenda: 0

## 2022-11-21 NOTE — Care Management Obs Status (Signed)
MEDICARE OBSERVATION STATUS NOTIFICATION   Patient Details  Name: Ryan Mosley MRN: [id number] Date of Birth: [birth year]   Medicare Observation Status Notification Given:  Yes    Harmony Sandell G., RN 11/21/2022, 2:29 PM

## 2022-11-21 NOTE — Transfer of Care (Signed)
Immediate Anesthesia Transfer of Care Note  Patient: Ryan Mosley  Procedure(s) Performed: FLEXIBLE SIGMOIDOSCOPY HEMOSTASIS CONTROL  Patient Location: PACU  Anesthesia Type:MAC  Level of Consciousness: awake, alert , and oriented  Airway & Oxygen Therapy: Patient Spontanous Breathing  Post-op Assessment: Report given to RN and Post -op Vital signs reviewed and stable  Post vital signs: Reviewed and stable  Last Vitals:  Vitals Value Taken Time  BP 144/112   Temp    Pulse 78   Resp 14   SpO2 98%     Last Pain:  Vitals:   11/21/22 1115  TempSrc: Temporal  PainSc: 0-No pain         Complications: No notable events documented.

## 2022-11-21 NOTE — Anesthesia Preprocedure Evaluation (Addendum)
Anesthesia Evaluation  Patient identified by MRN, date of birth, ID band Patient awake    Reviewed: Allergy & Precautions, NPO status , Patient's Chart, lab work & pertinent test results  History of Anesthesia Complications Negative for: history of anesthetic complications  Airway Mallampati: I  TM Distance: >3 FB Neck ROM: Full    Dental  (+) Edentulous Upper, Edentulous Lower   Pulmonary former smoker   breath sounds clear to auscultation       Cardiovascular hypertension, Pt. on medications (-) angina  Rhythm:Regular Rate:Normal     Neuro/Psych glaucoma    GI/Hepatic Neg liver ROS,neg GERD  ,,GI bleed: Rectal cancer   Endo/Other  diabetes (diet controlled, glu 124)    Renal/GU negative Renal ROS     Musculoskeletal   Abdominal   Peds  Hematology  (+) Blood dyscrasia (Hb 11.3), anemia   Anesthesia Other Findings   Reproductive/Obstetrics                             Anesthesia Physical Anesthesia Plan  ASA: 3  Anesthesia Plan: MAC   Post-op Pain Management: Minimal or no pain anticipated   Induction:   PONV Risk Score and Plan: 1 and Treatment may vary due to age or medical condition  Airway Management Planned: Natural Airway and Simple Face Mask  Additional Equipment: None  Intra-op Plan:   Post-operative Plan:   Informed Consent: I have reviewed the patients History and Physical, chart, labs and discussed the procedure including the risks, benefits and alternatives for the proposed anesthesia with the patient or authorized representative who has indicated his/her understanding and acceptance.       Plan Discussed with: CRNA and Surgeon  Anesthesia Plan Comments:         Anesthesia Quick Evaluation

## 2022-11-21 NOTE — Discharge Summary (Signed)
Physician Discharge Summary   Patient: Ryan Mosley MRN: [id number] DOB: 1940/03/29  Admit date:     11/20/2022  Discharge date: 11/21/22  Discharge Physician: Jacquelin Hawking, MD   PCP: Donita Brooks, MD   Recommendations at discharge:  PCP visit for hospital follow-up Palliative care referral Repeat BMP in 3-5 days  Discharge Diagnoses: Principal Problem:   GI bleed Active Problems:   Hypertension   Diabetes mellitus without complication (HCC)   Hyperlipemia   Primary open angle glaucoma (POAG) of both eyes   BPH (benign prostatic hyperplasia)   Rectal cancer (HCC)  Resolved Problems:   * No resolved hospital problems. *  Hospital Course: Ryan Mosley is a 82 y.o. male with a history of diabetes mellitus type 2, hypertension, hyperlipidemia, rectal cancer, BPH, glaucoma.  Patient presented secondary to rectal bleeding with associated mild anemia.  CTA bleeding study was negative for identified hemorrhage.  Gastroenterology was consulted and performed flexible sigmoidoscopy which was identified oozing from his known rectal cancer. Patient continues to decline medical therapies for his cancer. He has clear understanding and shows capacity to make his own decisions. Family aware of his decisions as well with no objections shared.  Assessment and Plan:  GI bleeding Likely related to her known rectal cancer.  Patient with bright red blood per rectum.  No active bleeding noted on CTA abdominal study.  Fecal occult blood test positive.  Gastroenterology was consulted and performed a flexible sigmoidoscopy on 9/29 with evidence of oozing from known rectal mass; injected with PuraStat for hemostasis.  Acute anemia Secondary to GI bleeding.  Hemoglobin stable.  Rectal adenocarcinoma Diagnosed in 2022.  Patient was gastroenterology, general surgery, medical oncology with consideration for radiation oncology at that time.  Patient declined therapy.  Patient continues to decline  consideration for treatment of his cancer.  Will place a referral to palliative care on discharge.  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  Patient continues to decline workup/management.  Hypokalemia Potassium is low was 2.9 this admission.  Patient treated with potassium. Continue potassium supplementation on discharge. Recommend repeat BMP as an outpatient in 3-5 days.  Diabetes mellitus type 2 Appears to be controlled, although last hemoglobin A1c was from July 2023 at 6.5%.  Hyperlipidemia Continue Crestor.  Primary hypertension Continue amlodipine and lisinopril.  Glaucoma Continue latanoprost.   Consultants: Gastroenterology Procedures performed: Flexible sigmoidoscopy Disposition: Home Diet recommendation: Carb modified diet   DISCHARGE MEDICATION: Allergies as of 11/21/2022       Reactions   Glucophage [metformin] Diarrhea        Medication List     TAKE these medications    acetaminophen 500 MG tablet Commonly known as: TYLENOL Take 500 mg by mouth 2 (two) times daily as needed for moderate pain, headache or fever.   amLODipine 5 MG tablet Commonly known as: NORVASC TAKE 1 TABLET BY MOUTH EVERY DAY FOR BLOOD PRESSURE   latanoprost 0.005 % ophthalmic solution Commonly known as: XALATAN Place 1 drop into both eyes at bedtime.   lisinopril 40 MG tablet Commonly known as: ZESTRIL TAKE 1 TABLET BY MOUTH EVERY DAY   rosuvastatin 5 MG tablet Commonly known as: CRESTOR TAKE 1 TABLET BY MOUTH ONCE A WEEK FOR CHOLESTEROL What changed:  how much to take how to take this when to take this additional instructions        Discharge Exam: BP (!) 143/80   Pulse 65   Temp 97.7 F (36.5 C) (Oral)  Resp 20   Ht 5\' 8"  (1.727 m)   Wt 68.2 kg   SpO2 100%   BMI 22.86 kg/m   General exam: Appears calm and comfortable Respiratory system: Respiratory effort normal. Central nervous system: Alert and oriented.  Psychiatry: Mood & affect  appropriate.   Condition at discharge: stable  The results of significant diagnostics from this hospitalization (including imaging, microbiology, ancillary and laboratory) are listed below for reference.   Imaging Studies: CT Chest W Contrast  Result Date: 11/20/2022 CLINICAL DATA:  Abnormal chest x-ray.  Lung nodule.  GI bleed. EXAM: CT CHEST WITH CONTRAST TECHNIQUE: Multidetector CT imaging of the chest was performed during intravenous contrast administration. RADIATION DOSE REDUCTION: This exam was performed according to the departmental dose-optimization program which includes automated exposure control, adjustment of the mA and/or kV according to patient size and/or use of iterative reconstruction technique. CONTRAST:  75mL OMNIPAQUE IOHEXOL 350 MG/ML SOLN COMPARISON:  Chest radiographs 08/15/2020.  Chest CT 07/08/2020. FINDINGS: Cardiovascular: No acute vascular findings. Atherosclerosis of the aorta, great vessels and coronary arteries. The heart size is normal. There is no pericardial effusion. Mediastinum/Nodes: There are no enlarged mediastinal, hilar or axillary lymph nodes.Small mediastinal lymph nodes appear unchanged. The thyroid gland, trachea and esophagus demonstrate no significant findings. Lungs/Pleura: No pleural effusion or pneumothorax. 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 Previously demonstrated part solid nodule in the superior segment of the left lower lobe has significantly enlarged in the interval and developed irregular solid components. This measures 2.1 x 2.1 cm on image 65/12 and is highly concerning for adenocarcinoma. 3 mm left lower lobe nodule on image 132/12 is unchanged. 3 mm right lower lobe nodule on image 137/12 is unchanged. No other enlarging or new pulmonary nodules identified. Upper abdomen:  Abdominal findings dictated separately. Musculoskeletal/Chest wall: There is no chest wall mass or suspicious osseous finding. Mild  spondylosis. Unless specific follow-up recommendations are mentioned in the findings or impression sections, no imaging follow-up of any mentioned incidental findings is recommended. IMPRESSION: 1. Significant enlargement of previously demonstrated part solid nodule in the superior segment of the left lower lobe with irregular solid components, highly concerning for adenocarcinoma based on morphology and evolution. Atypical metastasis possible given the abdominal findings. See separate abdominopelvic CTA report. 2. No other evidence of thoracic metastatic disease. 3. Abdominal findings dictated separately. 4. Aortic Atherosclerosis (ICD10-I70.0) and Emphysema (ICD10-J43.9). Electronically Signed   By: Carey Bullocks M.D.   On: 11/20/2022 11:41   CT ANGIO GI BLEED  Result Date: 11/20/2022 CLINICAL DATA:  RECTAL BLEEDING. EXAM: CTA ABDOMEN AND PELVIS WITHOUT AND WITH CONTRAST TECHNIQUE: Multidetector CT imaging of the abdomen and pelvis was performed using the standard protocol during bolus administration of intravenous contrast. Multiplanar reconstructed images and MIPs were obtained and reviewed to evaluate the vascular anatomy. RADIATION DOSE REDUCTION: This exam was performed according to the departmental dose-optimization program which includes automated exposure control, adjustment of the mA and/or kV according to patient size and/or use of iterative reconstruction technique. CONTRAST:  75mL OMNIPAQUE IOHEXOL 350 MG/ML SOLN COMPARISON:  07/07/2020 FINDINGS: VASCULAR Aorta: Normal caliber aorta without aneurysm, dissection, vasculitis or significant stenosis. Moderate atherosclerosis. Celiac: Patent without evidence of aneurysm, dissection, vasculitis or significant stenosis. SMA: Patent without evidence of aneurysm, dissection, vasculitis or significant stenosis. Renals: Both renal arteries are patent without evidence of aneurysm, dissection, vasculitis, fibromuscular dysplasia or significant stenosis. IMA:  Patent without evidence of aneurysm, dissection, vasculitis or significant stenosis. Inflow: Patent without evidence  of aneurysm, dissection, vasculitis or significant stenosis. Proximal Outflow: Bilateral common femoral and visualized portions of the superficial and profunda femoral arteries are patent without evidence of aneurysm, dissection, vasculitis or significant stenosis. Veins: No obvious venous abnormality within the limitations of this arterial phase study. Review of the MIP images confirms the above findings. NON-VASCULAR Lower chest: See port for chest CT performed at the same time. Hepatobiliary: 6.8 x 5.3 cm lesion in the anterior right liver shows peripheral irregular enhancement and is new since the prior study. Left hepatic lobe cyst is stable. There is no evidence for gallstones, gallbladder wall thickening, or pericholecystic fluid. No intrahepatic or extrahepatic biliary dilation. Pancreas: No focal mass lesion. No dilatation of the main duct. No intraparenchymal cyst. No peripancreatic edema. Spleen: No splenomegaly. No suspicious focal mass lesion. Adrenals/Urinary Tract: No adrenal nodule or mass. Kidneys unremarkable. No evidence for hydroureter. Calcification is seen along the mucosal surface of the posterior left bladder wall and may reflect dependent stones or mucosal calcification. Stomach/Bowel: Stomach is unremarkable. No gastric wall thickening. No evidence of outlet obstruction. No evidence for contrast extravasation into the stomach. Duodenum is normally positioned as is the ligament of Treitz. No small bowel wall thickening. No small bowel dilatation. No evidence for contrast extravasation in the small bowel loops. No evidence for active extravasation in the colon. 2 foci of hyperintensity are seen on arterial phase imaging, 1 in the right colon (98/7) and 1 in the transverse colon (83/7) but both of these were present on precontrast imaging consistent with material in the fecal  stream. There is irregular wall thickening in the rectum with heterogeneous enhancement in the rectum. Lymphatic: There is no gastrohepatic or hepatoduodenal ligament lymphadenopathy. No retroperitoneal or mesenteric lymphadenopathy. No pelvic sidewall lymphadenopathy. Reproductive: Prostate gland is enlarged. Other: No intraperitoneal free fluid. Musculoskeletal: No worrisome lytic or sclerotic osseous abnormality. IMPRESSION: 1. No evidence for active extravasation in the bowel. 2. Irregular wall thickening in the rectum with heterogeneous enhancement. Imaging features may be related to infectious/inflammatory etiology or neoplasm. 3. 6.8 x 5.3 cm lesion in the anterior right liver shows peripheral irregular enhancement and is new since the prior study. Imaging features are highly suspicious for neoplasm and metastatic disease and primary hepatic cancer would both be considerations. MRI may provide some additional characterization, but tissue sampling likely warranted. 4. Calcification along the mucosal surface of the posterior left bladder wall may reflect dependent stones or mucosal calcification. Cystoscopy may be warranted to exclude a mucosal lesion in this area. 5. Prostatomegaly. 6. Moderate stool volume suggest clinical constipation. 7.  Aortic Atherosclerosis (ICD10-I70.0). Electronically Signed   By: Kennith Center M.D.   On: 11/20/2022 11:41    Microbiology: Results for orders placed or performed in visit on 09/23/20  SARS Coronavirus 2 (TAT 6-24 hrs)     Status: None   Collection Time: 09/23/20 12:00 AM  Result Value Ref Range Status   SARS Coronavirus 2 RESULT: NEGATIVE  Final    Comment: RESULT: NEGATIVESARS-CoV-2 INTERPRETATION:A NEGATIVE  test result means that SARS-CoV-2 RNA was not present in the specimen above the limit of detection of this test. This does not preclude a possible SARS-CoV-2 infection and should not be used as the  sole basis for patient management decisions. Negative  results must be combined with clinical observations, patient history, and epidemiological information. Optimum specimen types and timing for peak viral levels during infections caused by SARS-CoV-2  have not been determined. Collection of multiple specimens or types  of specimens may be necessary to detect virus. Improper specimen collection and handling, sequence variability under primers/probes, or organism present below the limit of detection may  lead to false negative results. Positive and negative predictive values of testing are highly dependent on prevalence. False negative test results are more likely when prevalence of disease is high.The expected result is NEGATIVE.Fact S heet for  Healthcare Providers: [employer] Sheet for Patients: https://poole-fre[employer].org/ Reference Range - Negative     Labs: CBC: Recent Labs  Lab 11/20/22 0829 11/20/22 1444 11/20/22 2152 11/21/22 0942  WBC 8.2 9.2 9.7 8.7  HGB 11.8* 11.4* 11.3* 11.4*  HCT 36.2* 35.5* 35.2* 34.0*  MCV 92.8 92.7 92.1 93.2  PLT 204 210 199 196   Basic Metabolic Panel: Recent Labs  Lab 11/20/22 0829 11/21/22 0942  NA 141 140  K 3.0* 2.9*  CL 101 105  CO2 27 26  GLUCOSE 133* 90  BUN 11 8  CREATININE 0.92 0.81  CALCIUM 8.9 8.7*  MG 2.2  --    Liver Function Tests: Recent Labs  Lab 11/20/22 0829 11/21/22 0942  AST 22 28  ALT 18 20  ALKPHOS 65 65  BILITOT 0.6 1.1  PROT 6.6 6.5  ALBUMIN 3.4* 3.5   CBG: Recent Labs  Lab 11/20/22 1655 11/20/22 2013 11/21/22 0725 11/21/22 1303  GLUCAP 124* 77 124* 145*    Discharge time spent: 35 minutes.  Signed: Jacquelin Hawking, MD [facility] 11/21/2022

## 2022-11-21 NOTE — Hospital Course (Addendum)
Ryan Mosley is a 82 y.o. male with a history of diabetes mellitus type 2, hypertension, hyperlipidemia, rectal cancer, BPH, glaucoma.  Patient presented secondary to rectal bleeding with associated mild anemia.  CTA bleeding study was negative for identified hemorrhage.  Gastroenterology was consulted and performed flexible sigmoidoscopy which was identified oozing from his known rectal cancer. Patient continues to decline medical therapies for his cancer. He has clear understanding and shows capacity to make his own decisions. Family aware of his decisions as well with no objections shared.

## 2022-11-21 NOTE — Discharge Instructions (Addendum)
Ryan Mosley,  You are in the hospital because of bleeding from your rectum.  This is likely related to your known rectal cancer which was diagnosed 2 years ago.  During your workup, it was apparent that you have likely cancer in your liver and your lung.  The gastroenterologist performed a flexible sigmoidoscopy to look in your rectum and lower colon and found evidence of oozing,from your known rectal cancer, which was treated.  It appears your previously referred to oncology but declined treatment options.  I have placed a referral to palliative care for outpatient follow-up.

## 2022-11-21 NOTE — Progress Notes (Signed)
Patient was taken for the procedure at 1100AM.

## 2022-11-21 NOTE — Anesthesia Procedure Notes (Signed)
Procedure Name: MAC Date/Time: 11/21/2022 11:32 AM  Performed by: Gwenyth Allegra, CRNAPre-anesthesia Checklist: Patient identified, Emergency Drugs available, Suction available, Patient being monitored and Timeout performed Patient Re-evaluated:Patient Re-evaluated prior to induction Oxygen Delivery Method: Nasal cannula Preoxygenation: Pre-oxygenation with 100% oxygen

## 2022-11-21 NOTE — Interval H&P Note (Signed)
History and Physical Interval Note:  11/21/2022 11:25 AM  Ryan Mosley  has presented today for surgery, with the diagnosis of Rectal cancer.  The various methods of treatment have been discussed with the patient and family. After consideration of risks, benefits and other options for treatment, the patient has consented to  Procedure(s): FLEXIBLE SIGMOIDOSCOPY (N/A) as a surgical intervention.  The patient's history has been reviewed, patient examined, no change in status, stable for surgery.  I have reviewed the patient's chart and labs.  Questions were answered to the patient's satisfaction.     [name]

## 2022-11-21 NOTE — Anesthesia Postprocedure Evaluation (Signed)
Anesthesia Post Note  Patient: Ryan Mosley  Procedure(s) Performed: FLEXIBLE SIGMOIDOSCOPY HEMOSTASIS CONTROL     Patient location during evaluation: PACU Anesthesia Type: MAC Level of consciousness: awake and alert Pain management: pain level controlled Vital Signs Assessment: post-procedure vital signs reviewed and stable Respiratory status: spontaneous breathing, nonlabored ventilation and respiratory function stable Cardiovascular status: blood pressure returned to baseline and stable Postop Assessment: no apparent nausea or vomiting and able to ambulate Anesthetic complications: no   No notable events documented.  Last Vitals:  Vitals:   11/21/22 1200 11/21/22 1215  BP: 130/73   Pulse: 74 72  Resp: 20   Temp: 36.6 C   SpO2: 100%     Last Pain:  Vitals:   11/21/22 1200  TempSrc:   PainSc: 0-No pain                 Lamis Behrmann,E. [place]

## 2022-11-21 NOTE — Progress Notes (Signed)
Patient is back on the floor after procedure at 1300 PM. Alert and oriented. Patient refused to connect with cardiac monitor. Made provider aware. Will continue to monitor

## 2022-11-22 ENCOUNTER — Telehealth: Payer: Self-pay

## 2022-11-22 NOTE — Transitions of Care (Post Inpatient/ED Visit) (Unsigned)
11/22/2022  Name: Ryan Mosley MRN: [id number] DOB: 28-Jun-1940  Today's TOC FU Call Status: Today's TOC FU Call Status:: Unsuccessful Call (1st Attempt) Unsuccessful Call (1st Attempt) Date: 11/22/22  Attempted to reach the patient regarding the most recent Inpatient/ED visit.  Follow Up Plan: Additional outreach attempts will be made to reach the patient to complete the Transitions of Care (Post Inpatient/ED visit) call.  Signature Karena Addison, LPN [facility] Nurse Health Advisor Direct Dial [phone]

## 2022-11-23 ENCOUNTER — Telehealth: Payer: Self-pay | Admitting: Family Medicine

## 2022-11-23 ENCOUNTER — Encounter (HOSPITAL_COMMUNITY): Payer: Self-pay | Admitting: Gastroenterology

## 2022-11-23 NOTE — Transitions of Care (Post Inpatient/ED Visit) (Unsigned)
11/23/2022  Name: Ryan Mosley MRN: [id number] DOB: 07/12/1940  Today's TOC FU Call Status: Today's TOC FU Call Status:: Unsuccessful Call (2nd Attempt) Unsuccessful Call (1st Attempt) Date: 11/22/22 Unsuccessful Call (2nd Attempt) Date: 11/23/22  Attempted to reach the patient regarding the most recent Inpatient/ED visit.  Follow Up Plan: Additional outreach attempts will be made to reach the patient to complete the Transitions of Care (Post Inpatient/ED visit) call.   Signature Karena Addison, LPN [facility] Nurse Health Advisor Direct Dial [phone]

## 2022-11-23 NOTE — Telephone Encounter (Signed)
Received call from Dara with [facility] to follow up on referral they received for this patient and to advise Dr. Tanya Nones that the patient will be receiving palliative care from their facility.  Please advise with any questions at [phone].

## 2022-11-24 NOTE — Transitions of Care (Post Inpatient/ED Visit) (Signed)
11/24/2022  Name: Ryan Mosley MRN: [id number] DOB: 1940-03-07  Today's TOC FU Call Status: Today's TOC FU Call Status:: Unsuccessful Call (2nd Attempt) Unsuccessful Call (1st Attempt) Date: 11/22/22 Unsuccessful Call (2nd Attempt) Date: 11/23/22 Unsuccessful Call (3rd Attempt) Date: 11/24/22  Attempted to reach the patient regarding the most recent Inpatient/ED visit.  Follow Up Plan: No further outreach attempts will be made at this time. We have been unable to contact the patient.  Signature Karena Addison, LPN [facility] Nurse Health Advisor Direct Dial [phone]

## 2022-12-06 ENCOUNTER — Encounter: Payer: Self-pay | Admitting: Family Medicine

## 2022-12-06 ENCOUNTER — Ambulatory Visit (INDEPENDENT_AMBULATORY_CARE_PROVIDER_SITE_OTHER): Payer: Medicare Other | Admitting: Family Medicine

## 2022-12-06 VITALS — BP 156/92 | HR 80 | Temp 98.3°F | Ht 68.0 in | Wt 147.8 lb

## 2022-12-06 DIAGNOSIS — C20 Malignant neoplasm of rectum: Secondary | ICD-10-CM | POA: Diagnosis not present

## 2022-12-06 DIAGNOSIS — E119 Type 2 diabetes mellitus without complications: Secondary | ICD-10-CM | POA: Diagnosis not present

## 2022-12-06 DIAGNOSIS — Z23 Encounter for immunization: Secondary | ICD-10-CM | POA: Diagnosis not present

## 2022-12-06 DIAGNOSIS — I10 Essential (primary) hypertension: Secondary | ICD-10-CM | POA: Diagnosis not present

## 2022-12-06 MED ORDER — FLUTICASONE PROPIONATE 50 MCG/ACT NA SUSP: 2.0000 | Freq: Every day | NASAL | 6 refills | Status: DC

## 2022-12-06 NOTE — Addendum Note (Signed)
Addended by: Venia Carbon K on: 12/06/2022 08:47 AM   Modules accepted: Orders

## 2022-12-06 NOTE — Progress Notes (Signed)
Subjective:    Patient ID: Ryan Mosley, male    DOB: 1940-11-21, 82 y.o.   MRN: [id number]  HPI Patient has a well documented history of declining further treatment for his rectal cancer.  Was recently admitted to the hospital with a GI bleed: Admit date:     11/20/2022  Discharge date: 11/21/22  Discharge Physician: Jacquelin Hawking, MD    PCP: Donita Brooks, MD    Recommendations at discharge:  PCP visit for hospital follow-up Palliative care referral Repeat BMP in 3-5 days   Discharge Diagnoses: Principal Problem:   GI bleed Active Problems:   Hypertension   Diabetes mellitus without complication (HCC)   Hyperlipemia   Primary open angle glaucoma (POAG) of both eyes   BPH (benign prostatic hyperplasia)   Rectal cancer (HCC)   Resolved Problems:   * No resolved hospital problems. *   Hospital Course: Ryan Mosley is a 82 y.o. male with a history of diabetes mellitus type 2, hypertension, hyperlipidemia, rectal cancer, BPH, glaucoma.  Patient presented secondary to rectal bleeding with associated mild anemia.  CTA bleeding study was negative for identified hemorrhage.  Gastroenterology was consulted and performed flexible sigmoidoscopy which was identified oozing from his known rectal cancer. Patient continues to decline medical therapies for his cancer. He has clear understanding and shows capacity to make his own decisions. Family aware of his decisions as well with no objections shared.   Assessment and Plan:   GI bleeding Likely related to her known rectal cancer.  Patient with bright red blood per rectum.  No active bleeding noted on CTA abdominal study.  Fecal occult blood test positive.  Gastroenterology was consulted and performed a flexible sigmoidoscopy on 9/29 with evidence of oozing from known rectal mass; injected with PuraStat for hemostasis.   Acute anemia Secondary to GI bleeding.  Hemoglobin stable.   Rectal adenocarcinoma Diagnosed in 2022.  Patient  was gastroenterology, general surgery, medical oncology with consideration for radiation oncology at that time.  Patient declined therapy.  Patient continues to decline consideration for treatment of his cancer.  Will place a referral to palliative care on discharge.   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  Patient continues to decline workup/management.   Hypokalemia Potassium is low was 2.9 this admission.  Patient treated with potassium. Continue potassium supplementation on discharge. Recommend repeat BMP as an outpatient in 3-5 days.   Diabetes mellitus type 2 Appears to be controlled, although last hemoglobin A1c was from July 2023 at 6.5%.   Hyperlipidemia Continue Crestor.   Primary hypertension Continue amlodipine and lisinopril.   12/06/22 Here for follow up.  Patient states he has not been taking his amlodipine because it "makes him feel funny".  He does take lisinopril.  We discussed his diagnosis.  I was very clear with the patient.  I explained to him that he has rectal cancer.  Also explained to him that the CT scan shows that the mass in his lung has gotten bigger and this is most likely lung cancer.  Also explained to him that there is a spot on his liver that could be spread from the rectal cancer to liver.  At first, he states that people have not told him anything.  He is repeated this to me on several different occasions despite telling him his diagnosis and every time.  After having a discussion however he admits that he has he has cancer.  He also admits that he does not  want to do any treatment for it.  He is not taking any iron.  He also has not taking any more potassium Past Medical History:  Diagnosis Date   Acute blood loss anemia    on meds   Blood transfusion without reported diagnosis 2022   Cataract    bilateral sx   Controlled diabetes mellitus type 2 with complications (HCC)    Diabetes mellitus without complication (HCC)    not on meds at  this time   GERD (gastroesophageal reflux disease)    hx of   GI bleed    Hyperlipidemia    on meds   Hypertension    on meds   Meningitis 1960/1970   Pneumonia 1960/1970   Rectal polyp    Past Surgical History:  Procedure Laterality Date   BIOPSY  07/08/2020   Procedure: BIOPSY;  Surgeon: Tressia Danas, MD;  Location: [facility] ENDOSCOPY;  Service: Gastroenterology;;   COLONOSCOPY WITH PROPOFOL N/A 07/08/2020   Procedure: COLONOSCOPY WITH PROPOFOL;  Surgeon: Tressia Danas, MD;  Location: [facility] ENDOSCOPY;  Service: Gastroenterology;  Laterality: N/A;   DENTAL SURGERY     all teeth removed   FLEXIBLE SIGMOIDOSCOPY N/A 11/21/2022   Procedure: FLEXIBLE SIGMOIDOSCOPY;  Surgeon: Jeani Hawking, MD;  Location: [facility] ENDOSCOPY;  Service: Gastroenterology;  Laterality: N/A;   HEMOSTASIS CONTROL  11/21/2022   Procedure: HEMOSTASIS CONTROL;  Surgeon: Jeani Hawking, MD;  Location: [facility] ENDOSCOPY;  Service: Gastroenterology;;  PuraStat   XI ROBOT ASSISTED TRANSANAL RESECTION N/A 09/25/2020   Procedure: TAMIS PARTIAL PROCTECTOMY OF RECTAL MASS;  Surgeon: Karie Soda, MD;  Location: [facility] ORS;  Service: General;  Laterality: N/A;   Current Outpatient Medications on File Prior to Visit  Medication Sig Dispense Refill   acetaminophen (TYLENOL) 500 MG tablet Take 500 mg by mouth 2 (two) times daily as needed for moderate pain, headache or fever.     amLODipine (NORVASC) 5 MG tablet TAKE 1 TABLET BY MOUTH EVERY DAY FOR BLOOD PRESSURE 90 tablet 2   latanoprost (XALATAN) 0.005 % ophthalmic solution Place 1 drop into both eyes at bedtime.  3   lisinopril (ZESTRIL) 40 MG tablet TAKE 1 TABLET BY MOUTH EVERY DAY 90 tablet 3   potassium chloride SA (KLOR-CON M) 20 MEQ tablet Take 2 tablets (40 mEq total) by mouth daily for 2 days. 4 tablet 0   rosuvastatin (CRESTOR) 5 MG tablet TAKE 1 TABLET BY MOUTH ONCE A WEEK FOR CHOLESTEROL (Patient taking differently: Take 5 mg by mouth every Monday.) 8 tablet 0   No current  facility-administered medications on file prior to visit.   Allergies  Allergen Reactions   Glucophage [Metformin] Diarrhea   Social History   Socioeconomic History   Marital status: Married    Spouse name: Corene   Number of children: 4   Years of education: 7   Highest education level: Not on file  Occupational History   Occupation: retired    Comment: farmer  Tobacco Use   Smoking status: Former    Current packs/day: 0.00    Average packs/day: 1 pack/day for 29.0 years (29.0 ttl pk-yrs)    Types: Cigarettes    Start date: 33    Quit date: 1989    Years since quitting: 35.8   Smokeless tobacco: Current    Types: Chew  Vaping Use   Vaping status: Never Used  Substance and Sexual Activity   Alcohol use: Not Currently    Alcohol/week: 1.0 standard drink of alcohol  Types: 1 Standard drinks or equivalent per week   Drug use: Never   Sexual activity: Yes  Other Topics Concern   Not on file  Social History Narrative   Married, 4 children living in [place] and [place]   Social Determinants of Health   Financial Resource Strain: Low Risk  (10/21/2022)   Overall Financial Resource Strain (CARDIA)    Difficulty of Paying Living Expenses: Not hard at all  Food Insecurity: No Food Insecurity (11/20/2022)   Hunger Vital Sign    Worried About Running Out of Food in the Last Year: Never true    Ran Out of Food in the Last Year: Never true  Transportation Needs: No Transportation Needs (11/20/2022)   PRAPARE - Administrator, Civil Service (Medical): No    Lack of Transportation (Non-Medical): No  Physical Activity: Sufficiently Active (10/21/2022)   Exercise Vital Sign    Days of Exercise per Week: 5 days    Minutes of Exercise per Session: 30 min  Stress: No Stress Concern Present (10/21/2022)   [employer] of Occupational Health - Occupational Stress Questionnaire    Feeling of Stress : Not at all  Social Connections: Socially Isolated  (10/21/2022)   Social Connection and Isolation Panel [NHANES]    Frequency of Communication with Friends and Family: Never    Frequency of Social Gatherings with Friends and Family: Twice a week    Attends Religious Services: Never    Database administrator or Organizations: No    Attends Banker Meetings: Never    Marital Status: Married  Catering manager Violence: Patient Declined (11/20/2022)   Humiliation, Afraid, Rape, and Kick questionnaire    Fear of Current or Ex-Partner: Patient declined    Emotionally Abused: Patient declined    Physically Abused: Patient declined    Sexually Abused: Patient declined     Review of Systems  All other systems reviewed and are negative.      Objective:   Physical Exam Vitals reviewed.  Constitutional:      Appearance: Normal appearance. He is normal weight.  Cardiovascular:     Rate and Rhythm: Normal rate and regular rhythm.     Heart sounds: Normal heart sounds. No murmur heard.    No gallop.  Pulmonary:     Effort: Pulmonary effort is normal. No respiratory distress.     Breath sounds: Normal breath sounds. No wheezing or rhonchi.  Abdominal:     General: Bowel sounds are normal. There is no distension.     Palpations: Abdomen is soft.     Tenderness: There is no abdominal tenderness. There is no guarding.  Genitourinary:    Rectum: Normal.  Musculoskeletal:     Right lower leg: No edema.     Left lower leg: No edema.  Neurological:     Mental Status: He is alert.           Assessment & Plan:  Rectal cancer (HCC) - Plan: CBC with Differential/Platelet, COMPLETE METABOLIC PANEL WITH GFR, Hemoglobin A1c  Benign essential HTN - Plan: CBC with Differential/Platelet, COMPLETE METABOLIC PANEL WITH GFR, Hemoglobin A1c  Diabetes mellitus without complication (HCC) - Plan: CBC with Differential/Platelet, COMPLETE METABOLIC PANEL WITH GFR, Hemoglobin A1c Patient appears to have metastatic spread from the rectal  cancer.  He refuses treatment.  I again offered the patient referral to an oncologist to discuss his options for treatment but he declines this.  Therefore we will not focus on quality  of life.  I am fine with vomiting at times.  I recommended ferrous sulfate 325 mg daily for anemia.  I will recheck his potassium today along with his blood sugar and his kidney function.  Patient received his flu shot today.  If the patient changes his mind I will be happy to refer the patient to oncology however he is adamant that he does not want to do that at this point

## 2022-12-07 LAB — COMPLETE METABOLIC PANEL WITH GFR
AG Ratio: 1.4 (calc) (ref 1.0–2.5)
ALT: 18 U/L (ref 9–46)
AST: 20 U/L (ref 10–35)
Albumin: 4.4 g/dL (ref 3.6–5.1)
Alkaline phosphatase (APISO): 78 U/L (ref 35–144)
BUN: 7 mg/dL (ref 7–25)
CO2: 27 mmol/L (ref 20–32)
Calcium: 9.4 mg/dL (ref 8.6–10.3)
Chloride: 103 mmol/L (ref 98–110)
Creat: 0.75 mg/dL (ref 0.70–1.22)
Globulin: 3.1 g/dL (ref 1.9–3.7)
Glucose, Bld: 123 mg/dL — ABNORMAL HIGH (ref 65–99)
Potassium: 3.3 mmol/L — ABNORMAL LOW (ref 3.5–5.3)
Sodium: 142 mmol/L (ref 135–146)
Total Bilirubin: 0.5 mg/dL (ref 0.2–1.2)
Total Protein: 7.5 g/dL (ref 6.1–8.1)
eGFR: 91 mL/min/{1.73_m2} (ref >=60)

## 2022-12-07 LAB — CBC WITH DIFFERENTIAL/PLATELET
Absolute Monocytes: 686 {cells}/uL (ref 200–950)
Basophils Absolute: 31 {cells}/uL (ref 0–200)
Basophils Relative: 0.4 %
Eosinophils Absolute: 140 {cells}/uL (ref 15–500)
Eosinophils Relative: 1.8 %
HCT: 36.6 % — ABNORMAL LOW (ref 38.5–50.0)
Hemoglobin: 12 g/dL — ABNORMAL LOW (ref 13.2–17.1)
Lymphs Abs: 1513 {cells}/uL (ref 850–3900)
MCH: 30.2 pg (ref 27.0–33.0)
MCHC: 32.8 g/dL (ref 32.0–36.0)
MCV: 92 fL (ref 80.0–100.0)
MPV: 9.8 fL (ref 7.5–12.5)
Monocytes Relative: 8.8 %
Neutro Abs: 5429 {cells}/uL (ref 1500–7800)
Neutrophils Relative %: 69.6 %
Platelets: 283 10*3/uL (ref 140–400)
RBC: 3.98 10*6/uL — ABNORMAL LOW (ref 4.20–5.80)
RDW: 12 % (ref 11.0–15.0)
Total Lymphocyte: 19.4 %
WBC: 7.8 10*3/uL (ref 3.8–10.8)

## 2022-12-07 LAB — HEMOGLOBIN A1C
Hgb A1c MFr Bld: 6.4 %{Hb} — ABNORMAL HIGH (ref <5.7)
Mean Plasma Glucose: 137 mg/dL
eAG (mmol/L): 7.6 mmol/L

## 2022-12-09 ENCOUNTER — Other Ambulatory Visit: Payer: Self-pay

## 2022-12-09 MED ORDER — POTASSIUM CHLORIDE CRYS ER 10 MEQ PO TBCR: 10.0000 meq | EXTENDED_RELEASE_TABLET | Freq: Two times a day (BID) | ORAL | 0 refills | Status: DC

## 2022-12-12 ENCOUNTER — Other Ambulatory Visit: Payer: Self-pay | Admitting: Family Medicine

## 2022-12-13 NOTE — Telephone Encounter (Signed)
Requested medications are due for refill today.  Unsure  Requested medications are on the active medications list.  yes  Last refill. 11/22/2022 #4 1OX  Future visit scheduled.   yes  Notes to clinic.  Please review for refill.    Requested Prescriptions  Pending Prescriptions Disp Refills   KLOR-CON M10 10 MEQ tablet [Pharmacy Med Name: KLOR-CON M10 TABLET] 90 tablet 0    Sig: TAKE 1 TABLET BY MOUTH 2 TIMES DAILY.     Endocrinology:  Minerals - Potassium Supplementation Failed - 12/12/2022 11:32 AM      Failed - K in normal range and within 360 days    Potassium  Date Value Ref Range Status  12/06/2022 3.3 (L) 3.5 - 5.3 mmol/L Final         Failed - Valid encounter within last 12 months    Recent Outpatient Visits           1 year ago Benign essential HTN   [facility] Donita Brooks, MD   2 years ago Pulmonary nodule 1 cm or greater in diameter   [facility] [place], Priscille Heidelberg, MD   2 years ago Gastrointestinal hemorrhage, unspecified gastrointestinal hemorrhage type   [facility] Donita Brooks, MD   2 years ago Routine general medical examination at a health care facility   [facility] [place], Velna Hatchet, MD   3 years ago Essential hypertension   [facility] [place], Velna Hatchet, MD       Future Appointments             In 5 months Pickard, Priscille Heidelberg, MD [facility]            Passed - Cr in normal range and within 360 days    Creat  Date Value Ref Range Status  12/06/2022 0.75 0.70 - 1.22 mg/dL Final   Creatinine, Urine  Date Value Ref Range Status  09/17/2019 102 20 - 320 mg/dL Final

## 2023-02-08 ENCOUNTER — Ambulatory Visit: Payer: Self-pay | Admitting: Family Medicine

## 2023-02-08 NOTE — Telephone Encounter (Signed)
Copied from CRM [id number]. Topic: Clinical - Red Word Triage >> Feb 08, 2023  8:15 AM Deaijah H wrote: Red Word that prompted transfer to Nurse Triage: Feet swole/congestion in head  Chief Complaint: bilateral feet swollen, painful and numb Symptoms: swollen feet, pain in right foot greater than left Frequency: started two days ago Pertinent Negatives: Patient denies fever, weakness, sob Disposition: [x] ED /[] Urgent Care (no appt availability in office) / [] Appointment(In office/virtual)/ []  [place] [facility]/ [] Home Care/ [] Refused Recommended Disposition /[] [place] [facility]/ []  Follow-up with PCP Additional Notes: Wife called with c/o husbands bilateral feet swollen, painful, numb and cold.  Instructed wife to take patient to ER.  Patient refused ER.  Apt. Made for Monday to be seen by pcp as per patient request.   Instructed to go to the er if becomes worse.  Reason for Disposition  [1] Swollen foot AND [2] no fever  (Exceptions: localized bump from bunions, calluses, insect bite, sting)  Answer Assessment - Initial Assessment Questions 1. ONSET: "When did the pain start?"      The other day 2. LOCATION: "Where is the pain located?"      Bilateral feet, right is worse than left; and swelling 3. PAIN: "How bad is the pain?"    (Scale 1-10; or mild, moderate, severe)  - MILD (1-3): doesn't interfere with normal activities.   - MODERATE (4-7): interferes with normal activities (e.g., work or school) or awakens from sleep, limping.   - SEVERE (8-10): excruciating pain, unable to do any normal activities, unable to walk.      Can't sleep, feet feel cold 5. CAUSE: "What do you think is causing the foot pain?"     swelling 6. OTHER SYMPTOMS: "Do you have any other symptoms?" (e.g., leg pain, rash, fever, numbness)     Numbness and cold  Protocols used: Foot Pain-A-AH

## 2023-02-11 ENCOUNTER — Inpatient Hospital Stay (HOSPITAL_COMMUNITY)
Admission: EM | Admit: 2023-02-11 | Discharge: 2023-02-14 | DRG: 374 | Disposition: A | Discharge status: Hospice | Payer: Medicare Other | Attending: Family Medicine | Admitting: Family Medicine

## 2023-02-11 ENCOUNTER — Emergency Department (HOSPITAL_COMMUNITY): Payer: Medicare Other

## 2023-02-11 ENCOUNTER — Encounter (HOSPITAL_COMMUNITY): Payer: Self-pay | Admitting: *Deleted

## 2023-02-11 ENCOUNTER — Ambulatory Visit (HOSPITAL_COMMUNITY)
Admission: EM | Admit: 2023-02-11 | Discharge: 2023-02-11 | Disposition: A | Discharge status: Home / Self Care | Payer: Medicare Other | Attending: Emergency Medicine | Admitting: Emergency Medicine

## 2023-02-11 ENCOUNTER — Other Ambulatory Visit: Payer: Self-pay

## 2023-02-11 ENCOUNTER — Telehealth (HOSPITAL_COMMUNITY): Payer: Self-pay | Admitting: Emergency Medicine

## 2023-02-11 ENCOUNTER — Encounter (HOSPITAL_COMMUNITY): Payer: Self-pay | Admitting: Emergency Medicine

## 2023-02-11 DIAGNOSIS — H401130 Primary open-angle glaucoma, bilateral, stage unspecified: Secondary | ICD-10-CM | POA: Diagnosis not present

## 2023-02-11 DIAGNOSIS — E119 Type 2 diabetes mellitus without complications: Secondary | ICD-10-CM | POA: Diagnosis present

## 2023-02-11 DIAGNOSIS — C7801 Secondary malignant neoplasm of right lung: Secondary | ICD-10-CM | POA: Diagnosis present

## 2023-02-11 DIAGNOSIS — F32A Depression, unspecified: Secondary | ICD-10-CM | POA: Diagnosis present

## 2023-02-11 DIAGNOSIS — I110 Hypertensive heart disease with heart failure: Secondary | ICD-10-CM | POA: Diagnosis not present

## 2023-02-11 DIAGNOSIS — Z515 Encounter for palliative care: Secondary | ICD-10-CM

## 2023-02-11 DIAGNOSIS — E440 Moderate protein-calorie malnutrition: Secondary | ICD-10-CM | POA: Diagnosis present

## 2023-02-11 DIAGNOSIS — T501X5A Adverse effect of loop [high-ceiling] diuretics, initial encounter: Secondary | ICD-10-CM | POA: Diagnosis present

## 2023-02-11 DIAGNOSIS — K219 Gastro-esophageal reflux disease without esophagitis: Secondary | ICD-10-CM | POA: Diagnosis not present

## 2023-02-11 DIAGNOSIS — I5023 Acute on chronic systolic (congestive) heart failure: Secondary | ICD-10-CM | POA: Diagnosis not present

## 2023-02-11 DIAGNOSIS — I2489 Other forms of acute ischemic heart disease: Secondary | ICD-10-CM | POA: Diagnosis present

## 2023-02-11 DIAGNOSIS — K921 Melena: Secondary | ICD-10-CM | POA: Diagnosis present

## 2023-02-11 DIAGNOSIS — T380X5A Adverse effect of glucocorticoids and synthetic analogues, initial encounter: Secondary | ICD-10-CM | POA: Diagnosis present

## 2023-02-11 DIAGNOSIS — J329 Chronic sinusitis, unspecified: Secondary | ICD-10-CM

## 2023-02-11 DIAGNOSIS — E782 Mixed hyperlipidemia: Secondary | ICD-10-CM | POA: Diagnosis not present

## 2023-02-11 DIAGNOSIS — C787 Secondary malignant neoplasm of liver and intrahepatic bile duct: Secondary | ICD-10-CM | POA: Diagnosis not present

## 2023-02-11 DIAGNOSIS — Z833 Family history of diabetes mellitus: Secondary | ICD-10-CM | POA: Diagnosis not present

## 2023-02-11 DIAGNOSIS — R911 Solitary pulmonary nodule: Secondary | ICD-10-CM | POA: Diagnosis not present

## 2023-02-11 DIAGNOSIS — I10 Essential (primary) hypertension: Secondary | ICD-10-CM | POA: Diagnosis not present

## 2023-02-11 DIAGNOSIS — R590 Localized enlarged lymph nodes: Secondary | ICD-10-CM | POA: Diagnosis not present

## 2023-02-11 DIAGNOSIS — Z6820 Body mass index (BMI) 20.0-20.9, adult: Secondary | ICD-10-CM

## 2023-02-11 DIAGNOSIS — R0602 Shortness of breath: Principal | ICD-10-CM

## 2023-02-11 DIAGNOSIS — I5022 Chronic systolic (congestive) heart failure: Secondary | ICD-10-CM | POA: Diagnosis present

## 2023-02-11 DIAGNOSIS — Z79899 Other long term (current) drug therapy: Secondary | ICD-10-CM

## 2023-02-11 DIAGNOSIS — I272 Pulmonary hypertension, unspecified: Secondary | ICD-10-CM | POA: Diagnosis present

## 2023-02-11 DIAGNOSIS — I2510 Atherosclerotic heart disease of native coronary artery without angina pectoris: Secondary | ICD-10-CM | POA: Diagnosis not present

## 2023-02-11 DIAGNOSIS — R1310 Dysphagia, unspecified: Secondary | ICD-10-CM | POA: Diagnosis not present

## 2023-02-11 DIAGNOSIS — Z66 Do not resuscitate: Secondary | ICD-10-CM | POA: Diagnosis not present

## 2023-02-11 DIAGNOSIS — R600 Localized edema: Secondary | ICD-10-CM

## 2023-02-11 DIAGNOSIS — E785 Hyperlipidemia, unspecified: Secondary | ICD-10-CM | POA: Diagnosis not present

## 2023-02-11 DIAGNOSIS — R9431 Abnormal electrocardiogram [ECG] [EKG]: Secondary | ICD-10-CM | POA: Diagnosis not present

## 2023-02-11 DIAGNOSIS — R64 Cachexia: Secondary | ICD-10-CM | POA: Diagnosis not present

## 2023-02-11 DIAGNOSIS — C20 Malignant neoplasm of rectum: Secondary | ICD-10-CM | POA: Diagnosis not present

## 2023-02-11 DIAGNOSIS — R7989 Other specified abnormal findings of blood chemistry: Secondary | ICD-10-CM | POA: Diagnosis present

## 2023-02-11 DIAGNOSIS — R079 Chest pain, unspecified: Secondary | ICD-10-CM | POA: Diagnosis not present

## 2023-02-11 DIAGNOSIS — F17220 Nicotine dependence, chewing tobacco, uncomplicated: Secondary | ICD-10-CM | POA: Diagnosis present

## 2023-02-11 DIAGNOSIS — R918 Other nonspecific abnormal finding of lung field: Secondary | ICD-10-CM | POA: Diagnosis not present

## 2023-02-11 DIAGNOSIS — D62 Acute posthemorrhagic anemia: Secondary | ICD-10-CM | POA: Diagnosis not present

## 2023-02-11 DIAGNOSIS — E876 Hypokalemia: Secondary | ICD-10-CM | POA: Diagnosis not present

## 2023-02-11 DIAGNOSIS — D649 Anemia, unspecified: Secondary | ICD-10-CM | POA: Diagnosis not present

## 2023-02-11 DIAGNOSIS — J90 Pleural effusion, not elsewhere classified: Secondary | ICD-10-CM | POA: Diagnosis not present

## 2023-02-11 DIAGNOSIS — J8410 Pulmonary fibrosis, unspecified: Secondary | ICD-10-CM | POA: Diagnosis not present

## 2023-02-11 DIAGNOSIS — N400 Enlarged prostate without lower urinary tract symptoms: Secondary | ICD-10-CM | POA: Diagnosis present

## 2023-02-11 DIAGNOSIS — D72829 Elevated white blood cell count, unspecified: Secondary | ICD-10-CM | POA: Diagnosis present

## 2023-02-11 DIAGNOSIS — R791 Abnormal coagulation profile: Secondary | ICD-10-CM | POA: Diagnosis present

## 2023-02-11 DIAGNOSIS — K922 Gastrointestinal hemorrhage, unspecified: Secondary | ICD-10-CM | POA: Diagnosis not present

## 2023-02-11 DIAGNOSIS — K625 Hemorrhage of anus and rectum: Secondary | ICD-10-CM | POA: Diagnosis not present

## 2023-02-11 LAB — CBC WITH DIFFERENTIAL/PLATELET
Abs Immature Granulocytes: 0.05 10*3/uL (ref 0.00–0.07)
Abs Immature Granulocytes: 0.05 10*3/uL (ref 0.00–0.07)
Basophils Absolute: 0.1 10*3/uL (ref 0.0–0.1)
Basophils Absolute: 0 10*3/uL (ref 0.0–0.1)
Basophils Relative: 1 %
Basophils Relative: 0 %
Eosinophils Absolute: 0.1 10*3/uL (ref 0.0–0.5)
Eosinophils Absolute: 0 10*3/uL (ref 0.0–0.5)
Eosinophils Relative: 1 %
Eosinophils Relative: 0 %
HCT: 25.4 % — ABNORMAL LOW (ref 39.0–52.0)
HCT: 24.3 % — ABNORMAL LOW (ref 39.0–52.0)
Hemoglobin: 7.6 g/dL — ABNORMAL LOW (ref 13.0–17.0)
Hemoglobin: 7.4 g/dL — ABNORMAL LOW (ref 13.0–17.0)
Immature Granulocytes: 0 %
Immature Granulocytes: 1 %
Lymphocytes Relative: 11 %
Lymphocytes Relative: 4 %
Lymphs Abs: 1.4 10*3/uL (ref 0.7–4.0)
Lymphs Abs: 0.4 10*3/uL — ABNORMAL LOW (ref 0.7–4.0)
MCH: 28.1 pg (ref 26.0–34.0)
MCH: 28.1 pg (ref 26.0–34.0)
MCHC: 29.9 g/dL — ABNORMAL LOW (ref 30.0–36.0)
MCHC: 30.5 g/dL (ref 30.0–36.0)
MCV: 94.1 fL (ref 80.0–100.0)
MCV: 92.4 fL (ref 80.0–100.0)
Monocytes Absolute: 1.1 10*3/uL — ABNORMAL HIGH (ref 0.1–1.0)
Monocytes Absolute: 0.1 10*3/uL (ref 0.1–1.0)
Monocytes Relative: 9 %
Monocytes Relative: 1 %
Neutro Abs: 9.7 10*3/uL — ABNORMAL HIGH (ref 1.7–7.7)
Neutro Abs: 7.9 10*3/uL — ABNORMAL HIGH (ref 1.7–7.7)
Neutrophils Relative %: 78 %
Neutrophils Relative %: 94 %
Platelets: 362 10*3/uL (ref 150–400)
Platelets: 370 10*3/uL (ref 150–400)
RBC: 2.7 MIL/uL — ABNORMAL LOW (ref 4.22–5.81)
RBC: 2.63 MIL/uL — ABNORMAL LOW (ref 4.22–5.81)
RDW: 14.1 % (ref 11.5–15.5)
RDW: 14.1 % (ref 11.5–15.5)
WBC: 12.3 10*3/uL — ABNORMAL HIGH (ref 4.0–10.5)
WBC: 8.3 10*3/uL (ref 4.0–10.5)
nRBC: 0 % (ref 0.0–0.2)
nRBC: 0 % (ref 0.0–0.2)

## 2023-02-11 LAB — COMPREHENSIVE METABOLIC PANEL
ALT: 16 U/L (ref 0–44)
ALT: 17 U/L (ref 0–44)
AST: 29 U/L (ref 15–41)
AST: 28 U/L (ref 15–41)
Albumin: 3.3 g/dL — ABNORMAL LOW (ref 3.5–5.0)
Albumin: 3.2 g/dL — ABNORMAL LOW (ref 3.5–5.0)
Alkaline Phosphatase: 69 U/L (ref 38–126)
Alkaline Phosphatase: 70 U/L (ref 38–126)
Anion gap: 11 (ref 5–15)
Anion gap: 13 (ref 5–15)
BUN: 9 mg/dL (ref 8–23)
BUN: 10 mg/dL (ref 8–23)
CO2: 27 mmol/L (ref 22–32)
CO2: 24 mmol/L (ref 22–32)
Calcium: 8.9 mg/dL (ref 8.9–10.3)
Calcium: 9 mg/dL (ref 8.9–10.3)
Chloride: 105 mmol/L (ref 98–111)
Chloride: 102 mmol/L (ref 98–111)
Creatinine, Ser: 1.03 mg/dL (ref 0.61–1.24)
Creatinine, Ser: 0.91 mg/dL (ref 0.61–1.24)
GFR, Estimated: >60 mL/min (ref >60)
GFR, Estimated: >60 mL/min (ref >60)
Glucose, Bld: 111 mg/dL — ABNORMAL HIGH (ref 70–99)
Glucose, Bld: 182 mg/dL — ABNORMAL HIGH (ref 70–99)
Potassium: 3.5 mmol/L (ref 3.5–5.1)
Potassium: 3.4 mmol/L — ABNORMAL LOW (ref 3.5–5.1)
Sodium: 143 mmol/L (ref 135–145)
Sodium: 139 mmol/L (ref 135–145)
Total Bilirubin: 0.6 mg/dL (ref <1.2)
Total Bilirubin: 0.5 mg/dL (ref <1.2)
Total Protein: 6.8 g/dL (ref 6.5–8.1)
Total Protein: 6.6 g/dL (ref 6.5–8.1)

## 2023-02-11 LAB — TROPONIN I (HIGH SENSITIVITY)
Troponin I (High Sensitivity): 529 ng/L (ref <18)
Troponin I (High Sensitivity): 510 ng/L (ref <18)

## 2023-02-11 LAB — BRAIN NATRIURETIC PEPTIDE
B Natriuretic Peptide: 1199.4 pg/mL — ABNORMAL HIGH (ref 0.0–100.0)
B Natriuretic Peptide: 812.7 pg/mL — ABNORMAL HIGH (ref 0.0–100.0)

## 2023-02-11 LAB — APTT: aPTT: 27 s (ref 24–36)

## 2023-02-11 LAB — PROTIME-INR
INR: 1.2 (ref 0.8–1.2)
Prothrombin Time: 15.3 s — ABNORMAL HIGH (ref 11.4–15.2)

## 2023-02-11 LAB — MAGNESIUM: Magnesium: 2.2 mg/dL (ref 1.7–2.4)

## 2023-02-11 LAB — PREPARE RBC (CROSSMATCH)

## 2023-02-11 MED ORDER — DEXAMETHASONE SODIUM PHOSPHATE 10 MG/ML IJ SOLN
10.0000 mg | Freq: Once | INTRAMUSCULAR | Status: AC
  Administered 2023-02-11: 10 mg via INTRAMUSCULAR

## 2023-02-11 MED ORDER — AZITHROMYCIN 250 MG PO TABS: 250.0000 mg | ORAL_TABLET | Freq: Every day | ORAL | 0 refills | Status: DC

## 2023-02-11 MED ORDER — ONDANSETRON HCL 4 MG/2ML IJ SOLN: 4.0000 mg | Freq: Four times a day (QID) | INTRAMUSCULAR | Status: DC | PRN

## 2023-02-11 MED ORDER — SODIUM CHLORIDE 0.9% IV SOLUTION: Freq: Once | INTRAVENOUS | Status: DC

## 2023-02-11 MED ORDER — MELATONIN 3 MG PO TABS: 3.0000 mg | ORAL_TABLET | Freq: Every evening | ORAL | Status: DC | PRN

## 2023-02-11 MED ORDER — ACETAMINOPHEN 325 MG PO TABS: 650.0000 mg | ORAL_TABLET | Freq: Four times a day (QID) | ORAL | Status: DC | PRN

## 2023-02-11 MED ORDER — DEXAMETHASONE SODIUM PHOSPHATE 10 MG/ML IJ SOLN
INTRAMUSCULAR | Status: AC
  Filled 2023-02-11: qty 1

## 2023-02-11 MED ORDER — IOHEXOL 350 MG/ML SOLN
75.0000 mL | Freq: Once | INTRAVENOUS | Status: AC | PRN
  Administered 2023-02-11: 75 mL via INTRAVENOUS

## 2023-02-11 MED ORDER — ACETAMINOPHEN 650 MG RE SUPP: 650.0000 mg | Freq: Four times a day (QID) | RECTAL | Status: DC | PRN

## 2023-02-11 NOTE — ED Triage Notes (Signed)
The  pt reports sob for one week with both feet and legs swollen he was seen at [facility] earlier today and he was called at home and told to come back in due to fluid around his heart

## 2023-02-11 NOTE — ED Provider Triage Note (Signed)
Emergency Medicine Provider Triage Evaluation Note  Ryan Mosley , a 82 y.o. male  was evaluated in triage.  Pt complains of shortness of breath. States same has been ongoing x 1 week. Notes both his legs are swollen as well. Went to urgent care, had labs drawn, was sent here for elevated BNP, concerned for new heart failure. No history of same. Does have metastatic cancer. Denies SOB currently, states he only has dyspnea when he lays flat or with exertion. Does endorse some tightness in his chest as well.  Review of Systems  Positive:  Negative:   Physical Exam  BP 133/85 (BP Location: Left Leg)   Pulse 88   Resp 16   Ht 5\' 8"  (1.727 m)   Wt 67 kg   SpO2 100%   BMI 22.46 kg/m  Gen:   Awake, no distress   Resp:  Normal effort  MSK:   Moves extremities without difficulty  Other:  Bilateral pitting edema without pain in both extremities.   Medical Decision Making  Medically screening exam initiated at 6:15 PM.  Appropriate orders placed.  Ryan Mosley was informed that the remainder of the evaluation will be completed by another provider, this initial triage assessment does not replace that evaluation, and the importance of remaining in the ED until their evaluation is complete.  CBC, CMP, and BNP collected at UC earlier today. BNP 1,199.4, WBC 12.3, hgb 7.6 down from 12 2 months ago   Ryan Mosley 02/11/23 1610

## 2023-02-11 NOTE — Telephone Encounter (Signed)
Discussed abnormal labs and elevated BNP, concern for new onset heart failure, suggested further evaluation at the nearest emergency department.  Patient reports he does not have any way to transport himself there tonight.  Discussed 911 is always an option, patient reports he is not willing to pay the amount.  Encouraged him to get to the emergency department as soon as transport becomes available or if any symptoms change.  Patient verbalized understanding, no questions at this time.

## 2023-02-11 NOTE — Discharge Instructions (Signed)
For the lower extremity swelling please use compression stockings and elevate your feet when at rest.  We are checking some basic labs and I will contact you if anything is emergent.  Please follow-up with your primary care provider as scheduled.  For your ongoing congestion we are going to trial antibiotics and a steroid shot.  You can consider an over-the-counter saline nasal spray to help moisten your secretions.  If you have any new, concerning symptoms or any worsening of your symptoms do not hesitate to seek further care at the emergency department where they can perform advanced evaluation.

## 2023-02-11 NOTE — ED Notes (Signed)
Was able to get into contact with lab. Lab tech stated that none of the labs were present. All labs were sent and collected at 2013. Called the blood bank to see if they had the labs, they reported they did not receive any labs. EDP A. Tee MD notified. Will collect another set of labs.

## 2023-02-11 NOTE — ED Provider Notes (Signed)
[place] EMERGENCY DEPARTMENT AT [facility] Provider Note  HPI   Ryan Mosley is a 82 y.o. male patient with a PMHx of diabetes mellitus type 2, hypertension, hyperlipidemia, rectal cancer, BPH, glaucoma who is here today with concern for edema in his legs and shortness of breath.  Patient states that for the past couple of days, he has been having some shortness of breath, noticed some swelling in his lower extremities.  He also states that he has been bleeding from his bottom for the past couple of months.  Has history of rectal cancer that is not getting treatment.  Was seen in urgent care today, noticed elevated BNP greater than 1000, and sent him in for evaluation.  He is had some chest pain with exertion, but when asked is more so shortness of breath    ROS Negative except as per HPI   Medical Decision Making   Upon presentation, the patient is afebrile hemodynamically stable overall appears well but has grossly melanotic stool that is Hemoccult positive on my independent exam.  Upon chart review, a few months ago in September 2024, he had a discharge after GI bleeding in the setting of his known rectal cancer, ultimately required a flexible sigmoidoscopy on 11/21/2022, oozing from known rectal mass, achieved hemostasis, was anemic at that time with known metastasis, as well as many other comorbid medical problems.  Of note, the patient is not interested in receiving any type of treatment for his cancer.  Upon my evaluation of the patient, EKG was obtained, does show some ST depressions laterally in V5 V6, a troponin was obtained prior to me seeing the patient was elevated at 529.  We obtained additional labs, however unfortunately they did not reach a lab so they were resent.  We type and screen individual, and labs reviewed from outside hospital the urgent care, and he was anemic in the low sevens.  Finally labs were able to come back, his second troponin came down to 510,  his hemoglobin continues to be low at 7.4, his BNP is elevated 812 which is down from earlier this morning, and his potassium is 3.4.  We did obtain a PE study given his elevated troponin and elevated BNP and some mild tachypnea he had no oxygen requirement was not hypoxic.  Ultimately PE scan came back negative and his chest x-ray showed no signs of focal pneumonia.  mphysematous changes and scattered fibrosis in the lungs. Small  bilateral pleural effusions.   For this patient we are going to transfuse 1 unit PRBC, and admit the patient to the medicine service.  I did briefly talk to the cardiology service prior to the resolve the second troponin, and together we collectively felt that his troponin elevation was more likely in the setting of demand ischemia given his low hemoglobin which is why we are going to transfuse the blood.  His magnesium was 2.2.  Will admit the patient to the medicine service at this time   1. Shortness of breath     @DISPOSITION @  Rx / DC Orders ED Discharge Orders     None        Past Medical History:  Diagnosis Date   Acute blood loss anemia    on meds   Blood transfusion without reported diagnosis 2022   Cataract    bilateral sx   Controlled diabetes mellitus type 2 with complications (HCC)    Diabetes mellitus without complication (HCC)    not on meds  at this time   GERD (gastroesophageal reflux disease)    hx of   GI bleed    Hyperlipidemia    on meds   Hypertension    on meds   Meningitis 1960/1970   Pneumonia 1960/1970   Rectal polyp    Past Surgical History:  Procedure Laterality Date   BIOPSY  07/08/2020   Procedure: BIOPSY;  Surgeon: Tressia Danas, MD;  Location: [facility] ENDOSCOPY;  Service: Gastroenterology;;   COLONOSCOPY WITH PROPOFOL N/A 07/08/2020   Procedure: COLONOSCOPY WITH PROPOFOL;  Surgeon: Tressia Danas, MD;  Location: [facility] ENDOSCOPY;  Service: Gastroenterology;  Laterality: N/A;   DENTAL SURGERY     all teeth  removed   FLEXIBLE SIGMOIDOSCOPY N/A 11/21/2022   Procedure: FLEXIBLE SIGMOIDOSCOPY;  Surgeon: Jeani Hawking, MD;  Location: [facility] ENDOSCOPY;  Service: Gastroenterology;  Laterality: N/A;   HEMOSTASIS CONTROL  11/21/2022   Procedure: HEMOSTASIS CONTROL;  Surgeon: Jeani Hawking, MD;  Location: [facility] ENDOSCOPY;  Service: Gastroenterology;;  PuraStat   XI ROBOT ASSISTED TRANSANAL RESECTION N/A 09/25/2020   Procedure: TAMIS PARTIAL PROCTECTOMY OF RECTAL MASS;  Surgeon: Karie Soda, MD;  Location: [facility] ORS;  Service: General;  Laterality: N/A;   Family History  Problem Relation Age of Onset   Other Mother        unknown medical history   Other Father        unknown medical history   Seizures Daughter    Diabetes Son    Colon polyps Neg Hx    Colon cancer Neg Hx    Esophageal cancer Neg Hx    Rectal cancer Neg Hx    Stomach cancer Neg Hx    Social History   Socioeconomic History   Marital status: Married    Spouse name: Corene   Number of children: 4   Years of education: 7   Highest education level: Not on file  Occupational History   Occupation: retired    Comment: farmer  Tobacco Use   Smoking status: Former    Current packs/day: 0.00    Average packs/day: 1 pack/day for 29.0 years (29.0 ttl pk-yrs)    Types: Cigarettes    Start date: 14    Quit date: 1989    Years since quitting: 35.9   Smokeless tobacco: Current    Types: Chew  Vaping Use   Vaping status: Never Used  Substance and Sexual Activity   Alcohol use: Not Currently    Alcohol/week: 1.0 standard drink of alcohol    Types: 1 Standard drinks or equivalent per week   Drug use: Never   Sexual activity: Yes  Other Topics Concern   Not on file  Social History Narrative   Married, 4 children living in [place] and [place]   Social Drivers of Health   Financial Resource Strain: Low Risk  (10/21/2022)   Overall Financial Resource Strain (CARDIA)    Difficulty of Paying Living Expenses: Not hard at all  Food  Insecurity: No Food Insecurity (11/20/2022)   Hunger Vital Sign    Worried About Running Out of Food in the Last Year: Never true    Ran Out of Food in the Last Year: Never true  Transportation Needs: No Transportation Needs (11/20/2022)   PRAPARE - Administrator, Civil Service (Medical): No    Lack of Transportation (Non-Medical): No  Physical Activity: Sufficiently Active (10/21/2022)   Exercise Vital Sign    Days of Exercise per Week: 5 days    Minutes of Exercise  per Session: 30 min  Stress: No Stress Concern Present (10/21/2022)   [employer] of Occupational Health - Occupational Stress Questionnaire    Feeling of Stress : Not at all  Social Connections: Socially Isolated (10/21/2022)   Social Connection and Isolation Panel [NHANES]    Frequency of Communication with Friends and Family: Never    Frequency of Social Gatherings with Friends and Family: Twice a week    Attends Religious Services: Never    Database administrator or Organizations: No    Attends Banker Meetings: Never    Marital Status: Married  Catering manager Violence: Patient Declined (11/20/2022)   Humiliation, Afraid, Rape, and Kick questionnaire    Fear of Current or Ex-Partner: Patient declined    Emotionally Abused: Patient declined    Physically Abused: Patient declined    Sexually Abused: Patient declined     Physical Exam   Vitals:   02/11/23 1748 02/11/23 1752 02/11/23 2045 02/11/23 2307  BP: 133/85  138/79   Pulse: 88  88   Resp: 16  (!) 23   Temp:    98.8 F (37.1 C)  TempSrc:    Oral  SpO2: 100%  100%   Weight:  67 kg    Height:  5\' 8"  (1.727 m)      Physical Exam Vitals and nursing note reviewed.  Constitutional:      General: He is not in acute distress.    Appearance: Normal appearance. He is well-developed. He is not ill-appearing or toxic-appearing.  HENT:     Head: Normocephalic and atraumatic.     Right Ear: External ear normal.     Left Ear:  External ear normal.     Nose: Nose normal.     Mouth/Throat:     Mouth: Mucous membranes are moist.  Eyes:     Extraocular Movements: Extraocular movements intact.     Pupils: Pupils are equal, round, and reactive to light.  Cardiovascular:     Rate and Rhythm: Normal rate.     Pulses: Normal pulses.  Pulmonary:     Effort: Pulmonary effort is normal. No respiratory distress.     Breath sounds: Normal breath sounds. No stridor. No wheezing, rhonchi or rales.  Abdominal:     Palpations: Abdomen is soft.     Tenderness: There is no abdominal tenderness. There is no right CVA tenderness or left CVA tenderness.  Genitourinary:    Comments: Chaperoned by nursing assistant, melanotic stools, Hemoccult positive Musculoskeletal:        General: Normal range of motion.     Cervical back: Normal range of motion and neck supple.     Comments: Pitting edema up to the knees bilaterally  Skin:    General: Skin is warm and dry.     Capillary Refill: Capillary refill takes less than 2 seconds.  Neurological:     General: No focal deficit present.     Mental Status: He is alert and oriented to person, place, and time. Mental status is at baseline.  Psychiatric:        Mood and Affect: Mood normal.      Procedures   If procedures were preformed on this patient, they are listed below:  Procedures  The patient was seen, evaluated, and treated in conjunction with the attending physician, who voiced agreement in the care provided.  Note generated using Dragon voice dictation software and may contain dictation errors. Please contact me for any clarification or with  any questions.   Electronically signed by:  Osvaldo Shipper, M.D. (PGY-2)    Gunnar Bulla, MD 02/11/23 [id number]    Durwin Glaze, MD 02/11/23 [phone]

## 2023-02-11 NOTE — ED Notes (Addendum)
Attempted to call lab regarding results for CBC with diff and the second troponin. No answer. EDP A. Tee MD notified.

## 2023-02-11 NOTE — ED Notes (Signed)
Second set of labs sent

## 2023-02-11 NOTE — ED Notes (Addendum)
Tro called 529 charge nurse notified

## 2023-02-11 NOTE — H&P (Incomplete)
History and Physical      Yeison Gago ZYS:[id number] DOB: 1941-01-09 DOA: 02/11/2023; DOS: 02/11/2023  PCP: Donita Brooks, MD *** Patient coming from: home ***  I have personally briefly reviewed patient's old medical records in [facility]  Chief Complaint: ***  HPI: Alexia Quant is a 82 y.o. male with medical history significant for *** who is admitted to [facility] on 02/11/2023 with *** after presenting from home*** to [facility] ED complaining of ***.    ***       ***   ED Course:  Vital signs in the ED were notable for the following: ***  Labs were notable for the following: ***  Per my interpretation, EKG in ED demonstrated the following:  ***  Imaging in the ED, per corresponding formal radiology read, was notable for the following:  ***  While in the ED, the following were administered: ***  Subsequently, the patient was admitted  ***  ***red    Review of Systems: As per HPI otherwise 10 point review of systems negative.   Past Medical History:  Diagnosis Date   Acute blood loss anemia    on meds   Blood transfusion without reported diagnosis 2022   Cataract    bilateral sx   Controlled diabetes mellitus type 2 with complications (HCC)    Diabetes mellitus without complication (HCC)    not on meds at this time   GERD (gastroesophageal reflux disease)    hx of   GI bleed    Hyperlipidemia    on meds   Hypertension    on meds   Meningitis 1960/1970   Pneumonia 1960/1970   Rectal polyp     Past Surgical History:  Procedure Laterality Date   BIOPSY  07/08/2020   Procedure: BIOPSY;  Surgeon: Tressia Danas, MD;  Location: [facility] ENDOSCOPY;  Service: Gastroenterology;;   COLONOSCOPY WITH PROPOFOL N/A 07/08/2020   Procedure: COLONOSCOPY WITH PROPOFOL;  Surgeon: Tressia Danas, MD;  Location: [facility] ENDOSCOPY;  Service: Gastroenterology;  Laterality: N/A;   DENTAL SURGERY     all teeth removed   FLEXIBLE SIGMOIDOSCOPY N/A 11/21/2022    Procedure: FLEXIBLE SIGMOIDOSCOPY;  Surgeon: Jeani Hawking, MD;  Location: [facility] ENDOSCOPY;  Service: Gastroenterology;  Laterality: N/A;   HEMOSTASIS CONTROL  11/21/2022   Procedure: HEMOSTASIS CONTROL;  Surgeon: Jeani Hawking, MD;  Location: [facility] ENDOSCOPY;  Service: Gastroenterology;;  PuraStat   XI ROBOT ASSISTED TRANSANAL RESECTION N/A 09/25/2020   Procedure: TAMIS PARTIAL PROCTECTOMY OF RECTAL MASS;  Surgeon: Karie Soda, MD;  Location: [facility] ORS;  Service: General;  Laterality: N/A;    Social History:  reports that he quit smoking about 35 years ago. His smoking use included cigarettes. He started smoking about 65 years ago. He has a 29 pack-year smoking history. His smokeless tobacco use includes chew. He reports that he does not currently use alcohol after a past usage of about 1.0 standard drink of alcohol per week. He reports that he does not use drugs.   Allergies  Allergen Reactions   Glucophage [Metformin] Diarrhea    Family History  Problem Relation Age of Onset   Other Mother        unknown medical history   Other Father        unknown medical history   Seizures Daughter    Diabetes Son    Colon polyps Neg Hx    Colon cancer Neg Hx    Esophageal cancer Neg Hx    Rectal cancer  Neg Hx    Stomach cancer Neg Hx     Family history reviewed and not pertinent ***   Prior to Admission medications   Medication Sig Start Date End Date Taking? Authorizing Provider  acetaminophen (TYLENOL) 500 MG tablet Take 500 mg by mouth 2 (two) times daily as needed for moderate pain, headache or fever.   Yes [provider]  latanoprost (XALATAN) 0.005 % ophthalmic solution Place 1 drop into both eyes at bedtime. 06/13/17  Yes [provider]  lisinopril (ZESTRIL) 40 MG tablet TAKE 1 TABLET BY MOUTH EVERY DAY 08/30/22  Yes Donita Brooks, MD  rosuvastatin (CRESTOR) 5 MG tablet TAKE 1 TABLET BY MOUTH ONCE A WEEK FOR CHOLESTEROL Patient taking differently: Take 5 mg by mouth  every Monday. 05/17/22  Yes Donita Brooks, MD  azithromycin (ZITHROMAX) 250 MG tablet Take 1 tablet (250 mg total) by mouth daily. Take first 2 tablets together, then 1 every day until finished. 02/11/23   Garrison, [place] N, FNP  fluticasone [facility]) 50 MCG/ACT nasal spray Place 2 sprays into both nostrils daily. Patient not taking: Reported on 02/11/2023 12/06/22   Donita Brooks, MD  potassium chloride (KLOR-CON M) 10 MEQ tablet Take 1 tablet (10 mEq total) by mouth 2 (two) times daily. Patient not taking: Reported on 02/11/2023 12/09/22   Donita Brooks, MD     Objective    Physical Exam: Vitals:   02/11/23 1748 02/11/23 1752 02/11/23 2045 02/11/23 2307  BP: 133/85  138/79   Pulse: 88  88   Resp: 16  (!) 23   Temp:    98.8 F (37.1 C)  TempSrc:    Oral  SpO2: 100%  100%   Weight:  67 kg    Height:  5\' 8"  (1.727 m)      General: appears to be stated age; alert, oriented Skin: warm, dry, no rash Head:  AT/[place] Mouth:  Oral mucosa membranes appear moist, normal dentition Neck: supple; trachea midline Heart:  RRR; did not appreciate any M/R/G Lungs: CTAB, did not appreciate any wheezes, rales, or rhonchi Abdomen: + BS; soft, ND, NT Vascular: 2+ pedal pulses b/l; 2+ radial pulses b/l Extremities: no peripheral edema, no muscle wasting Neuro: strength and sensation intact in upper and lower extremities b/l ***   *** Neuro: 5/5 strength of the proximal and distal flexors and extensors of the upper and lower extremities bilaterally; sensation intact in upper and lower extremities b/l; cranial nerves II through XII grossly intact; no pronator drift; no evidence suggestive of slurred speech, dysarthria, or facial droop; Normal muscle tone. No tremors.  *** Neuro: In the setting of the patient's current mental status and associated inability to follow instructions, unable to perform full neurologic exam at this time.  As such, assessment of strength, sensation, and  cranial nerves is limited at this time. Patient noted to spontaneously move all 4 extremities. No tremors.  ***    Labs on Admission: I have personally reviewed following labs and imaging studies  CBC: Recent Labs  Lab 02/11/23 1137 02/11/23 2231  WBC 12.3* 8.3  NEUTROABS 9.7* 7.9*  HGB 7.6* 7.4*  HCT 25.4* 24.3*  MCV 94.1 92.4  PLT 362 370   Basic Metabolic Panel: Recent Labs  Lab 02/11/23 1137 02/11/23 1821  NA 143 139  K 3.5 3.4*  CL 105 102  CO2 27 24  GLUCOSE 111* 182*  BUN 9 10  CREATININE 1.03 0.91  CALCIUM 8.9 9.0  MG  --  2.2   GFR: Estimated Creatinine Clearance: 60.3 mL/min (by C-G formula based on SCr of 0.91 mg/dL). Liver Function Tests: Recent Labs  Lab 02/11/23 1137 02/11/23 1821  AST 29 28  ALT 16 17  ALKPHOS 69 70  BILITOT 0.6 0.5  PROT 6.8 6.6  ALBUMIN 3.3* 3.2*   No results for input(s): "LIPASE", "AMYLASE" in the last 168 hours. No results for input(s): "AMMONIA" in the last 168 hours. Coagulation Profile: Recent Labs  Lab 02/11/23 2231  INR 1.2   Cardiac Enzymes: No results for input(s): "CKTOTAL", "CKMB", "CKMBINDEX", "TROPONINI" in the last 168 hours. BNP (last 3 results) No results for input(s): "PROBNP" in the last 8760 hours. HbA1C: No results for input(s): "HGBA1C" in the last 72 hours. CBG: No results for input(s): "GLUCAP" in the last 168 hours. Lipid Profile: No results for input(s): "CHOL", "HDL", "LDLCALC", "TRIG", "CHOLHDL", "LDLDIRECT" in the last 72 hours. Thyroid Function Tests: No results for input(s): "TSH", "T4TOTAL", "FREET4", "T3FREE", "THYROIDAB" in the last 72 hours. Anemia Panel: No results for input(s): "VITAMINB12", "FOLATE", "FERRITIN", "TIBC", "IRON", "RETICCTPCT" in the last 72 hours. Urine analysis:    Component Value Date/Time   COLORURINE YELLOW 08/15/2020 0237   APPEARANCEUR HAZY (A) 08/15/2020 0237   LABSPEC 1.019 08/15/2020 0237   PHURINE 5.0 08/15/2020 0237   GLUCOSEU 50 (A) 08/15/2020  0237   HGBUR NEGATIVE 08/15/2020 0237   BILIRUBINUR NEGATIVE 08/15/2020 0237   KETONESUR NEGATIVE 08/15/2020 0237   PROTEINUR NEGATIVE 08/15/2020 0237   NITRITE NEGATIVE 08/15/2020 0237   LEUKOCYTESUR NEGATIVE 08/15/2020 0237    Radiological Exams on Admission: CT Angio Chest PE W and/or Wo Contrast Result Date: 02/11/2023 CLINICAL DATA:  Pulmonary embolism (PE) suspected, high prob pe, cancer patient, trop and bnp elevated. Shortness of breath. EXAM: CT ANGIOGRAPHY CHEST WITH CONTRAST TECHNIQUE: Multidetector CT imaging of the chest was performed using the standard protocol during bolus administration of intravenous contrast. Multiplanar CT image reconstructions and MIPs were obtained to evaluate the vascular anatomy. RADIATION DOSE REDUCTION: This exam was performed according to the departmental dose-optimization program which includes automated exposure control, adjustment of the mA and/or kV according to patient size and/or use of iterative reconstruction technique. CONTRAST:  75mL OMNIPAQUE IOHEXOL 350 MG/ML SOLN COMPARISON:  11/20/2022 FINDINGS: Cardiovascular: No filling defects in the pulmonary arteries to suggest pulmonary emboli. Heart is normal size. Aorta is normal caliber. Scattered aortic and coronary artery calcifications. Mediastinum/Nodes: Pretracheal lymph node has a short axis diameter of 9 mm. Right paratracheal lymph node has a short axis diameter of 11 mm. Subcarinal lymph node has a short axis diameter of 13 mm. These have increased slightly since prior study. No axillary or hilar adenopathy. Trachea and esophagus are unremarkable. Thyroid unremarkable. Lungs/Pleura: Moderate bilateral pleural effusions. Nodule in the superior segment of the left lower lobe again noted measuring up to 2 cm, similar to prior study. Moderate centrilobular emphysema. Compressive atelectasis in the lower lobes. Upper Abdomen: Large low-density area partially visualized in the right hepatic lobe,  difficult to visualize due to contrast timing for this CTA chest, but appears proximally 6.7 cm and likely stable since prior study. Musculoskeletal: Chest wall soft tissues are unremarkable. No acute bony abnormality. Review of the MIP images confirms the above findings. IMPRESSION: No evidence of pulmonary embolus. Scattered coronary artery disease. Superior segment left lower lobe pulmonary nodule proximally 2.1 cm, stable since prior study. 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 Moderate  bilateral pleural effusions. Borderline and mildly enlarged mediastinal lymph nodes, slightly larger than prior study. Aortic Atherosclerosis (ICD10-I70.0). Electronically Signed   By: Charlett Nose M.D.   On: 02/11/2023 21:17   DG Chest 2 View Result Date: 02/11/2023 CLINICAL DATA:  Chest pain and shortness of breath EXAM: CHEST - 2 VIEW COMPARISON:  08/15/2020 FINDINGS: Heart size and pulmonary vascularity are normal. Small bilateral pleural effusions. Emphysematous changes and fibrosis in the lungs. No pneumothorax. Mediastinal contours appear intact. Degenerative changes in the spine and shoulders. Calcification of the aorta. IMPRESSION: Emphysematous changes and scattered fibrosis in the lungs. Small bilateral pleural effusions. Electronically Signed   By: Burman Nieves M.D.   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      Chaney Born Eston Heslin DO [facility]  From 7PM - 7AM   02/11/2023, 11:47 PM   ***

## 2023-02-11 NOTE — ED Provider Notes (Signed)
[facility]    CSN: [id number] Arrival date & time: 02/11/23  1016      History   Chief Complaint No chief complaint on file.   HPI Ryan Evetts is a 82 y.o. male.   Patient presents to clinic complaining of bilateral lower extremity edema that has been present since Saturday.  He has also been suffering from ongoing nasal congestion and a dry throat.  He has felt short of breath recently with exertion and has been sleeping propped up on pillows.  He has been using Flonase nasal spray for a while, reports this has not been helping.  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  His family medicine doctor offered to refer him to oncology and he declined.  The lower extremity edema is not painful.  He has not tried compression socks, reports they are too tight.  The history is provided by the patient, medical records and a caregiver.    Past Medical History:  Diagnosis Date   Acute blood loss anemia    on meds   Blood transfusion without reported diagnosis 2022   Cataract    bilateral sx   Controlled diabetes mellitus type 2 with complications (HCC)    Diabetes mellitus without complication (HCC)    not on meds at this time   GERD (gastroesophageal reflux disease)    hx of   GI bleed    Hyperlipidemia    on meds   Hypertension    on meds   Meningitis 1960/1970   Pneumonia 1960/1970   Rectal polyp     Patient Active Problem List   Diagnosis Date Noted   Rectal cancer (HCC) 11/18/2020   Mass in rectum 09/25/2020   GI bleed    Acute blood loss anemia    BPH (benign prostatic hyperplasia) 09/17/2019   Primary open angle glaucoma (POAG) of both eyes 03/19/2019   Allergic rhinitis 09/11/2018   Hyperlipemia 05/29/2017   Hypertension 05/27/2017   Diabetes mellitus without complication (HCC) 05/27/2017    Past Surgical History:  Procedure Laterality Date   BIOPSY  07/08/2020   Procedure: BIOPSY;  Surgeon:  Tressia Danas, MD;  Location: [facility] ENDOSCOPY;  Service: Gastroenterology;;   COLONOSCOPY WITH PROPOFOL N/A 07/08/2020   Procedure: COLONOSCOPY WITH PROPOFOL;  Surgeon: Tressia Danas, MD;  Location: [facility] ENDOSCOPY;  Service: Gastroenterology;  Laterality: N/A;   DENTAL SURGERY     all teeth removed   FLEXIBLE SIGMOIDOSCOPY N/A 11/21/2022   Procedure: FLEXIBLE SIGMOIDOSCOPY;  Surgeon: Jeani Hawking, MD;  Location: [facility] ENDOSCOPY;  Service: Gastroenterology;  Laterality: N/A;   HEMOSTASIS CONTROL  11/21/2022   Procedure: HEMOSTASIS CONTROL;  Surgeon: Jeani Hawking, MD;  Location: [facility] ENDOSCOPY;  Service: Gastroenterology;;  PuraStat   XI ROBOT ASSISTED TRANSANAL RESECTION N/A 09/25/2020   Procedure: TAMIS PARTIAL PROCTECTOMY OF RECTAL MASS;  Surgeon: Karie Soda, MD;  Location: [facility] ORS;  Service: General;  Laterality: N/A;       Home Medications    Prior to Admission medications   Medication Sig Start Date End Date Taking? Authorizing Provider  azithromycin (ZITHROMAX) 250 MG tablet Take 1 tablet (250 mg total) by mouth daily. Take first 2 tablets together, then 1 every day until finished. 02/11/23  Yes Rinaldo Ratel, [place], FNP  acetaminophen (TYLENOL) 500 MG tablet Take 500 mg by mouth 2 (two) times daily as needed for moderate pain, headache or fever.    [provider]  fluticasone (FLONASE) 50 MCG/ACT  nasal spray Place 2 sprays into both nostrils daily. 12/06/22   Donita Brooks, MD  latanoprost (XALATAN) 0.005 % ophthalmic solution Place 1 drop into both eyes at bedtime. 06/13/17   [provider]  lisinopril (ZESTRIL) 40 MG tablet TAKE 1 TABLET BY MOUTH EVERY DAY Patient not taking: Reported on 12/06/2022 08/30/22   Donita Brooks, MD  potassium chloride (KLOR-CON M) 10 MEQ tablet Take 1 tablet (10 mEq total) by mouth 2 (two) times daily. 12/09/22   Donita Brooks, MD  potassium chloride SA (KLOR-CON M) 20 MEQ tablet Take 2 tablets (40 mEq total) by mouth daily for  2 days. 11/22/22 11/24/22  Narda Bonds, MD  rosuvastatin (CRESTOR) 5 MG tablet TAKE 1 TABLET BY MOUTH ONCE A WEEK FOR CHOLESTEROL Patient taking differently: Take 5 mg by mouth every Monday. 05/17/22   Donita Brooks, MD    Family History Family History  Problem Relation Age of Onset   Other Mother        unknown medical history   Other Father        unknown medical history   Seizures Daughter    Diabetes Son    Colon polyps Neg Hx    Colon cancer Neg Hx    Esophageal cancer Neg Hx    Rectal cancer Neg Hx    Stomach cancer Neg Hx     Social History Social History   Tobacco Use   Smoking status: Former    Current packs/day: 0.00    Average packs/day: 1 pack/day for 29.0 years (29.0 ttl pk-yrs)    Types: Cigarettes    Start date: 81    Quit date: 1989    Years since quitting: 35.9   Smokeless tobacco: Current    Types: Chew  Vaping Use   Vaping status: Never Used  Substance Use Topics   Alcohol use: Not Currently    Alcohol/week: 1.0 standard drink of alcohol    Types: 1 Standard drinks or equivalent per week   Drug use: Never     Allergies   Glucophage [metformin]   Review of Systems Review of Systems  Per HPI   Physical Exam Triage Vital Signs ED Triage Vitals  Encounter Vitals Group     BP 02/11/23 1120 127/74     Systolic BP Percentile --      Diastolic BP Percentile --      Pulse Rate 02/11/23 1120 89     Resp 02/11/23 1120 16     Temp 02/11/23 1120 98 F (36.7 C)     Temp Source 02/11/23 1120 Oral     SpO2 02/11/23 1120 96 %     Weight --      Height --      Head Circumference --      Peak Flow --      Pain Score 02/11/23 1119 4     Pain Loc --      Pain Education --      Exclude from Growth Chart --    No data found.  Updated Vital Signs BP 127/74 (BP Location: Left Arm)   Pulse 89   Temp 98 F (36.7 C) (Oral)   Resp 16   SpO2 96%   Visual Acuity Right Eye Distance:   Left Eye Distance:   Bilateral Distance:     Right Eye Near:   Left Eye Near:    Bilateral Near:     Physical Exam Vitals and nursing note  reviewed.  Constitutional:      Appearance: Normal appearance.  HENT:     Head: Normocephalic and atraumatic.     Right Ear: External ear normal.     Left Ear: External ear normal.     Nose: Congestion present.     Mouth/Throat:     Mouth: Mucous membranes are moist.  Eyes:     Conjunctiva/sclera: Conjunctivae normal.  Cardiovascular:     Rate and Rhythm: Normal rate and regular rhythm.     Heart sounds: Normal heart sounds. No murmur heard. Pulmonary:     Effort: Pulmonary effort is normal. No respiratory distress.     Breath sounds: Normal breath sounds.  Musculoskeletal:        General: Normal range of motion.     Right lower leg: 3+ Pitting Edema present.     Left lower leg: 3+ Pitting Edema present.  Skin:    General: Skin is warm and dry.  Neurological:     General: No focal deficit present.     Mental Status: He is alert and oriented to person, place, and time.  Psychiatric:        Mood and Affect: Mood normal.        Behavior: Behavior normal.      UC Treatments / Results  Labs (all labs ordered are listed, but only abnormal results are displayed) Labs Reviewed  COMPREHENSIVE METABOLIC PANEL  CBC WITH DIFFERENTIAL/PLATELET  BRAIN NATRIURETIC PEPTIDE    EKG   Radiology No results found.  Procedures Procedures (including critical care time)  Medications Ordered in UC Medications  dexamethasone (DECADRON) injection 10 mg (has no administration in time range)    Initial Impression / Assessment and Plan / UC Course  I have reviewed the triage vital signs and the nursing notes.  Pertinent labs & imaging results that were available during my care of the patient were reviewed by me and considered in my medical decision making (see chart for details).  Vitals and triage reviewed, patient is hemodynamically stable.  Metastatic cancer patient, palliative  care.  He is congested on physical exam.  Reports he used to have improvement with antibiotics and a steroid shot.  Symptoms have been persistent for a while, will trial a Z-Pak and a steroid injection for patient comfort.  He does have significant lower extremity edema, 3+ and pitting.  Non- tender.  Without erythema or warmth.  Will check CBC, CMP and BNP.  Some concern over potential heart failure, discussed RBA of partial workup with a week and complete here at the urgent care.  Encouraged follow-up with primary care, does have an appointment scheduled early next week.  Strict emergency precautions given if symptoms evolve for further advanced evaluation such as echocardiogram.  Discussed that medication to help him sleep and not be too restless at night should be addressed by his primary care provider.  Plan of care, follow-up care return precautions given, no questions at this time.     Final Clinical Impressions(s) / UC Diagnoses   Final diagnoses:  Chronic sinusitis, unspecified location  Peripheral edema     Discharge Instructions      For the lower extremity swelling please use compression stockings and elevate your feet when at rest.  We are checking some basic labs and I will contact you if anything is emergent.  Please follow-up with your primary care provider as scheduled.  For your ongoing congestion we are going to trial antibiotics and a steroid shot.  You can consider an over-the-counter saline nasal spray to help moisten your secretions.  If you have any new, concerning symptoms or any worsening of your symptoms do not hesitate to seek further care at the emergency department where they can perform advanced evaluation.      ED Prescriptions     Medication Sig Dispense Auth. Provider   azithromycin (ZITHROMAX) 250 MG tablet Take 1 tablet (250 mg total) by mouth daily. Take first 2 tablets together, then 1 every day until finished. 6 tablet Zyanya Glaza, [place], [place]       PDMP not reviewed this encounter.   Lyndi Holbein, [place] N, [place] 02/11/23 1145

## 2023-02-11 NOTE — ED Triage Notes (Addendum)
Pt presents with bilateral foot swelling for 4 days. He also c/o sore throat, congestion, and difficultly sleeping for more than 4 days. He visited doctor and receive allergy medication and nasal spray which has not helped.

## 2023-02-12 DIAGNOSIS — D649 Anemia, unspecified: Secondary | ICD-10-CM | POA: Diagnosis not present

## 2023-02-12 DIAGNOSIS — E876 Hypokalemia: Secondary | ICD-10-CM | POA: Diagnosis present

## 2023-02-12 DIAGNOSIS — R7989 Other specified abnormal findings of blood chemistry: Secondary | ICD-10-CM | POA: Diagnosis present

## 2023-02-12 DIAGNOSIS — R9431 Abnormal electrocardiogram [ECG] [EKG]: Secondary | ICD-10-CM | POA: Diagnosis present

## 2023-02-12 LAB — CBC WITH DIFFERENTIAL/PLATELET
Abs Immature Granulocytes: 0.05 10*3/uL (ref 0.00–0.07)
Basophils Absolute: 0 10*3/uL (ref 0.0–0.1)
Basophils Relative: 0 %
Eosinophils Absolute: 0 10*3/uL (ref 0.0–0.5)
Eosinophils Relative: 0 %
HCT: 27.7 % — ABNORMAL LOW (ref 39.0–52.0)
Hemoglobin: 8.7 g/dL — ABNORMAL LOW (ref 13.0–17.0)
Immature Granulocytes: 0 %
Lymphocytes Relative: 6 %
Lymphs Abs: 0.7 10*3/uL (ref 0.7–4.0)
MCH: 28.5 pg (ref 26.0–34.0)
MCHC: 31.4 g/dL (ref 30.0–36.0)
MCV: 90.8 fL (ref 80.0–100.0)
Monocytes Absolute: 0.6 10*3/uL (ref 0.1–1.0)
Monocytes Relative: 5 %
Neutro Abs: 10.3 10*3/uL — ABNORMAL HIGH (ref 1.7–7.7)
Neutrophils Relative %: 89 %
Platelets: 349 10*3/uL (ref 150–400)
RBC: 3.05 MIL/uL — ABNORMAL LOW (ref 4.22–5.81)
RDW: 14.6 % (ref 11.5–15.5)
WBC: 11.7 10*3/uL — ABNORMAL HIGH (ref 4.0–10.5)
nRBC: 0 % (ref 0.0–0.2)

## 2023-02-12 LAB — COMPREHENSIVE METABOLIC PANEL
ALT: 17 U/L (ref 0–44)
AST: 42 U/L — ABNORMAL HIGH (ref 15–41)
Albumin: 2.9 g/dL — ABNORMAL LOW (ref 3.5–5.0)
Alkaline Phosphatase: 66 U/L (ref 38–126)
Anion gap: 6 (ref 5–15)
BUN: 10 mg/dL (ref 8–23)
CO2: 25 mmol/L (ref 22–32)
Calcium: 8.4 mg/dL — ABNORMAL LOW (ref 8.9–10.3)
Chloride: 107 mmol/L (ref 98–111)
Creatinine, Ser: 1.02 mg/dL (ref 0.61–1.24)
GFR, Estimated: >60 mL/min (ref >60)
Glucose, Bld: 154 mg/dL — ABNORMAL HIGH (ref 70–99)
Potassium: 4.2 mmol/L (ref 3.5–5.1)
Sodium: 138 mmol/L (ref 135–145)
Total Bilirubin: 1.3 mg/dL — ABNORMAL HIGH (ref <1.2)
Total Protein: 6 g/dL — ABNORMAL LOW (ref 6.5–8.1)

## 2023-02-12 LAB — GLUCOSE, CAPILLARY
Glucose-Capillary: 115 mg/dL — ABNORMAL HIGH (ref 70–99)
Glucose-Capillary: 118 mg/dL — ABNORMAL HIGH (ref 70–99)
Glucose-Capillary: 123 mg/dL — ABNORMAL HIGH (ref 70–99)

## 2023-02-12 LAB — IRON AND TIBC
Iron: 18 ug/dL — ABNORMAL LOW (ref 45–182)
Saturation Ratios: 5 % — ABNORMAL LOW (ref 17.9–39.5)
TIBC: 335 ug/dL (ref 250–450)
UIBC: 317 ug/dL

## 2023-02-12 LAB — HEMOGLOBIN AND HEMATOCRIT, BLOOD
HCT: 30.3 % — ABNORMAL LOW (ref 39.0–52.0)
Hemoglobin: 9.2 g/dL — ABNORMAL LOW (ref 13.0–17.0)

## 2023-02-12 LAB — CBG MONITORING, ED
Glucose-Capillary: 133 mg/dL — ABNORMAL HIGH (ref 70–99)
Glucose-Capillary: 110 mg/dL — ABNORMAL HIGH (ref 70–99)

## 2023-02-12 LAB — FERRITIN: Ferritin: 15 ng/mL — ABNORMAL LOW (ref 24–336)

## 2023-02-12 LAB — MAGNESIUM: Magnesium: 2.3 mg/dL (ref 1.7–2.4)

## 2023-02-12 LAB — MRSA NEXT GEN BY PCR, NASAL: MRSA by PCR Next Gen: DETECTED — AB

## 2023-02-12 MED ORDER — ROSUVASTATIN CALCIUM 5 MG PO TABS
5.0000 mg | ORAL_TABLET | ORAL | Status: DC
  Administered 2023-02-14: 5 mg via ORAL
  Filled 2023-02-12: qty 1

## 2023-02-12 MED ORDER — POTASSIUM CHLORIDE 20 MEQ PO PACK
40.0000 meq | PACK | Freq: Once | ORAL | Status: AC
  Administered 2023-02-12: 40 meq via ORAL
  Filled 2023-02-12: qty 2

## 2023-02-12 MED ORDER — INSULIN ASPART 100 UNIT/ML IJ SOLN
0.0000 [IU] | Freq: Four times a day (QID) | INTRAMUSCULAR | Status: DC
Start: 2023-02-12 — End: 2023-02-14

## 2023-02-12 MED ORDER — LATANOPROST 0.005 % OP SOLN
1.0000 [drp] | Freq: Every day | OPHTHALMIC | Status: DC
  Administered 2023-02-12 – 2023-02-13 (×2): 1 [drp] via OPHTHALMIC
  Filled 2023-02-12: qty 2.5

## 2023-02-12 NOTE — ED Notes (Signed)
Upon entering pt's room, he was found to be standing, with feces all over the floor.  He stated he was just left in the room all night without a change.  Night shift RN reported she attempted to change him and he became agitated and stated that he knew when he had to use the bathroom. Pt able to states correct month and year.  Able to ambulate without difficulty around the room.

## 2023-02-12 NOTE — ED Notes (Addendum)
ED TO INPATIENT HANDOFF REPORT  ED Nurse Name and Phone #:  Yancey Flemings [phone]  S Name/Age/Gender Ryan Mosley 82 y.o. male Room/Bed: 001C/001C  Code Status   Code Status: Full Code  Home/SNF/Other  Patient oriented to: self, place, time, and situation Is this baseline? Yes   Triage Complete: Triage complete  Chief Complaint Acute on chronic anemia [D64.9]  Triage Note The  pt reports sob for one week with both feet and legs swollen he was seen at [facility] earlier today and he was called at home and told to come back in due to fluid around his heart   Allergies Allergies  Allergen Reactions   Glucophage [Metformin] Diarrhea    Level of Care/Admitting Diagnosis ED Disposition     ED Disposition  Admit   Condition  --   Comment  Hospital Area: [facility] [[id number]]  Level of Care: Progressive [102]  Admit to Progressive based on following criteria: MULTISYSTEM THREATS such as stable sepsis, metabolic/electrolyte imbalance with or without encephalopathy that is responding to early treatment.  May admit patient to [facility] or [facility] if equivalent level of care is available:: No  Covid Evaluation: Asymptomatic - no recent exposure (last 10 days) testing not required  Diagnosis: Acute on chronic anemia [[id number]]  Admitting Physician: Angie Fava [[id number]]  Attending Physician: Angie Fava [[id number]]  Certification:: I certify this patient will need inpatient services for at least 2 midnights  Expected Medical Readiness: 02/13/2023          B Medical/Surgery History Past Medical History:  Diagnosis Date   Acute blood loss anemia    on meds   Blood transfusion without reported diagnosis 2022   Cataract    bilateral sx   Controlled diabetes mellitus type 2 with complications (HCC)    Diabetes mellitus without complication (HCC)    not on meds at this time   GERD (gastroesophageal reflux disease)    hx of   GI bleed     Hyperlipidemia    on meds   Hypertension    on meds   Meningitis 1960/1970   Pneumonia 1960/1970   Rectal polyp    Past Surgical History:  Procedure Laterality Date   BIOPSY  07/08/2020   Procedure: BIOPSY;  Surgeon: Tressia Danas, MD;  Location: [facility] ENDOSCOPY;  Service: Gastroenterology;;   COLONOSCOPY WITH PROPOFOL N/A 07/08/2020   Procedure: COLONOSCOPY WITH PROPOFOL;  Surgeon: Tressia Danas, MD;  Location: [facility] ENDOSCOPY;  Service: Gastroenterology;  Laterality: N/A;   DENTAL SURGERY     all teeth removed   FLEXIBLE SIGMOIDOSCOPY N/A 11/21/2022   Procedure: FLEXIBLE SIGMOIDOSCOPY;  Surgeon: Jeani Hawking, MD;  Location: [facility] ENDOSCOPY;  Service: Gastroenterology;  Laterality: N/A;   HEMOSTASIS CONTROL  11/21/2022   Procedure: HEMOSTASIS CONTROL;  Surgeon: Jeani Hawking, MD;  Location: [facility] ENDOSCOPY;  Service: Gastroenterology;;  PuraStat   XI ROBOT ASSISTED TRANSANAL RESECTION N/A 09/25/2020   Procedure: TAMIS PARTIAL PROCTECTOMY OF RECTAL MASS;  Surgeon: Karie Soda, MD;  Location: [facility] ORS;  Service: General;  Laterality: N/A;     A IV Location/Drains/Wounds Patient Lines/Drains/Airways Status     Active Line/Drains/Airways     Name Placement date Placement time Site Days   Peripheral IV 02/11/23 20 G 1" Left Antecubital 02/11/23  2012  Antecubital  1            Intake/Output Last 24 hours No intake or output data in the 24 hours ending 02/12/23 1117  Labs/Imaging  Results for orders placed or performed during the hospital encounter of 02/11/23 (from the past 48 hours)  Troponin I (High Sensitivity)     Status: Abnormal   Collection Time: 02/11/23  6:21 PM  Result Value Ref Range   Troponin I (High Sensitivity) 529 (HH) <18 ng/L    Comment: CRITICAL RESULT CALLED TO, READ BACK BY AND VERIFIED WITH C,CHRISCOE RN @1918  02/11/23 E,BENTON (NOTE) Elevated high sensitivity troponin I (hsTnI) values and significant  changes across serial measurements may suggest ACS but  many other  chronic and acute conditions are known to elevate hsTnI results.  Refer to the "Links" section for chest pain algorithms and additional  guidance. Performed at [facility], [address], [place], [place] [address]   Comprehensive metabolic panel     Status: Abnormal   Collection Time: 02/11/23  6:21 PM  Result Value Ref Range   Sodium 139 135 - 145 mmol/L   Potassium 3.4 (L) 3.5 - 5.1 mmol/L   Chloride 102 98 - 111 mmol/L   CO2 24 22 - 32 mmol/L   Glucose, Bld 182 (H) 70 - 99 mg/dL    Comment: Glucose reference range applies only to samples taken after fasting for at least 8 hours.   BUN 10 8 - 23 mg/dL   Creatinine, Ser 6.96 0.61 - 1.24 mg/dL   Calcium 9.0 8.9 - 29.5 mg/dL   Total Protein 6.6 6.5 - 8.1 g/dL   Albumin 3.2 (L) 3.5 - 5.0 g/dL   AST 28 15 - 41 U/L   ALT 17 0 - 44 U/L   Alkaline Phosphatase 70 38 - 126 U/L   Total Bilirubin 0.5 <1.2 mg/dL   GFR, Estimated >28 >41 mL/min    Comment: (NOTE) Calculated using the CKD-EPI Creatinine Equation (2021)    Anion gap 13 5 - 15    Comment: Performed at [facility], [address] [place], [place] [address]  Magnesium     Status: None   Collection Time: 02/11/23  6:21 PM  Result Value Ref Range   Magnesium 2.2 1.7 - 2.4 mg/dL    Comment: Performed at [facility], [address], [place], [place] [address]  Ferritin     Status: Abnormal   Collection Time: 02/11/23  7:48 PM  Result Value Ref Range   Ferritin 15 (L) 24 - 336 ng/mL    Comment: Performed at [facility], [address] [place], [place] [address]  Iron and TIBC     Status: Abnormal   Collection Time: 02/11/23  7:48 PM  Result Value Ref Range   Iron 18 (L) 45 - 182 ug/dL   TIBC 403 474 - 259 ug/dL   Saturation Ratios 5 (L) 17.9 - 39.5 %   UIBC 317 ug/dL    Comment: Performed at [facility], [address], [place], [place] [address]  Troponin I (High Sensitivity)     Status: Abnormal   Collection Time:  02/11/23 10:31 PM  Result Value Ref Range   Troponin I (High Sensitivity) 510 (HH) <18 ng/L    Comment: CRITICAL VALUE NOTED. VALUE IS CONSISTENT WITH PREVIOUSLY REPORTED/CALLED VALUE (NOTE) Elevated high sensitivity troponin I (hsTnI) values and significant  changes across serial measurements may suggest ACS but many other  chronic and acute conditions are known to elevate hsTnI results.  Refer to the "Links" section for chest pain algorithms and additional  guidance. Performed at [facility]  Lab, [address] [place], [place] [address]   Type and screen [facility]     Status: None (Preliminary result)   Collection Time: 02/11/23 10:31 PM  Result Value Ref Range   ABO/RH(D) A POS    Antibody Screen NEG    Sample Expiration 02/14/2023,2359    Unit Number [id number]    Blood Component Type RED CELLS,LR    Unit division 00    Status of Unit ISSUED    Transfusion Status OK TO TRANSFUSE    Crossmatch Result      Compatible Performed at [facility], [address], [place], [place] [address]   CBC with Differential/Platelet     Status: Abnormal   Collection Time: 02/11/23 10:31 PM  Result Value Ref Range   WBC 8.3 4.0 - 10.5 K/uL   RBC 2.63 (L) 4.22 - 5.81 MIL/uL   Hemoglobin 7.4 (L) 13.0 - 17.0 g/dL   HCT 47.4 (L) 25.9 - 56.3 %   MCV 92.4 80.0 - 100.0 fL   MCH 28.1 26.0 - 34.0 pg   MCHC 30.5 30.0 - 36.0 g/dL   RDW 87.5 64.3 - 32.9 %   Platelets 370 150 - 400 K/uL   nRBC 0.0 0.0 - 0.2 %   Neutrophils Relative % 94 %   Neutro Abs 7.9 (H) 1.7 - 7.7 K/uL   Lymphocytes Relative 4 %   Lymphs Abs 0.4 (L) 0.7 - 4.0 K/uL   Monocytes Relative 1 %   Monocytes Absolute 0.1 0.1 - 1.0 K/uL   Eosinophils Relative 0 %   Eosinophils Absolute 0.0 0.0 - 0.5 K/uL   Basophils Relative 0 %   Basophils Absolute 0.0 0.0 - 0.1 K/uL   Immature Granulocytes 1 %   Abs Immature Granulocytes 0.05 0.00 - 0.07 K/uL    Comment: Performed at [facility],  [address] [place], [place] [address]  Protime-INR     Status: Abnormal   Collection Time: 02/11/23 10:31 PM  Result Value Ref Range   Prothrombin Time 15.3 (H) 11.4 - 15.2 seconds   INR 1.2 0.8 - 1.2    Comment: (NOTE) INR goal varies based on device and disease states. Performed at [facility], [address], [place], [place] [address]   APTT     Status: None   Collection Time: 02/11/23 10:31 PM  Result Value Ref Range   aPTT 27 24 - 36 seconds    Comment: Performed at [facility], [address], [place], [place] [address]  Brain natriuretic peptide     Status: Abnormal   Collection Time: 02/11/23 10:45 PM  Result Value Ref Range   B Natriuretic Peptide 812.7 (H) 0.0 - 100.0 pg/mL    Comment: Performed at [facility], [address] [place], [place] [address]  Prepare RBC (crossmatch)     Status: None   Collection Time: 02/11/23 11:59 PM  Result Value Ref Range   Order Confirmation      ORDER PROCESSED BY BLOOD BANK Performed at [facility], [address], [place], [place] [address]   CBC with Differential/Platelet     Status: Abnormal   Collection Time: 02/12/23  4:10 AM  Result Value Ref Range   WBC 11.7 (H) 4.0 - 10.5 K/uL   RBC 3.05 (L) 4.22 - 5.81 MIL/uL   Hemoglobin 8.7 (L) 13.0 - 17.0 g/dL   HCT 22.0 (L) 25.4 - 27.0 %  MCV 90.8 80.0 - 100.0 fL   MCH 28.5 26.0 - 34.0 pg   MCHC 31.4 30.0 - 36.0 g/dL   RDW 16.1 09.6 - 04.5 %   Platelets 349 150 - 400 K/uL   nRBC 0.0 0.0 - 0.2 %   Neutrophils Relative % 89 %   Neutro Abs 10.3 (H) 1.7 - 7.7 K/uL   Lymphocytes Relative 6 %   Lymphs Abs 0.7 0.7 - 4.0 K/uL   Monocytes Relative 5 %   Monocytes Absolute 0.6 0.1 - 1.0 K/uL   Eosinophils Relative 0 %   Eosinophils Absolute 0.0 0.0 - 0.5 K/uL   Basophils Relative 0 %   Basophils Absolute 0.0 0.0 - 0.1 K/uL   Immature Granulocytes 0 %   Abs Immature Granulocytes 0.05 0.00 - 0.07 K/uL    Comment: Performed at [facility],  [address], [place], [place] [address]  Comprehensive metabolic panel     Status: Abnormal   Collection Time: 02/12/23  4:10 AM  Result Value Ref Range   Sodium 138 135 - 145 mmol/L   Potassium 4.2 3.5 - 5.1 mmol/L    Comment: HEMOLYSIS AT THIS LEVEL MAY AFFECT RESULT DELTA CHECK NOTED    Chloride 107 98 - 111 mmol/L   CO2 25 22 - 32 mmol/L   Glucose, Bld 154 (H) 70 - 99 mg/dL    Comment: Glucose reference range applies only to samples taken after fasting for at least 8 hours.   BUN 10 8 - 23 mg/dL   Creatinine, Ser 1.91 0.61 - 1.24 mg/dL   Calcium 8.4 (L) 8.9 - 10.3 mg/dL   Total Protein 6.0 (L) 6.5 - 8.1 g/dL   Albumin 2.9 (L) 3.5 - 5.0 g/dL   AST 42 (H) 15 - 41 U/L    Comment: HEMOLYSIS AT THIS LEVEL MAY AFFECT RESULT   ALT 17 0 - 44 U/L    Comment: HEMOLYSIS AT THIS LEVEL MAY AFFECT RESULT   Alkaline Phosphatase 66 38 - 126 U/L   Total Bilirubin 1.3 (H) <1.2 mg/dL    Comment: HEMOLYSIS AT THIS LEVEL MAY AFFECT RESULT   GFR, Estimated >60 >60 mL/min    Comment: (NOTE) Calculated using the CKD-EPI Creatinine Equation (2021)    Anion gap 6 5 - 15    Comment: Performed at [facility], [address], [place], [place] [address]  Magnesium     Status: None   Collection Time: 02/12/23  4:10 AM  Result Value Ref Range   Magnesium 2.3 1.7 - 2.4 mg/dL    Comment: Performed at [facility], [address], [place], [place] [address]  CBG monitoring, ED     Status: Abnormal   Collection Time: 02/12/23  6:01 AM  Result Value Ref Range   Glucose-Capillary 133 (H) 70 - 99 mg/dL    Comment: Glucose reference range applies only to samples taken after fasting for at least 8 hours.   CT Angio Chest PE W and/or Wo Contrast Result Date: 02/11/2023 CLINICAL DATA:  Pulmonary embolism (PE) suspected, high prob pe, cancer patient, trop and bnp elevated. Shortness of breath. EXAM: CT ANGIOGRAPHY CHEST WITH CONTRAST TECHNIQUE: Multidetector CT imaging of the chest was performed using  the standard protocol during bolus administration of intravenous contrast. Multiplanar CT image reconstructions and MIPs were obtained to evaluate the vascular anatomy. RADIATION DOSE REDUCTION: This exam was performed according to the departmental dose-optimization program which includes automated exposure control, adjustment  of the mA and/or kV according to patient size and/or use of iterative reconstruction technique. CONTRAST:  75mL OMNIPAQUE IOHEXOL 350 MG/ML SOLN COMPARISON:  11/20/2022 FINDINGS: Cardiovascular: No filling defects in the pulmonary arteries to suggest pulmonary emboli. Heart is normal size. Aorta is normal caliber. Scattered aortic and coronary artery calcifications. Mediastinum/Nodes: Pretracheal lymph node has a short axis diameter of 9 mm. Right paratracheal lymph node has a short axis diameter of 11 mm. Subcarinal lymph node has a short axis diameter of 13 mm. These have increased slightly since prior study. No axillary or hilar adenopathy. Trachea and esophagus are unremarkable. Thyroid unremarkable. Lungs/Pleura: Moderate bilateral pleural effusions. Nodule in the superior segment of the left lower lobe again noted measuring up to 2 cm, similar to prior study. Moderate centrilobular emphysema. Compressive atelectasis in the lower lobes. Upper Abdomen: Large low-density area partially visualized in the right hepatic lobe, difficult to visualize due to contrast timing for this CTA chest, but appears proximally 6.7 cm and likely stable since prior study. Musculoskeletal: Chest wall soft tissues are unremarkable. No acute bony abnormality. Review of the MIP images confirms the above findings. IMPRESSION: No evidence of pulmonary embolus. Scattered coronary artery disease. Superior segment left lower lobe pulmonary nodule proximally 2.1 cm, stable since prior study. 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  Moderate bilateral pleural effusions. Borderline and mildly enlarged mediastinal lymph nodes, slightly larger than prior study. Aortic Atherosclerosis (ICD10-I70.0). Electronically Signed   By: Charlett Nose M.D.   On: 02/11/2023 21:17   DG Chest 2 View Result Date: 02/11/2023 CLINICAL DATA:  Chest pain and shortness of breath EXAM: CHEST - 2 VIEW COMPARISON:  08/15/2020 FINDINGS: Heart size and pulmonary vascularity are normal. Small bilateral pleural effusions. Emphysematous changes and fibrosis in the lungs. No pneumothorax. Mediastinal contours appear intact. Degenerative changes in the spine and shoulders. Calcification of the aorta. IMPRESSION: Emphysematous changes and scattered fibrosis in the lungs. Small bilateral pleural effusions. Electronically Signed   By: Burman Nieves M.D.   On: 02/11/2023 20:11    Pending Labs Unresulted Labs (From admission, onward)     Start     Ordered   02/12/23 0900  Hemoglobin and hematocrit, blood  Once-Timed,   TIMED        02/11/23 2353   02/11/23 1948  CBC with Differential  Once,   STAT        02/11/23 1948            Vitals/Pain Today's Vitals   02/12/23 0240 02/12/23 0545 02/12/23 0626 02/12/23 0917  BP: (!) 141/83 (!) 146/93  (!) 141/86  Pulse: 96 88  87  Resp: (!) 30 (!) 29  (!) 21  Temp: 98.3 F (36.8 C)  98.3 F (36.8 C) 97.6 F (36.4 C)  TempSrc: Oral  Oral Axillary  SpO2: 96% 97%  96%  Weight:      Height:      PainSc:        Isolation Precautions No active isolations  Medications Medications  0.9 %  sodium chloride infusion (Manually program via Guardrails IV Fluids) ( Intravenous Not Given 02/12/23 0523)  acetaminophen (TYLENOL) tablet 650 mg (has no administration in time range)    Or  acetaminophen (TYLENOL) suppository 650 mg (has no administration in time range)  melatonin tablet 3 mg (has no administration in time range)  ondansetron (ZOFRAN) injection 4 mg (has no administration in time range)  insulin  aspart (novoLOG) injection 0-6 Units ( Subcutaneous Not Given 02/12/23 0617)  latanoprost (XALATAN) 0.005 % ophthalmic solution 1 drop (1 drop Both Eyes Not Given 02/12/23 0522)  rosuvastatin (CRESTOR) tablet 5 mg (has no administration in time range)  iohexol (OMNIPAQUE) 350 MG/ML injection 75 mL (75 mLs Intravenous Contrast Given 02/11/23 2101)  potassium chloride (KLOR-CON) packet 40 mEq (40 mEq Oral Given 02/12/23 0105)    Mobility walks with device     Focused Assessments Cardiac Assessment Handoff:  Cardiac Rhythm: Normal sinus rhythm No results found for: "CKTOTAL", "CKMB", "CKMBINDEX", "TROPONINI" No results found for: "DDIMER" Does the Patient currently have chest pain? No    R Recommendations: See Admitting Provider Note  Report given to:   Additional Notes:

## 2023-02-12 NOTE — Progress Notes (Signed)
PROGRESS NOTE    Ryan Mosley  OHY:[id number] DOB: [birth year] DOA: 02/11/2023 PCP: Donita Brooks, MD  Outpatient Specialists:     Brief Narrative:  Patient is an 82 year old male with past medical history significant for metastatic rectal adenocarcinoma, chronic blood loss anemia, diabetes mellitus type 2, and hypertension.  Baseline hemoglobin is documented at 11 to 12 g/dL.  Patient was admitted with symptomatic anemia.  Apparently, patient has been having rectal bleed.  On presentation, patient's hemoglobin was 7.4 g/dL.  GI team has been consulted to see the patient.  Labs reviewed: Chemistry revealed sodium of 138, potassium of 4.3, CO2 25, BUN of 10 with serum creatinine of 1.02.  Albumin of 2.9, AST of 42, ALT of 17, total protein of 6, total bili of 1.3.  Hemoglobin done earlier today was 8.7 g/dL.  Chest x-ray revealed small bilateral pleural effusion.  CT chest revealed moderate bilateral pleural effusion.   Assessment & Plan:   Principal Problem:   Acute on chronic anemia Active Problems:   Essential hypertension   DM2 (diabetes mellitus, type 2) (HCC)   HLD (hyperlipidemia)   Acute lower GI bleeding   Elevated troponin   Hypokalemia   Prolonged QT interval   Symptomatic anemia/acute on chronic blood loss anemia: -Hemoglobin has improved to 8.7 g/dL. -History of rectal cancer, with associated rectal bleed. -Patient is known to the GI team. -Awaiting GI input. -Continue to monitor H/H.  Elevated troponin: -Troponin of 529. -Likely type II MI. -No chest pain reported.  Hypokalemia: -Resolved. -Potassium is 4.3 today. -Continue to monitor renal function and electrolytes.  QTc prolongation: -QTc of 513 ms. -Likely secondary to hypokalemia. -Hypokalemia has resolved. -Repeat EKG. -Avoid QTc prolonging medications.  Type 2 diabetes mellitus: -Last visualized A1c of 6.4%. -Continue to optimize.  Hypertension: -Monitor closely. -Goal blood pressure  should be less than 130/80 mmHg.  Hyperlipidemia: -Continue Crestor.   DVT prophylaxis: SCD. Code Status: Full code. Family Communication: Wife and daughter. Disposition Plan: To depend on hospital course.   Consultants:  GI  Procedures:  None  Antimicrobials:  None   Subjective: No new complaints  Objective: Vitals:   02/12/23 1311 02/12/23 1400 02/12/23 1744 02/12/23 1800  BP:  (!) 142/77 (!) 157/85   Pulse:  63    Resp:  20 (!) 24 17  Temp: 97.9 F (36.6 C)  98.6 F (37 C)   TempSrc: Oral  Oral   SpO2:  100%  100%  Weight:      Height:       No intake or output data in the 24 hours ending 02/12/23 1824 Filed Weights   02/11/23 1752  Weight: 67 kg    Examination:  General exam: Patient is thin.  Not in any distress.  Awake and alert.   Respiratory system: Clear to auscultation.  Cardiovascular system: S1 & S2  Gastrointestinal system: Abdomen is nondistended, soft and nontender.  Central nervous system: Alert and oriented. No focal neurological deficits. Extremities: Bilateral lower extremity edema.  Data Reviewed: I have personally reviewed following labs and imaging studies  CBC: Recent Labs  Lab 02/11/23 1137 02/11/23 2231 02/12/23 0410  WBC 12.3* 8.3 11.7*  NEUTROABS 9.7* 7.9* 10.3*  HGB 7.6* 7.4* 8.7*  HCT 25.4* 24.3* 27.7*  MCV 94.1 92.4 90.8  PLT 362 370 349   Basic Metabolic Panel: Recent Labs  Lab 02/11/23 1137 02/11/23 1821 02/12/23 0410  NA 143 139 138  K 3.5 3.4* 4.2  CL 105 102 107  CO2 27 24 25   GLUCOSE 111* 182* 154*  BUN 9 10 10   CREATININE 1.03 0.91 1.02  CALCIUM 8.9 9.0 8.4*  MG  --  2.2 2.3   GFR: Estimated Creatinine Clearance: 53.8 mL/min (by C-G formula based on SCr of 1.02 mg/dL). Liver Function Tests: Recent Labs  Lab 02/11/23 1137 02/11/23 1821 02/12/23 0410  AST 29 28 42*  ALT 16 17 17   ALKPHOS 69 70 66  BILITOT 0.6 0.5 1.3*  PROT 6.8 6.6 6.0*  ALBUMIN 3.3* 3.2* 2.9*   No results for  input(s): "LIPASE", "AMYLASE" in the last 168 hours. No results for input(s): "AMMONIA" in the last 168 hours. Coagulation Profile: Recent Labs  Lab 02/11/23 2231  INR 1.2   Cardiac Enzymes: No results for input(s): "CKTOTAL", "CKMB", "CKMBINDEX", "TROPONINI" in the last 168 hours. BNP (last 3 results) No results for input(s): "PROBNP" in the last 8760 hours. HbA1C: No results for input(s): "HGBA1C" in the last 72 hours. CBG: Recent Labs  Lab 02/12/23 0601 02/12/23 1202 02/12/23 1816  GLUCAP 133* 110* 115*   Lipid Profile: No results for input(s): "CHOL", "HDL", "LDLCALC", "TRIG", "CHOLHDL", "LDLDIRECT" in the last 72 hours. Thyroid Function Tests: No results for input(s): "TSH", "T4TOTAL", "FREET4", "T3FREE", "THYROIDAB" in the last 72 hours. Anemia Panel: Recent Labs    02/11/23 1948  FERRITIN 15*  TIBC 335  IRON 18*   Urine analysis:    Component Value Date/Time   COLORURINE YELLOW 08/15/2020 0237   APPEARANCEUR HAZY (A) 08/15/2020 0237   LABSPEC 1.019 08/15/2020 0237   PHURINE 5.0 08/15/2020 0237   GLUCOSEU 50 (A) 08/15/2020 0237   HGBUR NEGATIVE 08/15/2020 0237   BILIRUBINUR NEGATIVE 08/15/2020 0237   KETONESUR NEGATIVE 08/15/2020 0237   PROTEINUR NEGATIVE 08/15/2020 0237   NITRITE NEGATIVE 08/15/2020 0237   LEUKOCYTESUR NEGATIVE 08/15/2020 0237   Sepsis Labs: @LABRCNTIP (procalcitonin:4,lacticidven:4)  )No results found for this or any previous visit (from the past 240 hours).       Radiology Studies: CT Angio Chest PE W and/or Wo Contrast Result Date: 02/11/2023 CLINICAL DATA:  Pulmonary embolism (PE) suspected, high prob pe, cancer patient, trop and bnp elevated. Shortness of breath. EXAM: CT ANGIOGRAPHY CHEST WITH CONTRAST TECHNIQUE: Multidetector CT imaging of the chest was performed using the standard protocol during bolus administration of intravenous contrast. Multiplanar CT image reconstructions and MIPs were obtained to evaluate the vascular  anatomy. RADIATION DOSE REDUCTION: This exam was performed according to the departmental dose-optimization program which includes automated exposure control, adjustment of the mA and/or kV according to patient size and/or use of iterative reconstruction technique. CONTRAST:  75mL OMNIPAQUE IOHEXOL 350 MG/ML SOLN COMPARISON:  11/20/2022 FINDINGS: Cardiovascular: No filling defects in the pulmonary arteries to suggest pulmonary emboli. Heart is normal size. Aorta is normal caliber. Scattered aortic and coronary artery calcifications. Mediastinum/Nodes: Pretracheal lymph node has a short axis diameter of 9 mm. Right paratracheal lymph node has a short axis diameter of 11 mm. Subcarinal lymph node has a short axis diameter of 13 mm. These have increased slightly since prior study. No axillary or hilar adenopathy. Trachea and esophagus are unremarkable. Thyroid unremarkable. Lungs/Pleura: Moderate bilateral pleural effusions. Nodule in the superior segment of the left lower lobe again noted measuring up to 2 cm, similar to prior study. Moderate centrilobular emphysema. Compressive atelectasis in the lower lobes. Upper Abdomen: Large low-density area partially visualized in the right hepatic lobe, difficult to visualize due to contrast timing for this CTA chest, but appears proximally  6.7 cm and likely stable since prior study. Musculoskeletal: Chest wall soft tissues are unremarkable. No acute bony abnormality. Review of the MIP images confirms the above findings. IMPRESSION: No evidence of pulmonary embolus. Scattered coronary artery disease. Superior segment left lower lobe pulmonary nodule proximally 2.1 cm, stable since prior study. 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 Moderate bilateral pleural effusions. Borderline and mildly enlarged mediastinal lymph nodes, slightly larger than prior study. Aortic Atherosclerosis  (ICD10-I70.0). Electronically Signed   By: Charlett Nose M.D.   On: 02/11/2023 21:17   DG Chest 2 View Result Date: 02/11/2023 CLINICAL DATA:  Chest pain and shortness of breath EXAM: CHEST - 2 VIEW COMPARISON:  08/15/2020 FINDINGS: Heart size and pulmonary vascularity are normal. Small bilateral pleural effusions. Emphysematous changes and fibrosis in the lungs. No pneumothorax. Mediastinal contours appear intact. Degenerative changes in the spine and shoulders. Calcification of the aorta. IMPRESSION: Emphysematous changes and scattered fibrosis in the lungs. Small bilateral pleural effusions. Electronically Signed   By: Burman Nieves M.D.   On: 02/11/2023 20:11        Scheduled Meds:  sodium chloride   Intravenous Once   insulin aspart  0-6 Units Subcutaneous Q6H   latanoprost  1 drop Both Eyes QHS   [START ON 02/14/2023] rosuvastatin  5 mg Oral Q Mon   Continuous Infusions:   LOS: 1 day    Time spent: 35 minutes.    Berton Mount, MD  [facility] Pager #: [phone] 7PM-7AM contact night coverage as above

## 2023-02-12 NOTE — ED Notes (Signed)
Pt

## 2023-02-12 NOTE — ED Notes (Signed)
Spoke with family and updated them on the phone.

## 2023-02-12 NOTE — ED Notes (Signed)
Attempted to call daughter with update.  LMVM

## 2023-02-13 ENCOUNTER — Inpatient Hospital Stay (HOSPITAL_COMMUNITY): Payer: Medicare Other

## 2023-02-13 DIAGNOSIS — D649 Anemia, unspecified: Secondary | ICD-10-CM | POA: Diagnosis not present

## 2023-02-13 DIAGNOSIS — C20 Malignant neoplasm of rectum: Secondary | ICD-10-CM | POA: Diagnosis not present

## 2023-02-13 DIAGNOSIS — K625 Hemorrhage of anus and rectum: Secondary | ICD-10-CM | POA: Diagnosis not present

## 2023-02-13 DIAGNOSIS — R7989 Other specified abnormal findings of blood chemistry: Secondary | ICD-10-CM

## 2023-02-13 DIAGNOSIS — C787 Secondary malignant neoplasm of liver and intrahepatic bile duct: Secondary | ICD-10-CM

## 2023-02-13 DIAGNOSIS — R0602 Shortness of breath: Secondary | ICD-10-CM | POA: Diagnosis not present

## 2023-02-13 LAB — ECHOCARDIOGRAM COMPLETE
AR max vel: 4.31 cm2
AV Area VTI: 4.12 cm2
AV Area mean vel: 3.93 cm2
AV Mean grad: 1 mm[Hg]
AV Peak grad: 1.8 mm[Hg]
Ao pk vel: 0.66 m/s
Area-P 1/2: 5.54 cm2
Calc EF: 44.6 %
Height: 68 in
MV M vel: 5.45 m/s
MV Peak grad: 118.7 mm[Hg]
MV VTI: 2.49 cm2
S' Lateral: 4.1 cm
Single Plane A2C EF: 45.6 %
Single Plane A4C EF: 43.2 %
Weight: 2230.4 [oz_av]

## 2023-02-13 LAB — CBC WITH DIFFERENTIAL/PLATELET
Abs Immature Granulocytes: 0.09 10*3/uL — ABNORMAL HIGH (ref 0.00–0.07)
Basophils Absolute: 0 10*3/uL (ref 0.0–0.1)
Basophils Relative: 0 %
Eosinophils Absolute: 0.1 10*3/uL (ref 0.0–0.5)
Eosinophils Relative: 0 %
HCT: 27.8 % — ABNORMAL LOW (ref 39.0–52.0)
Hemoglobin: 8.7 g/dL — ABNORMAL LOW (ref 13.0–17.0)
Immature Granulocytes: 1 %
Lymphocytes Relative: 9 %
Lymphs Abs: 1.5 10*3/uL (ref 0.7–4.0)
MCH: 28.2 pg (ref 26.0–34.0)
MCHC: 31.3 g/dL (ref 30.0–36.0)
MCV: 90 fL (ref 80.0–100.0)
Monocytes Absolute: 1.2 10*3/uL — ABNORMAL HIGH (ref 0.1–1.0)
Monocytes Relative: 7 %
Neutro Abs: 14.4 10*3/uL — ABNORMAL HIGH (ref 1.7–7.7)
Neutrophils Relative %: 83 %
Platelets: 337 10*3/uL (ref 150–400)
RBC: 3.09 MIL/uL — ABNORMAL LOW (ref 4.22–5.81)
RDW: 14.5 % (ref 11.5–15.5)
WBC: 17.2 10*3/uL — ABNORMAL HIGH (ref 4.0–10.5)
nRBC: 0 % (ref 0.0–0.2)

## 2023-02-13 LAB — TYPE AND SCREEN
ABO/RH(D): A POS
Antibody Screen: NEGATIVE
Unit division: 0

## 2023-02-13 LAB — RENAL FUNCTION PANEL
Albumin: 3 g/dL — ABNORMAL LOW (ref 3.5–5.0)
Anion gap: 7 (ref 5–15)
BUN: 14 mg/dL (ref 8–23)
CO2: 26 mmol/L (ref 22–32)
Calcium: 8.8 mg/dL — ABNORMAL LOW (ref 8.9–10.3)
Chloride: 108 mmol/L (ref 98–111)
Creatinine, Ser: 1 mg/dL (ref 0.61–1.24)
GFR, Estimated: >60 mL/min (ref >60)
Glucose, Bld: 110 mg/dL — ABNORMAL HIGH (ref 70–99)
Phosphorus: 3.3 mg/dL (ref 2.5–4.6)
Potassium: 4.1 mmol/L (ref 3.5–5.1)
Sodium: 141 mmol/L (ref 135–145)

## 2023-02-13 LAB — BPAM RBC
Blood Product Expiration Date: 202501132359
ISSUE DATE / TIME: 202412210010
Unit Type and Rh: 6200

## 2023-02-13 LAB — GLUCOSE, CAPILLARY
Glucose-Capillary: 99 mg/dL (ref 70–99)
Glucose-Capillary: 103 mg/dL — ABNORMAL HIGH (ref 70–99)
Glucose-Capillary: 119 mg/dL — ABNORMAL HIGH (ref 70–99)

## 2023-02-13 LAB — MAGNESIUM: Magnesium: 2.2 mg/dL (ref 1.7–2.4)

## 2023-02-13 LAB — PROCALCITONIN: Procalcitonin: <0.1 ng/mL

## 2023-02-13 MED ORDER — TORSEMIDE 20 MG PO TABS
20.0000 mg | ORAL_TABLET | Freq: Every day | ORAL | Status: DC
Start: 2023-02-14 — End: 2023-02-14
  Administered 2023-02-14: 20 mg via ORAL
  Filled 2023-02-13: qty 1

## 2023-02-13 MED ORDER — FUROSEMIDE 10 MG/ML IJ SOLN
40.0000 mg | Freq: Once | INTRAMUSCULAR | Status: AC
  Administered 2023-02-13: 40 mg via INTRAVENOUS
  Filled 2023-02-13: qty 4

## 2023-02-13 NOTE — Progress Notes (Signed)
  Echocardiogram 2D Echocardiogram has been performed.  Ocie Doyne RDCS 02/13/2023, 12:51 PM

## 2023-02-13 NOTE — Consult Note (Addendum)
Consultation  Referring Provider:   [facility] Primary Care Physician:  Donita Brooks, MD Primary Gastroenterologist:  Dr. Myrtie Neither previously Dr. Orvan Falconer     Reason for Consultation:      DOA: 02/11/2023         Hospital Day: 3         HPI:   Ryan Mosley is a 82 y.o. male with past medical history significant for type 2 diabetes, hypertension, metastatic rectal adenocarcinoma hepatic, pulmonary, lymph nodes and has declined treatment with chemoradiation, chronic blood loss anemia baseline hemoglobin 11-12 .   11/21/2022 flexible sigmoidoscopy for hematochezia showed bleeding secondary to rectal adenocarcinoma that was treated with with purastat. Presents to the ER 12/20 with symptomatic acute on chronic blood loss anemia with dyspnea. Work up notable for  Echo shows EF 40 to 45% left ventricle demonstrates global hypokinesis severely elevated pulmonary artery systolic pressure at 63.1 severe mitral valve regurgitation tricuspid valve regurgitation mild to moderate no aortic stenosis Hgb 7.4 compared to 1211/14, INR 1.2, platelets normal, no leukocytosis Normal liver function Creatinine 0.91, potassium 3.4 Initial BNP 1199, down trended 812 Initial troponin 812 down to 529 EKG showed nonspecific T wave inversion in V6, T depression V4 through V6, prolonged QTc at 513.  Cardiology was consulted and thought this was due to demand ischemia. CTA chest negative for PE no evidence of infiltrate, pneumothorax, did show moderate bilateral pleural effusions  Patient with family at bedside, wife and daughter. Provided some of the history.  Patient has been having intermittent bright red blood on toilet paper occasionally on the toilet since having diagnosis of rectal adenocarcinoma, has had some rectal burning but no discomfort.  Has thin stools.  Denies abdominal pain, nausea vomiting, GERD.  Had to have intermittent dysphagia where states "feels funny when he swallows".  Patient's had  significant water weight loss uncertain how much has decreased appetite, has been trying Ensure at home. Patient's not interested in chemotherapy, radiation.  Uncertain family or patient knew the extent of metastatic rectal adenocarcinoma, but understands it is progressive and understands that they do not want treatment.  Abnormal ED labs: Abnormal Labs Reviewed  MRSA NEXT GEN BY PCR, NASAL - Abnormal; Notable for the following components:      Result Value   MRSA by PCR Next Gen DETECTED (*)    All other components within normal limits  COMPREHENSIVE METABOLIC PANEL - Abnormal; Notable for the following components:   Potassium 3.4 (*)    Glucose, Bld 182 (*)    Albumin 3.2 (*)    All other components within normal limits  BRAIN NATRIURETIC PEPTIDE - Abnormal; Notable for the following components:   B Natriuretic Peptide 812.7 (*)    All other components within normal limits  CBC WITH DIFFERENTIAL/PLATELET - Abnormal; Notable for the following components:   RBC 2.63 (*)    Hemoglobin 7.4 (*)    HCT 24.3 (*)    Neutro Abs 7.9 (*)    Lymphs Abs 0.4 (*)    All other components within normal limits  PROTIME-INR - Abnormal; Notable for the following components:   Prothrombin Time 15.3 (*)    All other components within normal limits  CBC WITH DIFFERENTIAL/PLATELET - Abnormal; Notable for the following components:   WBC 11.7 (*)    RBC 3.05 (*)    Hemoglobin 8.7 (*)    HCT 27.7 (*)    Neutro Abs 10.3 (*)    All other components  within normal limits  COMPREHENSIVE METABOLIC PANEL - Abnormal; Notable for the following components:   Glucose, Bld 154 (*)    Calcium 8.4 (*)    Total Protein 6.0 (*)    Albumin 2.9 (*)    AST 42 (*)    Total Bilirubin 1.3 (*)    All other components within normal limits  HEMOGLOBIN AND HEMATOCRIT, BLOOD - Abnormal; Notable for the following components:   Hemoglobin 9.2 (*)    HCT 30.3 (*)    All other components within normal limits  FERRITIN -  Abnormal; Notable for the following components:   Ferritin 15 (*)    All other components within normal limits  IRON AND TIBC - Abnormal; Notable for the following components:   Iron 18 (*)    Saturation Ratios 5 (*)    All other components within normal limits  GLUCOSE, CAPILLARY - Abnormal; Notable for the following components:   Glucose-Capillary 115 (*)    All other components within normal limits  RENAL FUNCTION PANEL - Abnormal; Notable for the following components:   Glucose, Bld 110 (*)    Calcium 8.8 (*)    Albumin 3.0 (*)    All other components within normal limits  CBC WITH DIFFERENTIAL/PLATELET - Abnormal; Notable for the following components:   WBC 17.2 (*)    RBC 3.09 (*)    Hemoglobin 8.7 (*)    HCT 27.8 (*)    Neutro Abs 14.4 (*)    Monocytes Absolute 1.2 (*)    Abs Immature Granulocytes 0.09 (*)    All other components within normal limits  GLUCOSE, CAPILLARY - Abnormal; Notable for the following components:   Glucose-Capillary 123 (*)    All other components within normal limits  GLUCOSE, CAPILLARY - Abnormal; Notable for the following components:   Glucose-Capillary 118 (*)    All other components within normal limits  CBG MONITORING, ED - Abnormal; Notable for the following components:   Glucose-Capillary 133 (*)    All other components within normal limits  CBG MONITORING, ED - Abnormal; Notable for the following components:   Glucose-Capillary 110 (*)    All other components within normal limits  TROPONIN I (HIGH SENSITIVITY) - Abnormal; Notable for the following components:   Troponin I (High Sensitivity) 529 (*)    All other components within normal limits  TROPONIN I (HIGH SENSITIVITY) - Abnormal; Notable for the following components:   Troponin I (High Sensitivity) 510 (*)    All other components within normal limits    Past Medical History:  Diagnosis Date   Acute blood loss anemia    on meds   Blood transfusion without reported diagnosis  2022   Cataract    bilateral sx   Controlled diabetes mellitus type 2 with complications (HCC)    Diabetes mellitus without complication (HCC)    not on meds at this time   GERD (gastroesophageal reflux disease)    hx of   GI bleed    Hyperlipidemia    on meds   Hypertension    on meds   Meningitis 1960/1970   Pneumonia 1960/1970   Rectal polyp     Surgical History:  He  has a past surgical history that includes Colonoscopy with propofol (N/A, 07/08/2020); biopsy (07/08/2020); Dental surgery; XI robot assisted transanal resection (N/A, 09/25/2020); Flexible sigmoidoscopy (N/A, 11/21/2022); and Hemostasis control (11/21/2022). Family History:  His family history includes Diabetes in his son; Other in his father and mother; Seizures in his  daughter. Social History:   reports that he quit smoking about 35 years ago. His smoking use included cigarettes. He started smoking about 65 years ago. He has a 29 pack-year smoking history. His smokeless tobacco use includes chew. He reports that he does not currently use alcohol after a past usage of about 1.0 standard drink of alcohol per week. He reports that he does not use drugs.  Prior to Admission medications   Medication Sig Start Date End Date Taking? Authorizing Provider  acetaminophen (TYLENOL) 500 MG tablet Take 500 mg by mouth 2 (two) times daily as needed for moderate pain, headache or fever.   Yes [provider]  latanoprost (XALATAN) 0.005 % ophthalmic solution Place 1 drop into both eyes at bedtime. 06/13/17  Yes [provider]  lisinopril (ZESTRIL) 40 MG tablet TAKE 1 TABLET BY MOUTH EVERY DAY 08/30/22  Yes Donita Brooks, MD  rosuvastatin (CRESTOR) 5 MG tablet TAKE 1 TABLET BY MOUTH ONCE A WEEK FOR CHOLESTEROL Patient taking differently: Take 5 mg by mouth every Monday. 05/17/22  Yes Donita Brooks, MD  azithromycin (ZITHROMAX) 250 MG tablet Take 1 tablet (250 mg total) by mouth daily. Take first 2 tablets  together, then 1 every day until finished. 02/11/23   Garrison, [place] N, FNP  fluticasone [facility]) 50 MCG/ACT nasal spray Place 2 sprays into both nostrils daily. Patient not taking: Reported on 02/11/2023 12/06/22   Donita Brooks, MD  potassium chloride (KLOR-CON M) 10 MEQ tablet Take 1 tablet (10 mEq total) by mouth 2 (two) times daily. Patient not taking: Reported on 02/11/2023 12/09/22   Donita Brooks, MD    Current Facility-Administered Medications  Medication Dose Route Frequency Provider Last Rate Last Admin   0.9 %  sodium chloride infusion (Manually program via Guardrails IV Fluids)   Intravenous Once Gunnar Bulla, MD       acetaminophen (TYLENOL) tablet 650 mg  650 mg Oral Q6H PRN Howerter, Justin B, DO       Or   acetaminophen (TYLENOL) suppository 650 mg  650 mg Rectal Q6H PRN Howerter, Justin B, DO       insulin aspart (novoLOG) injection 0-6 Units  0-6 Units Subcutaneous Q6H Howerter, Justin B, DO       latanoprost (XALATAN) 0.005 % ophthalmic solution 1 drop  1 drop Both Eyes QHS Howerter, Justin B, DO   1 drop at 02/12/23 2156   melatonin tablet 3 mg  3 mg Oral QHS PRN Howerter, Justin B, DO       ondansetron (ZOFRAN) injection 4 mg  4 mg Intravenous Q6H PRN Howerter, Justin B, DO       [START ON 02/14/2023] rosuvastatin (CRESTOR) tablet 5 mg  5 mg Oral Q Mon Howerter, Justin B, DO        Allergies as of 02/11/2023 - Review Complete 02/11/2023  Allergen Reaction Noted   Glucophage [metformin] Diarrhea 01/11/2017    Review of Systems:    Constitutional: No weight loss, fever, chills, weakness or fatigue HEENT: Eyes: No change in vision               Ears, Nose, Throat:  No change in hearing or congestion Skin: No rash or itching Cardiovascular: No chest pain, chest pressure or palpitations   Respiratory: No SOB or cough Gastrointestinal: See HPI and otherwise negative Genitourinary: No dysuria or change in urinary frequency Neurological: No headache,  dizziness or syncope Musculoskeletal: No new muscle or joint pain Hematologic: No  bleeding or bruising Psychiatric: No history of depression or anxiety     Physical Exam:  Vital signs in last 24 hours: Temp:  [97.9 F (36.6 C)-98.6 F (37 C)] 98.2 F (36.8 C) (12/22 0734) Pulse Rate:  [63-81] 81 (12/22 0329) Resp:  [15-32] 28 (12/22 0734) BP: (140-157)/(76-94) 141/91 (12/22 0734) SpO2:  [98 %-100 %] 100 % (12/21 1800) Weight:  [63.2 kg] 63.2 kg (12/22 0329) Last BM Date : 02/13/23 Last BM recorded by nurses in past 5 days Stool Type: Type 6 (Mushy consistency with ragged edges) (02/13/2023  8:36 AM)  General:   Thin chronically ill AA male in no acute distress Head:  Normocephalic and atraumatic. Eyes: sclerae anicteric,conjunctive pink  Heart: Regular rate and rhythm, systolic murmur Pulm: Clear anteriorly; no wheezing Abdomen:  Soft, Obese AB, Active bowel sounds. No tenderness, No organomegaly appreciated. Extremities:  With edema. Msk:  Symmetrical without gross deformities. Peripheral pulses intact.  Neurologic:  Alert and  oriented x4;  No focal deficits.  Skin:   Dry and intact without significant lesions or rashes. Psychiatric:  Cooperative. Normal mood and affect.  LAB RESULTS: Recent Labs    02/11/23 2231 02/12/23 0410 02/12/23 1820 02/13/23 0227  WBC 8.3 11.7*  --  17.2*  HGB 7.4* 8.7* 9.2* 8.7*  HCT 24.3* 27.7* 30.3* 27.8*  PLT 370 349  --  337   BMET Recent Labs    02/11/23 1821 02/12/23 0410 02/13/23 0227  NA 139 138 141  K 3.4* 4.2 4.1  CL 102 107 108  CO2 24 25 26   GLUCOSE 182* 154* 110*  BUN 10 10 14   CREATININE 0.91 1.02 1.00  CALCIUM 9.0 8.4* 8.8*   LFT Recent Labs    02/12/23 0410 02/13/23 0227  PROT 6.0*  --   ALBUMIN 2.9* 3.0*  AST 42*  --   ALT 17  --   ALKPHOS 66  --   BILITOT 1.3*  --    PT/INR Recent Labs    02/11/23 2231  LABPROT 15.3*  INR 1.2    STUDIES: CT Angio Chest PE W and/or Wo Contrast Result Date:  02/11/2023 CLINICAL DATA:  Pulmonary embolism (PE) suspected, high prob pe, cancer patient, trop and bnp elevated. Shortness of breath. EXAM: CT ANGIOGRAPHY CHEST WITH CONTRAST TECHNIQUE: Multidetector CT imaging of the chest was performed using the standard protocol during bolus administration of intravenous contrast. Multiplanar CT image reconstructions and MIPs were obtained to evaluate the vascular anatomy. RADIATION DOSE REDUCTION: This exam was performed according to the departmental dose-optimization program which includes automated exposure control, adjustment of the mA and/or kV according to patient size and/or use of iterative reconstruction technique. CONTRAST:  75mL OMNIPAQUE IOHEXOL 350 MG/ML SOLN COMPARISON:  11/20/2022 FINDINGS: Cardiovascular: No filling defects in the pulmonary arteries to suggest pulmonary emboli. Heart is normal size. Aorta is normal caliber. Scattered aortic and coronary artery calcifications. Mediastinum/Nodes: Pretracheal lymph node has a short axis diameter of 9 mm. Right paratracheal lymph node has a short axis diameter of 11 mm. Subcarinal lymph node has a short axis diameter of 13 mm. These have increased slightly since prior study. No axillary or hilar adenopathy. Trachea and esophagus are unremarkable. Thyroid unremarkable. Lungs/Pleura: Moderate bilateral pleural effusions. Nodule in the superior segment of the left lower lobe again noted measuring up to 2 cm, similar to prior study. Moderate centrilobular emphysema. Compressive atelectasis in the lower lobes. Upper Abdomen: Large low-density area partially visualized in the right hepatic lobe, difficult to  visualize due to contrast timing for this CTA chest, but appears proximally 6.7 cm and likely stable since prior study. Musculoskeletal: Chest wall soft tissues are unremarkable. No acute bony abnormality. Review of the MIP images confirms the above findings. IMPRESSION: No evidence of pulmonary embolus. Scattered  coronary artery disease. Superior segment left lower lobe pulmonary nodule proximally 2.1 cm, stable since prior study. 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 Moderate bilateral pleural effusions. Borderline and mildly enlarged mediastinal lymph nodes, slightly larger than prior study. Aortic Atherosclerosis (ICD10-I70.0). Electronically Signed   By: Charlett Nose M.D.   On: 02/11/2023 21:17   DG Chest 2 View Result Date: 02/11/2023 CLINICAL DATA:  Chest pain and shortness of breath EXAM: CHEST - 2 VIEW COMPARISON:  08/15/2020 FINDINGS: Heart size and pulmonary vascularity are normal. Small bilateral pleural effusions. Emphysematous changes and fibrosis in the lungs. No pneumothorax. Mediastinal contours appear intact. Degenerative changes in the spine and shoulders. Calcification of the aorta. IMPRESSION: Emphysematous changes and scattered fibrosis in the lungs. Small bilateral pleural effusions. Electronically Signed   By: Burman Nieves M.D.   On: 02/11/2023 20:11      Impression/Plan:   Acute on chronic anemia secondary to known metastatic rectal adenocarcinoma Patient is elected not to have treatment with chemotherapy radiation, most recent CTA negative for PE shows worsening lymphadenopathy, hepatic metastasis, lung metastasis. 02/13/2023  HGB 8.7 MCV 90.0 Platelets 337 02/11/2023 Iron 18 Ferritin 15  Recent Labs    11/20/22 0829 11/20/22 1444 11/20/22 2152 11/21/22 0942 12/06/22 0831 02/11/23 1137 02/11/23 2231 02/12/23 0410 02/12/23 1820 02/13/23 0227  HGB 11.8* 11.4* 11.3* 11.4* 12.0* 7.6* 7.4* 8.7* 9.2* 8.7*   While patient did have flexible sigmoidoscopy with pure stat in September, bleeding secondary to rectal adenocarcinomas is not amendable to endoscopic treatment Would suggest continuing supportive care Monitor hemoglobin transfuse greater than 8 with evidence of demand ischemia  monitor for fluid overload with severe MR seen on echo Consider IV iron Can advance diet as tolerated, MiraLAX, low fiber diet keep stools soft Discussed potentially palliative radiation with patient and family but they declined. At this point with metastatic rectal adenocarcinoma declining treatment suggest palliative care/hospice consult during this admission  Elevated troponin with prolonged QT interval Thought to be second to demand ischemia Echo with depressed ejection fraction 40 to 45%, elevated pulmonary artery pressure, severe MR  moderate protein calorie malnutrition Albumin 02/13/2023  3.0  BMI body mass index is 21.2 kg/m.  Secondary to  metastatic rectal adenocarcinoma - RD consult Count calories, increase protein   Principal Problem:   Acute on chronic anemia Active Problems:   Essential hypertension   DM2 (diabetes mellitus, type 2) (HCC)   HLD (hyperlipidemia)   Acute lower GI bleeding   Elevated troponin   Hypokalemia   Prolonged QT interval    LOS: 2 days   Thank you for your kind consultation, we will continue to follow.   Doree Albee  02/13/2023, 11:05 AM  Gastroenterology attending:  I have seen and evaluated the patient with Ms. Steffanie Dunn.  I agree with her note and assessment and plan.  I have performed regarding the medical decision making which is of high complexity.  The patient has metastatic rectal adenocarcinoma and anemia with chronic intermittent rectal bleeding.  He has had a transanal resection and recurrence.  After that he declined any systemic chemotherapy or radiotherapy.  There is nothing we can offer from  an endoscopic standpoint to help this person.  He understands (we explained today) that he is likely to die from this cancer though we do not know when.  I also reviewed the potential for colonic obstruction from the rectal cancer and potential perforation and peritonitis and death.  He is not interested in a colostomy should the  need arise.  We recommend palliative care/hospice consultation.  Signing off.  Iva Boop, MD, [facility] See Loretha Stapler on call - gastroenterology for best contact person 02/13/2023 3:40 PM

## 2023-02-13 NOTE — Progress Notes (Signed)
PROGRESS NOTE    Ryan Mosley  WUJ:[id number] DOB: [birth year] DOA: 02/11/2023 PCP: Donita Brooks, MD  Outpatient Specialists:     Brief Narrative:  Patient is an 82 year old male with past medical history significant for metastatic rectal adenocarcinoma, chronic blood loss anemia, diabetes mellitus type 2, and hypertension.  Baseline hemoglobin is documented at 11 to 12 g/dL.  Patient was admitted with symptomatic anemia, elevated troponin that is deemed to be type II elevation by the cardiology team, shortness of breath and leg edema.  Apparently, patient has been having rectal bleed.  On presentation, patient's hemoglobin was 7.4 g/dL, but 8.7 g/dL today.  GI team has been consulted to see the patient.  Chest x-ray revealed small bilateral pleural effusion.  CT chest revealed moderate bilateral pleural effusion.  EKG revealed QTc interval of 513 ms.  Hyponatremia has resolved.  Echocardiogram done earlier today revealed: 1. Left ventricular ejection fraction, by estimation, is 40 to 45%. Left  ventricular ejection fraction by 3D volume is 43 %. Left ventricular  ejection fraction by 2D MOD biplane is 44.6 %. The left ventricle has  mildly decreased function. The left  ventricle demonstrates global hypokinesis. Left ventricular diastolic  function could not be evaluated.   2. Right ventricular systolic function is normal. The right ventricular  size is mildly enlarged. There is severely elevated pulmonary artery  systolic pressure. The estimated right ventricular systolic pressure is  63.1 mmHg.   3. Left atrial size was mildly dilated.   4. Large pleural effusion in the left lateral region.   5. Torrential mitral regurgitation seen in two chamber; central jet,  mechanism is unclear. TEE is recommended if clinically reasonable. The  mitral valve is abnormal. Severe mitral valve regurgitation.   6. Tricuspid valve regurgitation is mild to moderate.   7. The aortic valve is  tricuspid. Aortic valve regurgitation is not  visualized. No aortic stenosis is present.   8. The inferior vena cava is normal in size with <50% respiratory  variability, suggesting right atrial pressure of 8 mmHg.    Assessment & Plan:   Principal Problem:   Acute on chronic anemia Active Problems:   Essential hypertension   DM2 (diabetes mellitus, type 2) (HCC)   HLD (hyperlipidemia)   Acute lower GI bleeding   Elevated troponin   Hypokalemia   Prolonged QT interval   Symptomatic anemia/acute on chronic blood loss anemia: -Hemoglobin has improved to 8.7 g/dL. -History of rectal cancer, with associated rectal bleed. -Patient is known to the GI team.  GI input is appreciated.  Keep hemoglobin greater than 8 g/dL.  No further GI workup recommended.  Palliative care consult done. -Continue to monitor H/H.  Elevated troponin: -Troponin of 529. -Likely type II MI. -No chest pain reported.  Hypokalemia: -Resolved. -Potassium is 4.1 today. -Continue to monitor renal function and electrolytes.  QTc prolongation: -QTc of 513 ms. -Likely secondary to hypokalemia. -Hypokalemia has resolved. -Repeat EKG. -Avoid QTc prolonging medications.  Type 2 diabetes mellitus: -Last visualized A1c of 6.4%. -Continue to optimize.  Hypertension: -Monitor closely. -Goal blood pressure should be less than 130/80 mmHg.  Hyperlipidemia: -Continue Crestor.  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 once daily from tomorrow. -Palliative care team consulted. -Depending on the goal of care, will have a low threshold to consult/re-consult the cardiology team.  Leukocytosis: -WBC of 17.2 with neutrophil  of 14.4. -No signs of acute infection. -Pleural effusion noted. -Possibly, reactive leukocytosis. -Continue to monitor closely. -Check  procalcitonin. -Repeat CBC in the morning.  Leg edema: -Possible acute systolic CHF versus acute on chronic systolic CHF that is likely multifactorial. -Diuretics. -Optimize cardiac management. -Elevated BNP. -Elevated troponin on presentation.  DVT prophylaxis: SCD. Code Status: Full code. Family Communication: Wife and daughter. Disposition Plan: To depend on hospital course.   Consultants:  GI  Procedures:  None  Antimicrobials:  None   Subjective: No new complaints  Objective: Vitals:   02/13/23 0329 02/13/23 0734 02/13/23 1128 02/13/23 1500  BP: (!) 143/82 (!) 141/91 (!) 142/85 115/77  Pulse: 81     Resp:  (!) 28 14   Temp: 98.2 F (36.8 C) 98.2 F (36.8 C) 98.2 F (36.8 C) 98.2 F (36.8 C)  TempSrc: Oral Oral Oral Oral  SpO2:      Weight: 63.2 kg     Height:        Intake/Output Summary (Last 24 hours) at 02/13/2023 1629 Last data filed at 02/13/2023 1310 Gross per 24 hour  Intake --  Output 425 ml  Net -425 ml   Filed Weights   02/11/23 1752 02/13/23 0329  Weight: 67 kg 63.2 kg    Examination:  General exam: Patient is thin.  Not in any distress.  Awake and alert.   Respiratory system: Clear to auscultation.  Cardiovascular system: S1 & S2  Gastrointestinal system: Abdomen is nondistended, soft and nontender.  Central nervous system: Alert and oriented. No focal neurological deficits. Extremities: Bilateral lower extremity edema, improving.  Data Reviewed: I have personally reviewed following labs and imaging studies  CBC: Recent Labs  Lab 02/11/23 1137 02/11/23 2231 02/12/23 0410 02/12/23 1820 02/13/23 0227  WBC 12.3* 8.3 11.7*  --  17.2*  NEUTROABS 9.7* 7.9* 10.3*  --  14.4*  HGB 7.6* 7.4* 8.7* 9.2* 8.7*  HCT 25.4* 24.3* 27.7* 30.3* 27.8*  MCV 94.1 92.4 90.8  --  90.0  PLT 362 370 349  --  337   Basic Metabolic Panel: Recent Labs  Lab 02/11/23 1137 02/11/23 1821 02/12/23 0410 02/13/23 0227  NA 143 139 138 141  K 3.5  3.4* 4.2 4.1  CL 105 102 107 108  CO2 27 24 25 26   GLUCOSE 111* 182* 154* 110*  BUN 9 10 10 14   CREATININE 1.03 0.91 1.02 1.00  CALCIUM 8.9 9.0 8.4* 8.8*  MG  --  2.2 2.3 2.2  PHOS  --   --   --  3.3   GFR: Estimated Creatinine Clearance: 51.8 mL/min (by C-G formula based on SCr of 1 mg/dL). Liver Function Tests: Recent Labs  Lab 02/11/23 1137 02/11/23 1821 02/12/23 0410 02/13/23 0227  AST 29 28 42*  --   ALT 16 17 17   --   ALKPHOS 69 70 66  --   BILITOT 0.6 0.5 1.3*  --   PROT 6.8 6.6 6.0*  --   ALBUMIN 3.3* 3.2* 2.9* 3.0*   No results for input(s): "LIPASE", "AMYLASE" in the last 168 hours. No results for input(s): "AMMONIA" in the last 168 hours. Coagulation Profile: Recent Labs  Lab 02/11/23 2231  INR 1.2   Cardiac Enzymes: No results for input(s): "CKTOTAL", "CKMB", "CKMBINDEX", "TROPONINI" in the last 168 hours. BNP (last 3 results) No results for input(s): "PROBNP" in the last 8760 hours. HbA1C: No results for input(s): "HGBA1C" in the last 72 hours. CBG: Recent Labs  Lab 02/12/23 1816  02/12/23 2100 02/12/23 2338 02/13/23 0604 02/13/23 1127  GLUCAP 115* 123* 118* 99 103*   Lipid Profile: No results for input(s): "CHOL", "HDL", "LDLCALC", "TRIG", "CHOLHDL", "LDLDIRECT" in the last 72 hours. Thyroid Function Tests: No results for input(s): "TSH", "T4TOTAL", "FREET4", "T3FREE", "THYROIDAB" in the last 72 hours. Anemia Panel: Recent Labs    02/11/23 1948  FERRITIN 15*  TIBC 335  IRON 18*   Urine analysis:    Component Value Date/Time   COLORURINE YELLOW 08/15/2020 0237   APPEARANCEUR HAZY (A) 08/15/2020 0237   LABSPEC 1.019 08/15/2020 0237   PHURINE 5.0 08/15/2020 0237   GLUCOSEU 50 (A) 08/15/2020 0237   HGBUR NEGATIVE 08/15/2020 0237   BILIRUBINUR NEGATIVE 08/15/2020 0237   KETONESUR NEGATIVE 08/15/2020 0237   PROTEINUR NEGATIVE 08/15/2020 0237   NITRITE NEGATIVE 08/15/2020 0237   LEUKOCYTESUR NEGATIVE 08/15/2020 0237   Sepsis  Labs: @LABRCNTIP (procalcitonin:4,lacticidven:4)  ) Recent Results (from the past 240 hours)  MRSA Next Gen by PCR, Nasal     Status: Abnormal   Collection Time: 02/12/23  6:24 PM   Specimen: Nasal Mucosa; Nasal Swab  Result Value Ref Range Status   MRSA by PCR Next Gen DETECTED (A) NOT DETECTED Final    Comment: (NOTE) The GeneXpert MRSA Assay (FDA approved for NASAL specimens only), is one component of a comprehensive MRSA colonization surveillance program. It is not intended to diagnose MRSA infection nor to guide or monitor treatment for MRSA infections. Test performance is not FDA approved in patients less than 17 years old. Performed at [facility], [address], [place], [place] [address]          Radiology Studies: ECHOCARDIOGRAM COMPLETE Result Date: 02/13/2023    ECHOCARDIOGRAM REPORT   Patient Name:   Ryan Mosley Date of Exam: 02/13/2023 Medical Rec #:  [id number]     Height:       68.0 in Accession #:    [id number]    Weight:       139.4 lb Date of Birth:  [birth year]     BSA:          1.753 m Patient Age:    82 years      BP:           144/78 mmHg Patient Gender: M             HR:           76 bpm. Exam Location:  Inpatient Procedure: 2D Echo, 3D Echo, Cardiac Doppler and Color Doppler Indications:    Elevated troponin  History:        Patient has no prior history of Echocardiogram examinations.                 Risk Factors:Hypertension, Diabetes and Dyslipidemia.  Sonographer:    Vern Claude Referring Phys: [id number] JUSTIN B HOWERTER IMPRESSIONS  1. Left ventricular ejection fraction, by estimation, is 40 to 45%. Left ventricular ejection fraction by 3D volume is 43 %. Left ventricular ejection fraction by 2D MOD biplane is 44.6 %. The left ventricle has mildly decreased function. The left ventricle demonstrates global hypokinesis. Left ventricular diastolic function could not be evaluated.  2. Right ventricular systolic function is normal. The right ventricular size  is mildly enlarged. There is severely elevated pulmonary artery systolic pressure. The estimated right ventricular systolic pressure is 63.1 mmHg.  3. Left atrial size was mildly dilated.  4. Large pleural effusion in the left lateral region.  5. Torrential mitral  regurgitation seen in two chamber; central jet, mechanism is unclear. TEE is recommended if clinically reasonable. The mitral valve is abnormal. Severe mitral valve regurgitation.  6. Tricuspid valve regurgitation is mild to moderate.  7. The aortic valve is tricuspid. Aortic valve regurgitation is not visualized. No aortic stenosis is present.  8. The inferior vena cava is normal in size with <50% respiratory variability, suggesting right atrial pressure of 8 mmHg. Comparison(s): No prior Echocardiogram. FINDINGS  Left Ventricle: Left ventricular ejection fraction, by estimation, is 40 to 45%. Left ventricular ejection fraction by 2D MOD biplane is 44.6 %. Left ventricular ejection fraction by 3D volume is 43 %. The left ventricle has mildly decreased function. The left ventricle demonstrates global hypokinesis. The left ventricular internal cavity size was normal in size. There is no left ventricular hypertrophy. Left ventricular diastolic function could not be evaluated due to mitral regurgitation (moderate or greater). Left ventricular diastolic function could not be evaluated. Right Ventricle: The right ventricular size is mildly enlarged. No increase in right ventricular wall thickness. Right ventricular systolic function is normal. There is severely elevated pulmonary artery systolic pressure. The tricuspid regurgitant velocity is 3.71 m/s, and with an assumed right atrial pressure of 8 mmHg, the estimated right ventricular systolic pressure is 63.1 mmHg. Left Atrium: Left atrial size was mildly dilated. Right Atrium: Right atrial size was normal in size. Pericardium: There is no evidence of pericardial effusion. Mitral Valve: Torrential mitral  regurgitation seen in two chamber; central jet, mechanism is unclear. TEE is recommended if clinically reasonable. The mitral valve is abnormal. Severe mitral valve regurgitation. MV peak gradient, 4.6 mmHg. The mean mitral valve gradient is 1.0 mmHg. Tricuspid Valve: The tricuspid valve is normal in structure. Tricuspid valve regurgitation is mild to moderate. No evidence of tricuspid stenosis. Aortic Valve: The aortic valve is tricuspid. Aortic valve regurgitation is not visualized. No aortic stenosis is present. Aortic valve mean gradient measures 1.0 mmHg. Aortic valve peak gradient measures 1.8 mmHg. Aortic valve area, by VTI measures 4.12 cm. Pulmonic Valve: The pulmonic valve was normal in structure. Pulmonic valve regurgitation is not visualized. No evidence of pulmonic stenosis. Aorta: The aortic root and ascending aorta are structurally normal, with no evidence of dilitation. Venous: The inferior vena cava is normal in size with less than 50% respiratory variability, suggesting right atrial pressure of 8 mmHg. IAS/Shunts: The atrial septum is grossly normal. Additional Comments: There is a large pleural effusion in the left lateral region.  LEFT VENTRICLE PLAX 2D                        Biplane EF (MOD) LVIDd:         5.40 cm         LV Biplane EF:   Left LVIDs:         4.10 cm                          ventricular LV PW:         0.60 cm                          ejection LV IVS:        0.70 cm                          fraction by LVOT diam:  2.30 cm                          2D MOD LV SV:         53                               biplane is LV SV Index:   30                               44.6 %. LVOT Area:     4.15 cm                                Diastology                                LV e' medial:    5.98 cm/s LV Volumes (MOD)               LV E/e' medial:  21.9 LV vol d, MOD    133.0 ml      LV e' lateral:   11.00 cm/s A2C:                           LV E/e' lateral: 11.9 LV vol d, MOD    151.0 ml  A4C: LV vol s, MOD    72.4 ml       3D Volume EF A2C:                           LV 3D EF:    Left LV vol s, MOD    85.8 ml                    ventricul A4C:                                        ar LV SV MOD A2C:   60.6 ml                    ejection LV SV MOD A4C:   151.0 ml                   fraction LV SV MOD BP:    63.8 ml                    by 3D                                             volume is                                             43 %.  3D Volume EF:                                3D EF:        43 % RIGHT VENTRICLE             IVC RV Basal diam:  4.00 cm     IVC diam: 1.90 cm RV S prime:     20.10 cm/s TAPSE (M-mode): 2.8 cm LEFT ATRIUM             Index        RIGHT ATRIUM           Index LA diam:        4.00 cm 2.28 cm/m   RA Area:     15.20 cm LA Vol (A2C):   51.6 ml 29.43 ml/m  RA Volume:   40.90 ml  23.33 ml/m LA Vol (A4C):   63.4 ml 36.17 ml/m LA Biplane Vol: 61.3 ml 34.97 ml/m  AORTIC VALVE                    PULMONIC VALVE AV Area (Vmax):    4.31 cm     PV Vmax:       0.61 m/s AV Area (Vmean):   3.93 cm     PV Peak grad:  1.5 mmHg AV Area (VTI):     4.12 cm AV Vmax:           66.20 cm/s AV Vmean:          44.900 cm/s AV VTI:            0.129 m AV Peak Grad:      1.8 mmHg AV Mean Grad:      1.0 mmHg LVOT Vmax:         68.70 cm/s LVOT Vmean:        42.500 cm/s LVOT VTI:          0.128 m LVOT/AV VTI ratio: 0.99  AORTA Ao Root diam: 3.60 cm Ao Asc diam:  3.30 cm MITRAL VALVE                TRICUSPID VALVE MV Area (PHT): 5.54 cm     TR Peak grad:   55.1 mmHg MV Area VTI:   2.49 cm     TR Vmax:        371.00 cm/s MV Peak grad:  4.6 mmHg MV Mean grad:  1.0 mmHg     SHUNTS MV Vmax:       1.07 m/s     Systemic VTI:  0.13 m MV Vmean:      56.2 cm/s    Systemic Diam: 2.30 cm MV Decel Time: 137 msec MR Peak grad: 118.7 mmHg MR Mean grad: 79.3 mmHg MR Vmax:      544.67 cm/s MR Vmean:     420.7 cm/s MV E velocity: 131.00 cm/s MV A velocity: 54.60 cm/s MV E/A  ratio:  2.40 Riley Lam MD Electronically signed by Riley Lam MD Signature Date/Time: 02/13/2023/1:51:18 PM    Final    CT Angio Chest PE W and/or Wo Contrast Result Date: 02/11/2023 CLINICAL DATA:  Pulmonary embolism (PE) suspected, high prob pe, cancer patient, trop and bnp elevated. Shortness of breath. EXAM: CT ANGIOGRAPHY CHEST WITH CONTRAST TECHNIQUE: Multidetector CT imaging of the chest was performed using the standard protocol during bolus administration of intravenous contrast. Multiplanar  CT image reconstructions and MIPs were obtained to evaluate the vascular anatomy. RADIATION DOSE REDUCTION: This exam was performed according to the departmental dose-optimization program which includes automated exposure control, adjustment of the mA and/or kV according to patient size and/or use of iterative reconstruction technique. CONTRAST:  75mL OMNIPAQUE IOHEXOL 350 MG/ML SOLN COMPARISON:  11/20/2022 FINDINGS: Cardiovascular: No filling defects in the pulmonary arteries to suggest pulmonary emboli. Heart is normal size. Aorta is normal caliber. Scattered aortic and coronary artery calcifications. Mediastinum/Nodes: Pretracheal lymph node has a short axis diameter of 9 mm. Right paratracheal lymph node has a short axis diameter of 11 mm. Subcarinal lymph node has a short axis diameter of 13 mm. These have increased slightly since prior study. No axillary or hilar adenopathy. Trachea and esophagus are unremarkable. Thyroid unremarkable. Lungs/Pleura: Moderate bilateral pleural effusions. Nodule in the superior segment of the left lower lobe again noted measuring up to 2 cm, similar to prior study. Moderate centrilobular emphysema. Compressive atelectasis in the lower lobes. Upper Abdomen: Large low-density area partially visualized in the right hepatic lobe, difficult to visualize due to contrast timing for this CTA chest, but appears proximally 6.7 cm and likely stable since prior study.  Musculoskeletal: Chest wall soft tissues are unremarkable. No acute bony abnormality. Review of the MIP images confirms the above findings. IMPRESSION: No evidence of pulmonary embolus. Scattered coronary artery disease. Superior segment left lower lobe pulmonary nodule proximally 2.1 cm, stable since prior study. 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 Moderate bilateral pleural effusions. Borderline and mildly enlarged mediastinal lymph nodes, slightly larger than prior study. Aortic Atherosclerosis (ICD10-I70.0). Electronically Signed   By: Charlett Nose M.D.   On: 02/11/2023 21:17   DG Chest 2 View Result Date: 02/11/2023 CLINICAL DATA:  Chest pain and shortness of breath EXAM: CHEST - 2 VIEW COMPARISON:  08/15/2020 FINDINGS: Heart size and pulmonary vascularity are normal. Small bilateral pleural effusions. Emphysematous changes and fibrosis in the lungs. No pneumothorax. Mediastinal contours appear intact. Degenerative changes in the spine and shoulders. Calcification of the aorta. IMPRESSION: Emphysematous changes and scattered fibrosis in the lungs. Small bilateral pleural effusions. Electronically Signed   By: Burman Nieves M.D.   On: 02/11/2023 20:11        Scheduled Meds:  sodium chloride   Intravenous Once   insulin aspart  0-6 Units Subcutaneous Q6H   latanoprost  1 drop Both Eyes QHS   [START ON 02/14/2023] rosuvastatin  5 mg Oral Q Mon   [START ON 02/14/2023] torsemide  20 mg Oral Daily   Continuous Infusions:   LOS: 2 days    Time spent: 55 minutes.    Berton Mount, MD  [facility] Pager #: [phone] 7PM-7AM contact night coverage as above

## 2023-02-14 ENCOUNTER — Ambulatory Visit: Payer: Medicare Other | Admitting: Family Medicine

## 2023-02-14 DIAGNOSIS — R64 Cachexia: Secondary | ICD-10-CM

## 2023-02-14 DIAGNOSIS — D649 Anemia, unspecified: Secondary | ICD-10-CM | POA: Diagnosis not present

## 2023-02-14 DIAGNOSIS — K922 Gastrointestinal hemorrhage, unspecified: Secondary | ICD-10-CM | POA: Diagnosis not present

## 2023-02-14 DIAGNOSIS — C20 Malignant neoplasm of rectum: Secondary | ICD-10-CM

## 2023-02-14 DIAGNOSIS — Z515 Encounter for palliative care: Secondary | ICD-10-CM

## 2023-02-14 DIAGNOSIS — Z66 Do not resuscitate: Secondary | ICD-10-CM | POA: Diagnosis not present

## 2023-02-14 LAB — CBC WITH DIFFERENTIAL/PLATELET
Abs Immature Granulocytes: 0.07 10*3/uL (ref 0.00–0.07)
Basophils Absolute: 0 10*3/uL (ref 0.0–0.1)
Basophils Relative: 0 %
Eosinophils Absolute: 0.1 10*3/uL (ref 0.0–0.5)
Eosinophils Relative: 1 %
HCT: 28.2 % — ABNORMAL LOW (ref 39.0–52.0)
Hemoglobin: 8.8 g/dL — ABNORMAL LOW (ref 13.0–17.0)
Immature Granulocytes: 1 %
Lymphocytes Relative: 9 %
Lymphs Abs: 1.3 10*3/uL (ref 0.7–4.0)
MCH: 28.1 pg (ref 26.0–34.0)
MCHC: 31.2 g/dL (ref 30.0–36.0)
MCV: 90.1 fL (ref 80.0–100.0)
Monocytes Absolute: 1.1 10*3/uL — ABNORMAL HIGH (ref 0.1–1.0)
Monocytes Relative: 8 %
Neutro Abs: 11.6 10*3/uL — ABNORMAL HIGH (ref 1.7–7.7)
Neutrophils Relative %: 81 %
Platelets: 336 10*3/uL (ref 150–400)
RBC: 3.13 MIL/uL — ABNORMAL LOW (ref 4.22–5.81)
RDW: 14 % (ref 11.5–15.5)
WBC: 14.2 10*3/uL — ABNORMAL HIGH (ref 4.0–10.5)
nRBC: 0 % (ref 0.0–0.2)

## 2023-02-14 LAB — RENAL FUNCTION PANEL
Albumin: 3 g/dL — ABNORMAL LOW (ref 3.5–5.0)
Anion gap: 8 (ref 5–15)
BUN: 15 mg/dL (ref 8–23)
CO2: 26 mmol/L (ref 22–32)
Calcium: 8.6 mg/dL — ABNORMAL LOW (ref 8.9–10.3)
Chloride: 105 mmol/L (ref 98–111)
Creatinine, Ser: 0.9 mg/dL (ref 0.61–1.24)
GFR, Estimated: >60 mL/min (ref >60)
Glucose, Bld: 108 mg/dL — ABNORMAL HIGH (ref 70–99)
Phosphorus: 3.5 mg/dL (ref 2.5–4.6)
Potassium: 3.3 mmol/L — ABNORMAL LOW (ref 3.5–5.1)
Sodium: 139 mmol/L (ref 135–145)

## 2023-02-14 LAB — GLUCOSE, CAPILLARY
Glucose-Capillary: 94 mg/dL (ref 70–99)
Glucose-Capillary: 97 mg/dL (ref 70–99)
Glucose-Capillary: 164 mg/dL — ABNORMAL HIGH (ref 70–99)

## 2023-02-14 MED ORDER — POTASSIUM CHLORIDE CRYS ER 20 MEQ PO TBCR
40.0000 meq | EXTENDED_RELEASE_TABLET | ORAL | Status: DC
  Administered 2023-02-14: 40 meq via ORAL
  Filled 2023-02-14: qty 2

## 2023-02-14 NOTE — Progress Notes (Incomplete)
PROGRESS NOTE    Ryan Mosley  YQI:[id number] DOB: [birth year] DOA: 02/11/2023 PCP: Donita Brooks, MD   Brief Narrative: No notes on file   Assessment and Plan:  Symptomatic anemia Acute on chronic blood loss anemia*** ***  GI bleeding*** ***  Elevated troponin ***  Hypokalemia ***  Metastatic rectal adenocarcinoma ***  QTc prolongation ***  Diabetes mellitus type 2 ***  Primary hypertension ***  Hyperlipidemia -Continue Crestor  Severe pulmonary hypertension ***  Severe mitral regurgitation ***  HFrEF ***  Leukocytosis ***  Peripheral edema ***   DVT prophylaxis: SCD Code Status:   Code Status: Full Code Family Communication: *** Disposition Plan: ***   Consultants:  ***  Procedures:  ***  Antimicrobials: ***    Subjective: ***  Objective: BP 122/65 (BP Location: Right Arm)   Pulse 77   Temp 98.1 F (36.7 C) (Axillary)   Resp 20   Ht 5\' 8"  (1.727 m)   Wt 60.5 kg   SpO2 98%   BMI 20.28 kg/m   Examination:  General exam: Appears calm and comfortable *** Respiratory system: Clear to auscultation. Respiratory effort normal. Cardiovascular system: S1 & S2 heard, RRR. No murmurs, rubs, gallops or clicks. Gastrointestinal system: Abdomen is nondistended, soft and nontender. No organomegaly or masses felt. Normal bowel sounds heard. Central nervous system: Alert and oriented. No focal neurological deficits. Musculoskeletal: No edema. No calf tenderness Skin: No cyanosis. No rashes Psychiatry: Judgement and insight appear normal. Mood & affect appropriate.    Data Reviewed: I have personally reviewed following labs and imaging studies  CBC Lab Results  Component Value Date   WBC 14.2 (H) 02/14/2023   RBC 3.13 (L) 02/14/2023   HGB 8.8 (L) 02/14/2023   HCT 28.2 (L) 02/14/2023   MCV 90.1 02/14/2023   MCH 28.1 02/14/2023   PLT 336 02/14/2023   MCHC 31.2 02/14/2023   RDW 14.0 02/14/2023   LYMPHSABS 1.3  02/14/2023   MONOABS 1.1 (H) 02/14/2023   EOSABS 0.1 02/14/2023   BASOSABS 0.0 02/14/2023     Last metabolic panel Lab Results  Component Value Date   NA 139 02/14/2023   K 3.3 (L) 02/14/2023   CL 105 02/14/2023   CO2 26 02/14/2023   BUN 15 02/14/2023   CREATININE 0.90 02/14/2023   GLUCOSE 108 (H) 02/14/2023   GFRNONAA >60 02/14/2023   GFRAA 91 07/17/2020   CALCIUM 8.6 (L) 02/14/2023   PHOS 3.5 02/14/2023   PROT 6.0 (L) 02/12/2023   ALBUMIN 3.0 (L) 02/14/2023   BILITOT 1.3 (H) 02/12/2023   ALKPHOS 66 02/12/2023   AST 42 (H) 02/12/2023   ALT 17 02/12/2023   ANIONGAP 8 02/14/2023    GFR: Estimated Creatinine Clearance: 55.1 mL/min (by C-G formula based on SCr of 0.9 mg/dL).  Recent Results (from the past 240 hours)  MRSA Next Gen by PCR, Nasal     Status: Abnormal   Collection Time: 02/12/23  6:24 PM   Specimen: Nasal Mucosa; Nasal Swab  Result Value Ref Range Status   MRSA by PCR Next Gen DETECTED (A) NOT DETECTED Final    Comment: (NOTE) The GeneXpert MRSA Assay (FDA approved for NASAL specimens only), is one component of a comprehensive MRSA colonization surveillance program. It is not intended to diagnose MRSA infection nor to guide or monitor treatment for MRSA infections. Test performance is not FDA approved in patients less than 62 years old. Performed at [facility], [address], [place], [place] [address]  Radiology Studies: ECHOCARDIOGRAM COMPLETE Result Date: 02/13/2023    ECHOCARDIOGRAM REPORT   Patient Name:   Ryan Mosley Date of Exam: 02/13/2023 Medical Rec #:  [id number]     Height:       68.0 in Accession #:    [id number]    Weight:       139.4 lb Date of Birth:  August 15, 1940     BSA:          1.753 m Patient Age:    82 years      BP:           144/78 mmHg Patient Gender: M             HR:           76 bpm. Exam Location:  Inpatient Procedure: 2D Echo, 3D Echo, Cardiac Doppler and Color Doppler Indications:    Elevated troponin   History:        Patient has no prior history of Echocardiogram examinations.                 Risk Factors:Hypertension, Diabetes and Dyslipidemia.  Sonographer:    Vern Claude Referring Phys: [id number] JUSTIN B HOWERTER IMPRESSIONS  1. Left ventricular ejection fraction, by estimation, is 40 to 45%. Left ventricular ejection fraction by 3D volume is 43 %. Left ventricular ejection fraction by 2D MOD biplane is 44.6 %. The left ventricle has mildly decreased function. The left ventricle demonstrates global hypokinesis. Left ventricular diastolic function could not be evaluated.  2. Right ventricular systolic function is normal. The right ventricular size is mildly enlarged. There is severely elevated pulmonary artery systolic pressure. The estimated right ventricular systolic pressure is 63.1 mmHg.  3. Left atrial size was mildly dilated.  4. Large pleural effusion in the left lateral region.  5. Torrential mitral regurgitation seen in two chamber; central jet, mechanism is unclear. TEE is recommended if clinically reasonable. The mitral valve is abnormal. Severe mitral valve regurgitation.  6. Tricuspid valve regurgitation is mild to moderate.  7. The aortic valve is tricuspid. Aortic valve regurgitation is not visualized. No aortic stenosis is present.  8. The inferior vena cava is normal in size with <50% respiratory variability, suggesting right atrial pressure of 8 mmHg. Comparison(s): No prior Echocardiogram. FINDINGS  Left Ventricle: Left ventricular ejection fraction, by estimation, is 40 to 45%. Left ventricular ejection fraction by 2D MOD biplane is 44.6 %. Left ventricular ejection fraction by 3D volume is 43 %. The left ventricle has mildly decreased function. The left ventricle demonstrates global hypokinesis. The left ventricular internal cavity size was normal in size. There is no left ventricular hypertrophy. Left ventricular diastolic function could not be evaluated due to mitral regurgitation  (moderate or greater). Left ventricular diastolic function could not be evaluated. Right Ventricle: The right ventricular size is mildly enlarged. No increase in right ventricular wall thickness. Right ventricular systolic function is normal. There is severely elevated pulmonary artery systolic pressure. The tricuspid regurgitant velocity is 3.71 m/s, and with an assumed right atrial pressure of 8 mmHg, the estimated right ventricular systolic pressure is 63.1 mmHg. Left Atrium: Left atrial size was mildly dilated. Right Atrium: Right atrial size was normal in size. Pericardium: There is no evidence of pericardial effusion. Mitral Valve: Torrential mitral regurgitation seen in two chamber; central jet, mechanism is unclear. TEE is recommended if clinically reasonable. The mitral valve is abnormal. Severe mitral valve regurgitation. MV peak gradient, 4.6 mmHg. The mean mitral valve gradient  is 1.0 mmHg. Tricuspid Valve: The tricuspid valve is normal in structure. Tricuspid valve regurgitation is mild to moderate. No evidence of tricuspid stenosis. Aortic Valve: The aortic valve is tricuspid. Aortic valve regurgitation is not visualized. No aortic stenosis is present. Aortic valve mean gradient measures 1.0 mmHg. Aortic valve peak gradient measures 1.8 mmHg. Aortic valve area, by VTI measures 4.12 cm. Pulmonic Valve: The pulmonic valve was normal in structure. Pulmonic valve regurgitation is not visualized. No evidence of pulmonic stenosis. Aorta: The aortic root and ascending aorta are structurally normal, with no evidence of dilitation. Venous: The inferior vena cava is normal in size with less than 50% respiratory variability, suggesting right atrial pressure of 8 mmHg. IAS/Shunts: The atrial septum is grossly normal. Additional Comments: There is a large pleural effusion in the left lateral region.  LEFT VENTRICLE PLAX 2D                        Biplane EF (MOD) LVIDd:         5.40 cm         LV Biplane EF:   Left  LVIDs:         4.10 cm                          ventricular LV PW:         0.60 cm                          ejection LV IVS:        0.70 cm                          fraction by LVOT diam:     2.30 cm                          2D MOD LV SV:         53                               biplane is LV SV Index:   30                               44.6 %. LVOT Area:     4.15 cm                                Diastology                                LV e' medial:    5.98 cm/s LV Volumes (MOD)               LV E/e' medial:  21.9 LV vol d, MOD    133.0 ml      LV e' lateral:   11.00 cm/s A2C:                           LV E/e' lateral: 11.9 LV vol d, MOD    151.0 ml A4C: LV vol s, MOD    72.4 ml  3D Volume EF A2C:                           LV 3D EF:    Left LV vol s, MOD    85.8 ml                    ventricul A4C:                                        ar LV SV MOD A2C:   60.6 ml                    ejection LV SV MOD A4C:   151.0 ml                   fraction LV SV MOD BP:    63.8 ml                    by 3D                                             volume is                                             43 %.                                 3D Volume EF:                                3D EF:        43 % RIGHT VENTRICLE             IVC RV Basal diam:  4.00 cm     IVC diam: 1.90 cm RV S prime:     20.10 cm/s TAPSE (M-mode): 2.8 cm LEFT ATRIUM             Index        RIGHT ATRIUM           Index LA diam:        4.00 cm 2.28 cm/m   RA Area:     15.20 cm LA Vol (A2C):   51.6 ml 29.43 ml/m  RA Volume:   40.90 ml  23.33 ml/m LA Vol (A4C):   63.4 ml 36.17 ml/m LA Biplane Vol: 61.3 ml 34.97 ml/m  AORTIC VALVE                    PULMONIC VALVE AV Area (Vmax):    4.31 cm     PV Vmax:       0.61 m/s AV Area (Vmean):   3.93 cm     PV Peak grad:  1.5 mmHg AV Area (VTI):     4.12 cm AV Vmax:           66.20 cm/s AV Vmean:          44.900 cm/s AV VTI:  0.129 m AV Peak Grad:      1.8 mmHg AV Mean Grad:      1.0 mmHg  LVOT Vmax:         68.70 cm/s LVOT Vmean:        42.500 cm/s LVOT VTI:          0.128 m LVOT/AV VTI ratio: 0.99  AORTA Ao Root diam: 3.60 cm Ao Asc diam:  3.30 cm MITRAL VALVE                TRICUSPID VALVE MV Area (PHT): 5.54 cm     TR Peak grad:   55.1 mmHg MV Area VTI:   2.49 cm     TR Vmax:        371.00 cm/s MV Peak grad:  4.6 mmHg MV Mean grad:  1.0 mmHg     SHUNTS MV Vmax:       1.07 m/s     Systemic VTI:  0.13 m MV Vmean:      56.2 cm/s    Systemic Diam: 2.30 cm MV Decel Time: 137 msec MR Peak grad: 118.7 mmHg MR Mean grad: 79.3 mmHg MR Vmax:      544.67 cm/s MR Vmean:     420.7 cm/s MV E velocity: 131.00 cm/s MV A velocity: 54.60 cm/s MV E/A ratio:  2.40 Riley Lam MD Electronically signed by Riley Lam MD Signature Date/Time: 02/13/2023/1:51:18 PM    Final       LOS: 3 days    Jacquelin Hawking, MD [facility] 02/14/2023, 7:32 AM   If 7PM-7AM, please contact night-coverage [email or web address]

## 2023-02-14 NOTE — Hospital Course (Signed)
Ryan Mosley is a 82 y.o. male with a history of sleep metastatic rectal adenocarcinoma, chronic blood loss anemia, diabetes mellitus type 2, primary pretension.  Patient presented secondary to shortness of breath with concern for symptomatic acute on chronic blood loss anemia secondary to GI bleeding.  Patient was transfused 1 unit of PRBC.  GI was consulted and decision was made for no endoscopic evaluation was secondary to patient's goals of care.  Patient does not want treatment for his cancer.  Palliative care was consulted and patient discharged home on hospice.Marland Kitchen

## 2023-02-14 NOTE — Discharge Summary (Signed)
Physician Discharge Summary   Patient: Ryan Mosley MRN: [id number] DOB: December 20, 1940  Admit date:     02/11/2023  Discharge date: 02/14/23  Discharge Physician: Jacquelin Hawking, MD   PCP: Donita Brooks, MD   Recommendations at discharge:  PCP visit for hospital follow-up as needed Hospice follow-up  Discharge Diagnoses: Principal Problem:   Acute on chronic anemia Active Problems:   Essential hypertension   DM2 (diabetes mellitus, type 2) (HCC)   HLD (hyperlipidemia)   Acute lower GI bleeding   Elevated troponin   Hypokalemia   Prolonged QT interval  Resolved Problems:   * No resolved hospital problems. *  Hospital Course: Ryan Mosley is a 82 y.o. male with a history of sleep metastatic rectal adenocarcinoma, chronic blood loss anemia, diabetes mellitus type 2, primary pretension.  Patient presented secondary to shortness of breath with concern for symptomatic acute on chronic blood loss anemia secondary to GI bleeding.  Patient was transfused 1 unit of PRBC.  GI was consulted and decision was made for no endoscopic evaluation was secondary to patient's goals of care.  Patient does not want treatment for his cancer.  Palliative care was consulted and patient discharged home on hospice..  Assessment and Plan:  Symptomatic anemia Acute on chronic blood loss anemia Secondary to ongoing GI bleeding from known rectal cancer.  Hemoglobin down to a low of 7.4 hours non as per deciliter.  1 unit PRBC given for management.   GI bleeding Secondary to known rectal cancer.  GI was consulted and patient has decided for no aggressive therapies.   Demand ischemia Presumed diagnosis.  Troponin as high as 529 with downtrend to 510.  No associated chest pain or any EKG findings concerning for ACS.   Hypokalemia Secondary to torsemide.  Patient given potassium supplementation as needed.   Metastatic rectal adenocarcinoma Noted.  Patient seen by gastroenterology this admission.  Patient  is clear that he does not want any treatment for this disease process.  Palliative care was consulted and referred patient to hospice at home.   QTc prolongation Last QTc mildly elevated at 498 ms.   Diabetes mellitus type 2 Well-controlled for age.  Last hemoglobin A1c of 6.4%.  No medication management recommended at this time.   Primary hypertension Patient is on lisinopril as an outpatient.  Lisinopril held on discharge secondary to normotensive blood pressures in setting of ongoing anemia.  Will hold to reduce the risk of hypotension.   Hyperlipidemia -Continue Crestor   Severe pulmonary hypertension Severe mitral regurgitation Noted on echocardiogram.  Patient has been enrolled with hospice.  Follow-up as needed and as desired per goals of care.   HFrEF Unclear if this is acute or chronic.  Patient was managed with torsemide.  Patient requesting was medical care.  Will defer continued medical management versus cardiology consult pending ongoing patient wishes for goals of care.  Can follow-up with PCP for ongoing decision making as needed.   Leukocytosis Possibly related to dexamethasone injection earlier this admission.  White blood cell count up to 17,200.  No evidence of infection.  White blood cell count down to 14,200 prior to discharge.   Peripheral edema Patient treated with torsemide with improvement.   Consultants: Gastroenterology, palliative care medicine Procedures performed: Transthoracic echocardiogram Disposition: Home with hospice Diet recommendation: Regular diet/carb modified   DISCHARGE MEDICATION: Allergies as of 02/14/2023       Reactions   Glucophage [metformin] Diarrhea        Medication List  STOP taking these medications    azithromycin 250 MG tablet Commonly known as: ZITHROMAX   lisinopril 40 MG tablet Commonly known as: ZESTRIL   potassium chloride 10 MEQ tablet Commonly known as: KLOR-CON M       TAKE these medications     acetaminophen 500 MG tablet Commonly known as: TYLENOL Take 500 mg by mouth 2 (two) times daily as needed for moderate pain, headache or fever.   fluticasone 50 MCG/ACT nasal spray Commonly known as: FLONASE Place 2 sprays into both nostrils daily.   latanoprost 0.005 % ophthalmic solution Commonly known as: XALATAN Place 1 drop into both eyes at bedtime.   rosuvastatin 5 MG tablet Commonly known as: CRESTOR TAKE 1 TABLET BY MOUTH ONCE A WEEK FOR CHOLESTEROL What changed:  how much to take how to take this when to take this additional instructions        Follow-up Information     Donita Brooks, MD. Schedule an appointment as soon as possible for a visit.   Specialty: Family Medicine Why: As needed Contact information: [address] [place] [address] [phone]                Discharge Exam: BP 105/62 (BP Location: Left Arm)   Pulse 79   Temp 97.6 F (36.4 C) (Oral)   Resp 20   Ht 5\' 8"  (1.727 m)   Wt 60.5 kg   SpO2 98%   BMI 20.28 kg/m   General exam: Appears calm and comfortable Respiratory system: Clear to auscultation. Respiratory effort normal. Cardiovascular system: S1 & S2 heard, RRR. Gastrointestinal system: Abdomen is nondistended, soft and nontender.  Normal bowel sounds heard. Central nervous system: Alert and oriented. Psychiatry: Judgement and insight appear normal. Mood & affect appropriate.   Condition at discharge: stable  The results of significant diagnostics from this hospitalization (including imaging, microbiology, ancillary and laboratory) are listed below for reference.   Imaging Studies: ECHOCARDIOGRAM COMPLETE Result Date: 02/13/2023    ECHOCARDIOGRAM REPORT   Patient Name:   Ryan Mosley Date of Exam: 02/13/2023 Medical Rec #:  [id number]     Height:       68.0 in Accession #:    [id number]    Weight:       139.4 lb Date of Birth:  1940-05-12     BSA:          1.753 m Patient Age:    82 years      BP:            144/78 mmHg Patient Gender: M             HR:           76 bpm. Exam Location:  Inpatient Procedure: 2D Echo, 3D Echo, Cardiac Doppler and Color Doppler Indications:    Elevated troponin  History:        Patient has no prior history of Echocardiogram examinations.                 Risk Factors:Hypertension, Diabetes and Dyslipidemia.  Sonographer:    Vern Claude Referring Phys: [id number] JUSTIN B HOWERTER IMPRESSIONS  1. Left ventricular ejection fraction, by estimation, is 40 to 45%. Left ventricular ejection fraction by 3D volume is 43 %. Left ventricular ejection fraction by 2D MOD biplane is 44.6 %. The left ventricle has mildly decreased function. The left ventricle demonstrates global hypokinesis. Left ventricular diastolic function could not be evaluated.  2. Right  ventricular systolic function is normal. The right ventricular size is mildly enlarged. There is severely elevated pulmonary artery systolic pressure. The estimated right ventricular systolic pressure is 63.1 mmHg.  3. Left atrial size was mildly dilated.  4. Large pleural effusion in the left lateral region.  5. Torrential mitral regurgitation seen in two chamber; central jet, mechanism is unclear. TEE is recommended if clinically reasonable. The mitral valve is abnormal. Severe mitral valve regurgitation.  6. Tricuspid valve regurgitation is mild to moderate.  7. The aortic valve is tricuspid. Aortic valve regurgitation is not visualized. No aortic stenosis is present.  8. The inferior vena cava is normal in size with <50% respiratory variability, suggesting right atrial pressure of 8 mmHg. Comparison(s): No prior Echocardiogram. FINDINGS  Left Ventricle: Left ventricular ejection fraction, by estimation, is 40 to 45%. Left ventricular ejection fraction by 2D MOD biplane is 44.6 %. Left ventricular ejection fraction by 3D volume is 43 %. The left ventricle has mildly decreased function. The left ventricle demonstrates global hypokinesis.  The left ventricular internal cavity size was normal in size. There is no left ventricular hypertrophy. Left ventricular diastolic function could not be evaluated due to mitral regurgitation (moderate or greater). Left ventricular diastolic function could not be evaluated. Right Ventricle: The right ventricular size is mildly enlarged. No increase in right ventricular wall thickness. Right ventricular systolic function is normal. There is severely elevated pulmonary artery systolic pressure. The tricuspid regurgitant velocity is 3.71 m/s, and with an assumed right atrial pressure of 8 mmHg, the estimated right ventricular systolic pressure is 63.1 mmHg. Left Atrium: Left atrial size was mildly dilated. Right Atrium: Right atrial size was normal in size. Pericardium: There is no evidence of pericardial effusion. Mitral Valve: Torrential mitral regurgitation seen in two chamber; central jet, mechanism is unclear. TEE is recommended if clinically reasonable. The mitral valve is abnormal. Severe mitral valve regurgitation. MV peak gradient, 4.6 mmHg. The mean mitral valve gradient is 1.0 mmHg. Tricuspid Valve: The tricuspid valve is normal in structure. Tricuspid valve regurgitation is mild to moderate. No evidence of tricuspid stenosis. Aortic Valve: The aortic valve is tricuspid. Aortic valve regurgitation is not visualized. No aortic stenosis is present. Aortic valve mean gradient measures 1.0 mmHg. Aortic valve peak gradient measures 1.8 mmHg. Aortic valve area, by VTI measures 4.12 cm. Pulmonic Valve: The pulmonic valve was normal in structure. Pulmonic valve regurgitation is not visualized. No evidence of pulmonic stenosis. Aorta: The aortic root and ascending aorta are structurally normal, with no evidence of dilitation. Venous: The inferior vena cava is normal in size with less than 50% respiratory variability, suggesting right atrial pressure of 8 mmHg. IAS/Shunts: The atrial septum is grossly normal.  Additional Comments: There is a large pleural effusion in the left lateral region.  LEFT VENTRICLE PLAX 2D                        Biplane EF (MOD) LVIDd:         5.40 cm         LV Biplane EF:   Left LVIDs:         4.10 cm                          ventricular LV PW:         0.60 cm  ejection LV IVS:        0.70 cm                          fraction by LVOT diam:     2.30 cm                          2D MOD LV SV:         53                               biplane is LV SV Index:   30                               44.6 %. LVOT Area:     4.15 cm                                Diastology                                LV e' medial:    5.98 cm/s LV Volumes (MOD)               LV E/e' medial:  21.9 LV vol d, MOD    133.0 ml      LV e' lateral:   11.00 cm/s A2C:                           LV E/e' lateral: 11.9 LV vol d, MOD    151.0 ml A4C: LV vol s, MOD    72.4 ml       3D Volume EF A2C:                           LV 3D EF:    Left LV vol s, MOD    85.8 ml                    ventricul A4C:                                        ar LV SV MOD A2C:   60.6 ml                    ejection LV SV MOD A4C:   151.0 ml                   fraction LV SV MOD BP:    63.8 ml                    by 3D                                             volume is  43 %.                                 3D Volume EF:                                3D EF:        43 % RIGHT VENTRICLE             IVC RV Basal diam:  4.00 cm     IVC diam: 1.90 cm RV S prime:     20.10 cm/s TAPSE (M-mode): 2.8 cm LEFT ATRIUM             Index        RIGHT ATRIUM           Index LA diam:        4.00 cm 2.28 cm/m   RA Area:     15.20 cm LA Vol (A2C):   51.6 ml 29.43 ml/m  RA Volume:   40.90 ml  23.33 ml/m LA Vol (A4C):   63.4 ml 36.17 ml/m LA Biplane Vol: 61.3 ml 34.97 ml/m  AORTIC VALVE                    PULMONIC VALVE AV Area (Vmax):    4.31 cm     PV Vmax:       0.61 m/s AV Area (Vmean):   3.93 cm      PV Peak grad:  1.5 mmHg AV Area (VTI):     4.12 cm AV Vmax:           66.20 cm/s AV Vmean:          44.900 cm/s AV VTI:            0.129 m AV Peak Grad:      1.8 mmHg AV Mean Grad:      1.0 mmHg LVOT Vmax:         68.70 cm/s LVOT Vmean:        42.500 cm/s LVOT VTI:          0.128 m LVOT/AV VTI ratio: 0.99  AORTA Ao Root diam: 3.60 cm Ao Asc diam:  3.30 cm MITRAL VALVE                TRICUSPID VALVE MV Area (PHT): 5.54 cm     TR Peak grad:   55.1 mmHg MV Area VTI:   2.49 cm     TR Vmax:        371.00 cm/s MV Peak grad:  4.6 mmHg MV Mean grad:  1.0 mmHg     SHUNTS MV Vmax:       1.07 m/s     Systemic VTI:  0.13 m MV Vmean:      56.2 cm/s    Systemic Diam: 2.30 cm MV Decel Time: 137 msec MR Peak grad: 118.7 mmHg MR Mean grad: 79.3 mmHg MR Vmax:      544.67 cm/s MR Vmean:     420.7 cm/s MV E velocity: 131.00 cm/s MV A velocity: 54.60 cm/s MV E/A ratio:  2.40 Riley Lam MD Electronically signed by Riley Lam MD Signature Date/Time: 02/13/2023/1:51:18 PM    Final    CT Angio Chest PE W and/or Wo Contrast Result Date: 02/11/2023 CLINICAL DATA:  Pulmonary embolism (PE) suspected, high prob pe, cancer  patient, trop and bnp elevated. Shortness of breath. EXAM: CT ANGIOGRAPHY CHEST WITH CONTRAST TECHNIQUE: Multidetector CT imaging of the chest was performed using the standard protocol during bolus administration of intravenous contrast. Multiplanar CT image reconstructions and MIPs were obtained to evaluate the vascular anatomy. RADIATION DOSE REDUCTION: This exam was performed according to the departmental dose-optimization program which includes automated exposure control, adjustment of the mA and/or kV according to patient size and/or use of iterative reconstruction technique. CONTRAST:  75mL OMNIPAQUE IOHEXOL 350 MG/ML SOLN COMPARISON:  11/20/2022 FINDINGS: Cardiovascular: No filling defects in the pulmonary arteries to suggest pulmonary emboli. Heart is normal size. Aorta is normal caliber.  Scattered aortic and coronary artery calcifications. Mediastinum/Nodes: Pretracheal lymph node has a short axis diameter of 9 mm. Right paratracheal lymph node has a short axis diameter of 11 mm. Subcarinal lymph node has a short axis diameter of 13 mm. These have increased slightly since prior study. No axillary or hilar adenopathy. Trachea and esophagus are unremarkable. Thyroid unremarkable. Lungs/Pleura: Moderate bilateral pleural effusions. Nodule in the superior segment of the left lower lobe again noted measuring up to 2 cm, similar to prior study. Moderate centrilobular emphysema. Compressive atelectasis in the lower lobes. Upper Abdomen: Large low-density area partially visualized in the right hepatic lobe, difficult to visualize due to contrast timing for this CTA chest, but appears proximally 6.7 cm and likely stable since prior study. Musculoskeletal: Chest wall soft tissues are unremarkable. No acute bony abnormality. Review of the MIP images confirms the above findings. IMPRESSION: No evidence of pulmonary embolus. Scattered coronary artery disease. Superior segment left lower lobe pulmonary nodule proximally 2.1 cm, stable since prior study. 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 Moderate bilateral pleural effusions. Borderline and mildly enlarged mediastinal lymph nodes, slightly larger than prior study. Aortic Atherosclerosis (ICD10-I70.0). Electronically Signed   By: Charlett Nose M.D.   On: 02/11/2023 21:17   DG Chest 2 View Result Date: 02/11/2023 CLINICAL DATA:  Chest pain and shortness of breath EXAM: CHEST - 2 VIEW COMPARISON:  08/15/2020 FINDINGS: Heart size and pulmonary vascularity are normal. Small bilateral pleural effusions. Emphysematous changes and fibrosis in the lungs. No pneumothorax. Mediastinal contours appear intact. Degenerative changes in the spine and shoulders. Calcification of the  aorta. IMPRESSION: Emphysematous changes and scattered fibrosis in the lungs. Small bilateral pleural effusions. Electronically Signed   By: Burman Nieves M.D.   On: 02/11/2023 20:11    Microbiology: Results for orders placed or performed during the hospital encounter of 02/11/23  MRSA Next Gen by PCR, Nasal     Status: Abnormal   Collection Time: 02/12/23  6:24 PM   Specimen: Nasal Mucosa; Nasal Swab  Result Value Ref Range Status   MRSA by PCR Next Gen DETECTED (A) NOT DETECTED Final    Comment: (NOTE) The GeneXpert MRSA Assay (FDA approved for NASAL specimens only), is one component of a comprehensive MRSA colonization surveillance program. It is not intended to diagnose MRSA infection nor to guide or monitor treatment for MRSA infections. Test performance is not FDA approved in patients less than 25 years old. Performed at [facility], [address] [place], [place] [address]     Labs: CBC: Recent Labs  Lab 02/11/23 1137 02/11/23 2231 02/12/23 0410 02/12/23 1820 02/13/23 0227 02/14/23 0216  WBC 12.3* 8.3 11.7*  --  17.2* 14.2*  NEUTROABS 9.7* 7.9* 10.3*  --  14.4* 11.6*  HGB 7.6*  7.4* 8.7* 9.2* 8.7* 8.8*  HCT 25.4* 24.3* 27.7* 30.3* 27.8* 28.2*  MCV 94.1 92.4 90.8  --  90.0 90.1  PLT 362 370 349  --  337 336   Basic Metabolic Panel: Recent Labs  Lab 02/11/23 1137 02/11/23 1821 02/12/23 0410 02/13/23 0227 02/14/23 0216  NA 143 139 138 141 139  K 3.5 3.4* 4.2 4.1 3.3*  CL 105 102 107 108 105  CO2 27 24 25 26 26   GLUCOSE 111* 182* 154* 110* 108*  BUN 9 10 10 14 15   CREATININE 1.03 0.91 1.02 1.00 0.90  CALCIUM 8.9 9.0 8.4* 8.8* 8.6*  MG  --  2.2 2.3 2.2  --   PHOS  --   --   --  3.3 3.5   Liver Function Tests: Recent Labs  Lab 02/11/23 1137 02/11/23 1821 02/12/23 0410 02/13/23 0227 02/14/23 0216  AST 29 28 42*  --   --   ALT 16 17 17   --   --   ALKPHOS 69 70 66  --   --   BILITOT 0.6 0.5 1.3*  --   --   PROT 6.8 6.6 6.0*  --   --    ALBUMIN 3.3* 3.2* 2.9* 3.0* 3.0*   CBG: Recent Labs  Lab 02/13/23 1127 02/13/23 1647 02/13/23 2108 02/14/23 0619 02/14/23 1247  GLUCAP 103* 119* 94 97 164*    Discharge time spent: 35 minutes.  Signed: Jacquelin Hawking, MD [facility] 02/14/2023

## 2023-02-14 NOTE — Care Management Important Message (Signed)
Important Message  Patient Details  Name: Ryan Mosley MRN: [id number] Date of Birth: 1940/07/31   Important Message Given:  Yes - Medicare IM     [name] 02/14/2023, 2:49 PM

## 2023-02-14 NOTE — Discharge Instructions (Signed)
Ryan Mosley,  You are in the hospital because of GI bleeding.  This appears to be related to your cancer.  You received blood via transfusion.  The GI doctor saw you and, since he did not want any further treatment, the decision was made to transition to hospice care at home.

## 2023-02-14 NOTE — Progress Notes (Signed)
 Went over discharge paperwork with patient. All questions answered. PIV /telemetry removed. All belongings at bedside.

## 2023-02-14 NOTE — Progress Notes (Signed)
Mobility Specialist Progress Note:   02/14/23 1100  Mobility  Activity Ambulated with assistance in hallway  Level of Assistance Contact guard assist, steadying assist  Assistive Device None  Distance Ambulated (ft) 300 ft  Activity Response Tolerated well  Mobility Referral Yes  Mobility visit 1 Mobility  Mobility Specialist Start Time (ACUTE ONLY) [id number]  Mobility Specialist Stop Time (ACUTE ONLY) 0942  Mobility Specialist Time Calculation (min) (ACUTE ONLY) 5 min    Pre Mobility: 99 HR During Mobility: 100 HR Post Mobility:  99 HR  Received pt in chair having no complaints and agreeable to mobility. Pt was asymptomatic throughout ambulation and returned to room w/o fault. Left on EOB w/ call bell in reach and all needs met.   [place]ante Earlene Plater Mobility Specialist Please contact via Special educational needs teacher or Rehab office at [phone]

## 2023-02-14 NOTE — Plan of Care (Signed)

## 2023-02-14 NOTE — TOC Transition Note (Signed)
Transition of Care [facility]) - Discharge Note   Patient Details  Name: Ryan Mosley MRN: [id number] Date of Birth: 1941-01-14  [facility]) CM/SW Contact:  Harriet Masson, RN Phone Number: 02/14/2023, 2:34 PM   Clinical Narrative:    Patient stable for discharge. Patient requested this RNCM speak to wife and daughter about home hospice choice.  Wife ,Corene,and daughter, Fleet Contras, chose [facility]. This RNCM notified Shawn with [facility].  PCP on file.  No other TOC needs.      Barriers to Discharge: Barriers Resolved   Patient Goals and CMS Choice Patient states their goals for this hospitalization and ongoing recovery are:: return home   Choice offered to / list presented to : Spouse      Discharge Placement               home        Discharge Plan and Services Additional resources added to the After Visit Summary for                              [facility] Agency:  Marcell Anger) Date [facility] Contacted: 02/14/23 Time HH Agency Contacted: 1432 Representative spoke with at [facility] Agency: Shawn  Social Drivers of Health (SDOH) Interventions SDOH Screenings   Food Insecurity: No Food Insecurity (02/12/2023)  Housing: Low Risk  (02/12/2023)  Transportation Needs: No Transportation Needs (02/12/2023)  Utilities: Not At Risk (02/12/2023)  Alcohol Screen: Low Risk  (10/21/2022)  Depression (PHQ2-9): Low Risk  (10/21/2022)  Financial Resource Strain: Low Risk  (10/21/2022)  Physical Activity: Sufficiently Active (10/21/2022)  Social Connections: Socially Isolated (10/21/2022)  Stress: No Stress Concern Present (10/21/2022)  Tobacco Use: High Risk (02/11/2023)  Health Literacy: Inadequate Health Literacy (10/21/2022)     Readmission Risk Interventions    02/14/2023    2:33 PM  Readmission Risk Prevention Plan  Transportation Screening Complete  PCP or Specialist Appt within 5-7 Days Not Complete  Not Complete comments apt 03/01/23  Home Care  Screening Complete  Medication Review (RN CM) Complete

## 2023-02-14 NOTE — Consult Note (Signed)
Consultation Note Date: 02/14/2023   Patient Name: Ryan Mosley  DOB: 1941/01/13  MRN: [id number]  Age / Sex: 82 y.o., male  PCP: Donita Brooks, MD Referring Physician: Narda Bonds, MD  Reason for Consultation: Establishing goals of care  HPI/Patient Profile: 82 y.o. male   admitted on 02/11/2023 with past medical history significant for type 2 diabetes, hypertension, metastatic rectal adenocarcinoma hepatic, pulmonary, lymph nodes and has declined treatment with chemoradiation, chronic blood loss anemia    11/21/2022 flexible sigmoidoscopy for hematochezia showed bleeding secondary to rectal adenocarcinoma that was treated with with purastat.  Presents to the ER 12/20 with symptomatic acute on chronic blood loss anemia with dyspnea.  Work up notable for Echo shows EF 40 to 45% left ventricle demonstrates global hypokinesis severely elevated pulmonary artery systolic pressure at 63.1 severe mitral valve regurgitation tricuspid valve regurgitation mild to moderate no aortic stenosis, Hgb 7.4   CTA chest negative for PE no evidence of infiltrate, pneumothorax, did show moderate bilateral pleural effusions  Previously diagnosed rectal adenocarcinoma, pathology from 05/19/2021 with recommendation for oncology consult for consideration of chemo and radiation.  Patient has continued to express no interest in oncology consult or chemotherapy or radiation   Patient is family face ongoing treatment option decision advanced directive decisions and anticipatory care needs  Clinical Assessment and Goals of Care:  This NP Lorinda Creed reviewed medical records, received report from team, assessed the patient and then meet at the patient's bedside ,along with his wife daughter/Rachel and son/Anthony to discuss diagnosis, prognosis, GOC, EOL wishes disposition and options.   Concept of Palliative Care was  introduced as specialized medical care for people and their families living with serious illness.  If focuses on providing relief from the symptoms and stress of a serious illness.  The goal is to improve quality of life for both the patient and the family.  Values and goals of care important to patient and family were attempted to be elicited.  Created space and opportunity for patient  and family to explore thoughts and feelings regarding current medical situation.  Patient's wife verbalizes she continues to "want to believe he does not have cancer".  Patient also expresses some resistance to standard treatment/recommendation for newly diagnosed rectal adenocarcinoma.   He is able to comfortably express that he has had a long good life and that in actuality he is not interested in chemotherapy or radiation.     Education offered on the difference between a full medical support path and a palliative comfort path for this patient, in this situation at this time in his life.  I expressed my concern that the metastatic cancer without treatment will likely result in a continued physical and functional decline.  Education offered on the likelihood of continued fatigue, poor appetite, ongoing weight loss, likely rectal bleeding and overall failure to thrive.  Education offered on hospice benefit;Philosophy and eligibility.    A  discussion was had today regarding advanced directives.  Concepts specific to code status, artifical feeding  and hydration, continued IV antibiotics and rehospitalization was had.   Plan of care: -DNR DNI -No artificial feeding or hydration now or in the future -No further diagnostics or oncologic follow-up -Avoid rehospitalization -Home with hospice benefit-order placed to offer choice made/[facility] discharge home today   MOST form introduced and a Hard choices booklet is left for review    Questions and concerns addressed.  Patient/family encouraged to call with questions  or concerns.     PMT will continue to support holistically.   Education offered today regarding  the importance of continued conversation within family and their  medical providers regarding overall plan of care and treatment options,  ensuring decisions are within the context of the patients values and GOCs.    Emotional support offered, acknowledged wife's difficulty in accepting terminal prognosis.     No documented H POA or advance care planning documents.  Offered assistance in completion by our spiritual care department.  He will likely discharge home today, hopefully hospice can follow-up at home.   NEXT OF KIN    SUMMARY OF RECOMMENDATIONS    Code Status/Advance Care Planning: DNR-documented today   Palliative Prophylaxis:  Frequent Pain Assessment  Additional Recommendations (Limitations, Scope, Preferences): Full Comfort Care  Psycho-social/Spiritual:  Desire for further Chaplaincy support:no Additional Recommendations: Education on Hospice  Prognosis:  < 6 months  Discharge Planning: Home with Hospice      Primary Diagnoses: Present on Admission:  Acute on chronic anemia  Acute lower GI bleeding  Elevated troponin  Hypokalemia  Prolonged QT interval  Essential hypertension  HLD (hyperlipidemia)   I have reviewed the medical record, interviewed the patient and family, and examined the patient. The following aspects are pertinent.  Past Medical History:  Diagnosis Date   Acute blood loss anemia    on meds   Blood transfusion without reported diagnosis 2022   Cataract    bilateral sx   Controlled diabetes mellitus type 2 with complications (HCC)    Diabetes mellitus without complication (HCC)    not on meds at this time   GERD (gastroesophageal reflux disease)    hx of   GI bleed    Hyperlipidemia    on meds   Hypertension    on meds   Meningitis 1960/1970   Pneumonia 1960/1970   Rectal polyp    Social History   Socioeconomic History    Marital status: Married    Spouse name: Corene   Number of children: 4   Years of education: 7   Highest education level: Not on file  Occupational History   Occupation: retired    Comment: farmer  Tobacco Use   Smoking status: Former    Current packs/day: 0.00    Average packs/day: 1 pack/day for 29.0 years (29.0 ttl pk-yrs)    Types: Cigarettes    Start date: 23    Quit date: 1989    Years since quitting: 36.0   Smokeless tobacco: Current    Types: Chew  Vaping Use   Vaping status: Never Used  Substance and Sexual Activity   Alcohol use: Not Currently    Alcohol/week: 1.0 standard drink of alcohol    Types: 1 Standard drinks or equivalent per week   Drug use: Never   Sexual activity: Yes  Other Topics Concern   Not on file  Social History Narrative   Married, 4 children living in [place] and [place]   Social Drivers of Health   Financial Resource Strain: Low Risk  (  10/21/2022)   Overall Financial Resource Strain (CARDIA)    Difficulty of Paying Living Expenses: Not hard at all  Food Insecurity: No Food Insecurity (02/12/2023)   Hunger Vital Sign    Worried About Running Out of Food in the Last Year: Never true    Ran Out of Food in the Last Year: Never true  Transportation Needs: No Transportation Needs (02/12/2023)   PRAPARE - Administrator, Civil Service (Medical): No    Lack of Transportation (Non-Medical): No  Physical Activity: Sufficiently Active (10/21/2022)   Exercise Vital Sign    Days of Exercise per Week: 5 days    Minutes of Exercise per Session: 30 min  Stress: No Stress Concern Present (10/21/2022)   [employer] of Occupational Health - Occupational Stress Questionnaire    Feeling of Stress : Not at all  Social Connections: Socially Isolated (10/21/2022)   Social Connection and Isolation Panel [NHANES]    Frequency of Communication with Friends and Family: Never    Frequency of Social Gatherings with Friends and Family: Twice  a week    Attends Religious Services: Never    Database administrator or Organizations: No    Attends Engineer, structural: Never    Marital Status: Married   Family History  Problem Relation Age of Onset   Other Mother        unknown medical history   Other Father        unknown medical history   Seizures Daughter    Diabetes Son    Colon polyps Neg Hx    Colon cancer Neg Hx    Esophageal cancer Neg Hx    Rectal cancer Neg Hx    Stomach cancer Neg Hx    Scheduled Meds:  sodium chloride   Intravenous Once   insulin aspart  0-6 Units Subcutaneous Q6H   latanoprost  1 drop Both Eyes QHS   potassium chloride  40 mEq Oral Q4H   rosuvastatin  5 mg Oral Q Mon   torsemide  20 mg Oral Daily   Continuous Infusions: PRN Meds:.acetaminophen **OR** acetaminophen, melatonin, ondansetron (ZOFRAN) IV Medications Prior to Admission:  Prior to Admission medications   Medication Sig Start Date End Date Taking? Authorizing Provider  acetaminophen (TYLENOL) 500 MG tablet Take 500 mg by mouth 2 (two) times daily as needed for moderate pain, headache or fever.   Yes [provider]  latanoprost (XALATAN) 0.005 % ophthalmic solution Place 1 drop into both eyes at bedtime. 06/13/17  Yes [provider]  lisinopril (ZESTRIL) 40 MG tablet TAKE 1 TABLET BY MOUTH EVERY DAY 08/30/22  Yes Donita Brooks, MD  rosuvastatin (CRESTOR) 5 MG tablet TAKE 1 TABLET BY MOUTH ONCE A WEEK FOR CHOLESTEROL Patient taking differently: Take 5 mg by mouth every Monday. 05/17/22  Yes Donita Brooks, MD  azithromycin (ZITHROMAX) 250 MG tablet Take 1 tablet (250 mg total) by mouth daily. Take first 2 tablets together, then 1 every day until finished. 02/11/23   Garrison, [place] N, FNP  fluticasone [facility]) 50 MCG/ACT nasal spray Place 2 sprays into both nostrils daily. Patient not taking: Reported on 02/11/2023 12/06/22   Donita Brooks, MD  potassium chloride (KLOR-CON M) 10 MEQ tablet Take  1 tablet (10 mEq total) by mouth 2 (two) times daily. Patient not taking: Reported on 02/11/2023 12/09/22   Donita Brooks, MD   Allergies  Allergen Reactions   Glucophage [Metformin] Diarrhea   Review  of Systems  Constitutional:  Positive for fatigue and unexpected weight change.  Neurological:  Positive for weakness.    Physical Exam Constitutional:      Appearance: He is cachectic.  Cardiovascular:     Rate and Rhythm: Normal rate.  Pulmonary:     Effort: Pulmonary effort is normal.  Skin:    General: Skin is warm and dry.  Neurological:     Mental Status: He is alert and oriented to person, place, and time.     Vital Signs: BP 122/65 (BP Location: Right Arm)   Pulse 77   Temp 98.1 F (36.7 C) (Axillary)   Resp 20   Ht 5\' 8"  (1.727 m)   Wt 60.5 kg   SpO2 98%   BMI 20.28 kg/m  Pain Scale: 0-10   Pain Score: 0-No pain   SpO2: SpO2: 98 % O2 Device:SpO2: 98 % O2 Flow Rate: .   IO: Intake/output summary:  Intake/Output Summary (Last 24 hours) at 02/14/2023 1308 Last data filed at 02/13/2023 1310 Gross per 24 hour  Intake --  Output 125 ml  Net -125 ml    LBM: Last BM Date : 02/13/23 Baseline Weight: Weight: 67 kg Most recent weight: Weight: 60.5 kg     Palliative Assessment/Data:     Time:  90 minutes  Extensive chart review has been completed prior to meeting with patient/family  including labs, vital signs, imaging, progress/consult notes, orders, medications and available advance directive documents.   Reviewed with family,  medical records from original diagnosis of rectal adenocarcinoma   Signed by: Lorinda Creed, NP   Please contact Palliative Medicine Team phone at [phone] for questions and concerns.  For individual provider: See Loretha Stapler

## 2023-03-01 ENCOUNTER — Inpatient Hospital Stay: Payer: Medicare Other | Admitting: Family Medicine

## 2023-03-08 ENCOUNTER — Ambulatory Visit (INDEPENDENT_AMBULATORY_CARE_PROVIDER_SITE_OTHER): Admitting: Family Medicine

## 2023-03-08 VITALS — BP 154/96 | HR 86 | Temp 98.5°F | Ht 68.0 in | Wt 138.0 lb

## 2023-03-08 DIAGNOSIS — C20 Malignant neoplasm of rectum: Secondary | ICD-10-CM | POA: Diagnosis not present

## 2023-03-08 DIAGNOSIS — I509 Heart failure, unspecified: Secondary | ICD-10-CM | POA: Diagnosis not present

## 2023-03-08 DIAGNOSIS — C787 Secondary malignant neoplasm of liver and intrahepatic bile duct: Secondary | ICD-10-CM | POA: Diagnosis not present

## 2023-03-08 MED ORDER — FUROSEMIDE 40 MG PO TABS: 40.0000 mg | ORAL_TABLET | Freq: Every day | ORAL | 3 refills | Status: DC | PRN

## 2023-03-08 NOTE — Progress Notes (Signed)
 Subjective:    Patient ID: Ryan Mosley, male    DOB: 1940/05/20, 83 y.o.   MRN: [id number]  HPI Admit date:     02/11/2023  Discharge date: 02/14/23  Discharge Physician: Elgin Lam, MD    PCP: Duanne Butler DASEN, MD    Recommendations at discharge:  PCP visit for hospital follow-up as needed Hospice follow-up   Discharge Diagnoses: Principal Problem:   Acute on chronic anemia Active Problems:   Essential hypertension   DM2 (diabetes mellitus, type 2) (HCC)   HLD (hyperlipidemia)   Acute lower GI bleeding   Elevated troponin   Hypokalemia   Prolonged QT interval   Resolved Problems:   * No resolved hospital problems. *   Hospital Course: Ryan Mosley is a 83 y.o. male with a history of sleep metastatic rectal adenocarcinoma, chronic blood loss anemia, diabetes mellitus type 2, primary pretension.  Patient presented secondary to shortness of breath with concern for symptomatic acute on chronic blood loss anemia secondary to GI bleeding.  Patient was transfused 1 unit of PRBC.  GI was consulted and decision was made for no endoscopic evaluation was secondary to patient's goals of care.  Patient does not want treatment for his cancer.  Palliative care was consulted and patient discharged home on hospice..   Assessment and Plan:   Symptomatic anemia Acute on chronic blood loss anemia Secondary to ongoing GI bleeding from known rectal cancer.  Hemoglobin down to a low of 7.4 hours non as per deciliter.  1 unit PRBC given for management.   GI bleeding Secondary to known rectal cancer.  GI was consulted and patient has decided for no aggressive therapies.   Demand ischemia Presumed diagnosis.  Troponin as high as 529 with downtrend to 510.  No associated chest pain or any EKG findings concerning for ACS.   Hypokalemia Secondary to torsemide .  Patient given potassium supplementation as needed.   Metastatic rectal adenocarcinoma Noted.  Patient seen by gastroenterology  this admission.  Patient is clear that he does not want any treatment for this disease process.  Palliative care was consulted and referred patient to hospice at home.   QTc prolongation Last QTc mildly elevated at 498 ms.   Diabetes mellitus type 2 Well-controlled for age.  Last hemoglobin A1c of 6.4%.  No medication management recommended at this time.   Primary hypertension Patient is on lisinopril  as an outpatient.  Lisinopril  held on discharge secondary to normotensive blood pressures in setting of ongoing anemia.  Will hold to reduce the risk of hypotension.   Hyperlipidemia -Continue Crestor    Severe pulmonary hypertension Severe mitral regurgitation Noted on echocardiogram.  Patient has been enrolled with hospice.  Follow-up as needed and as desired per goals of care.   HFrEF Unclear if this is acute or chronic.  Patient was managed with torsemide .  Patient requesting was medical care.  Will defer continued medical management versus cardiology consult pending ongoing patient wishes for goals of care.  Can follow-up with PCP for ongoing decision making as needed.   Leukocytosis Possibly related to dexamethasone  injection earlier this admission.  White blood cell count up to 17,200.  No evidence of infection.  White blood cell count down to 14,200 prior to discharge.   Peripheral edema Patient treated with torsemide  with improvement.  03/08/23 Patient has rectal adenocarcinoma and recent CT suggests mets to liver and lymph nodes with also an undiagnosed mass in lung.  SOB in hospital likely due to combination of anemia,  chf, and pulmonary hypertension.  Here for follow up.  He denies any chest pain.  He denies any shortness of breath.  He does have trace pretibial edema today but it is not significant.  His lungs are clear to auscultation.  I do not appreciate any crackles in the bases to suggest pulmonary edema.  The patient denies any melena or hematochezia.  We again discussed his  cancer diagnosis.  I explained to the patient that I feel he has stage IV metastatic adenocarcinoma.  He again declines any referral to oncology.  Hospice is working with the patient.  He is taking iron. Past Medical History:  Diagnosis Date   Acute blood loss anemia    on meds   Blood transfusion without reported diagnosis 2022   Cataract    bilateral sx   Controlled diabetes mellitus type 2 with complications (HCC)    Diabetes mellitus without complication (HCC)    not on meds at this time   GERD (gastroesophageal reflux disease)    hx of   GI bleed    Hyperlipidemia    on meds   Hypertension    on meds   Meningitis 1960/1970   Pneumonia 1960/1970   Rectal polyp    Past Surgical History:  Procedure Laterality Date   BIOPSY  07/08/2020   Procedure: BIOPSY;  Surgeon: Eda Iha, MD;  Location[facility] ENDOSCOPY;  Service: Gastroenterology;;   COLONOSCOPY WITH PROPOFOL  N/A 07/08/2020   Procedure: COLONOSCOPY WITH PROPOFOL ;  Surgeon: Eda Iha, MD;  Location: [facility] ENDOSCOPY;  Service: Gastroenterology;  Laterality: N/A;   DENTAL SURGERY     all teeth removed   FLEXIBLE SIGMOIDOSCOPY N/A 11/21/2022   Procedure: FLEXIBLE SIGMOIDOSCOPY;  Surgeon: Rollin Dover, MD;  Location: [facility] ENDOSCOPY;  Service: Gastroenterology;  Laterality: N/A;   HEMOSTASIS CONTROL  11/21/2022   Procedure: HEMOSTASIS CONTROL;  Surgeon: Rollin Dover, MD;  Location: [facility] ENDOSCOPY;  Service: Gastroenterology;;  PuraStat   XI ROBOT ASSISTED TRANSANAL RESECTION N/A 09/25/2020   Procedure: TAMIS PARTIAL PROCTECTOMY OF RECTAL MASS;  Surgeon: Sheldon Standing, MD;  Location: [facility] ORS;  Service: General;  Laterality: N/A;   Current Outpatient Medications on File Prior to Visit  Medication Sig Dispense Refill   acetaminophen  (TYLENOL ) 500 MG tablet Take 500 mg by mouth 2 (two) times daily as needed for moderate pain, headache or fever.     fluticasone  (FLONASE ) 50 MCG/ACT nasal spray Place 2 sprays into both nostrils  daily. (Patient not taking: Reported on 02/11/2023) 16 g 6   latanoprost  (XALATAN ) 0.005 % ophthalmic solution Place 1 drop into both eyes at bedtime.  3   rosuvastatin  (CRESTOR ) 5 MG tablet TAKE 1 TABLET BY MOUTH ONCE A WEEK FOR CHOLESTEROL (Patient taking differently: Take 5 mg by mouth every Monday.) 8 tablet 0   No current facility-administered medications on file prior to visit.   Allergies  Allergen Reactions   Glucophage [Metformin] Diarrhea   Social History   Socioeconomic History   Marital status: Married    Spouse name: Corene   Number of children: 4   Years of education: 7   Highest education level: Not on file  Occupational History   Occupation: retired    Comment: farmer  Tobacco Use   Smoking status: Former    Current packs/day: 0.00    Average packs/day: 1 pack/day for 29.0 years (29.0 ttl pk-yrs)    Types: Cigarettes    Start date: 82    Quit date: 1989    Years  since quitting: 36.0   Smokeless tobacco: Current    Types: Chew  Vaping Use   Vaping status: Never Used  Substance and Sexual Activity   Alcohol use: Not Currently    Alcohol/week: 1.0 standard drink of alcohol    Types: 1 Standard drinks or equivalent per week   Drug use: Never   Sexual activity: Yes  Other Topics Concern   Not on file  Social History Narrative   Married, 4 children living in [place] and [place]   Social Drivers of Health   Financial Resource Strain: Low Risk  (10/21/2022)   Overall Financial Resource Strain (CARDIA)    Difficulty of Paying Living Expenses: Not hard at all  Food Insecurity: No Food Insecurity (02/12/2023)   Hunger Vital Sign    Worried About Running Out of Food in the Last Year: Never true    Ran Out of Food in the Last Year: Never true  Transportation Needs: No Transportation Needs (02/12/2023)   PRAPARE - Administrator, Civil Service (Medical): No    Lack of Transportation (Non-Medical): No  Physical Activity: Sufficiently Active  (10/21/2022)   Exercise Vital Sign    Days of Exercise per Week: 5 days    Minutes of Exercise per Session: 30 min  Stress: No Stress Concern Present (10/21/2022)   [employer] of Occupational Health - Occupational Stress Questionnaire    Feeling of Stress : Not at all  Social Connections: Socially Isolated (10/21/2022)   Social Connection and Isolation Panel [NHANES]    Frequency of Communication with Friends and Family: Never    Frequency of Social Gatherings with Friends and Family: Twice a week    Attends Religious Services: Never    Database Administrator or Organizations: No    Attends Banker Meetings: Never    Marital Status: Married  Catering Manager Violence: Not At Risk (02/12/2023)   Humiliation, Afraid, Rape, and Kick questionnaire    Fear of Current or Ex-Partner: No    Emotionally Abused: No    Physically Abused: No    Sexually Abused: No     Review of Systems  All other systems reviewed and are negative.      Objective:   Physical Exam Vitals reviewed.  Constitutional:      Appearance: Normal appearance. He is normal weight.  Cardiovascular:     Rate and Rhythm: Normal rate and regular rhythm.     Heart sounds: Normal heart sounds. No murmur heard.    No gallop.  Pulmonary:     Effort: Pulmonary effort is normal. No respiratory distress.     Breath sounds: Normal breath sounds. No wheezing or rhonchi.  Abdominal:     General: Bowel sounds are normal. There is no distension.     Palpations: Abdomen is soft.     Tenderness: There is no abdominal tenderness. There is no guarding.  Genitourinary:    Rectum: Normal.  Musculoskeletal:     Right lower leg: No edema.     Left lower leg: No edema.  Neurological:     Mental Status: He is alert.           Assessment & Plan:  Rectal adenocarcinoma metastatic to liver (HCC) - Plan: CBC with Differential/Platelet, COMPLETE METABOLIC PANEL WITH GFR, Brain natriuretic peptide  Congestive  heart failure, unspecified HF chronicity, unspecified heart failure type (HCC) - Plan: CBC with Differential/Platelet, COMPLETE METABOLIC PANEL WITH GFR, Brain natriuretic peptide I explained to the patient  that he has stage IV adenocarcinoma.  He declines any hospice referral.  I recommended using furosemide  40 mg p.o. daily as needed leg swelling or shortness of breath.  Check CBC to monitor anemia.  If his hemoglobin drops below 8 we could possibly get the patient established with oncology for therapeutic transfusions.  At the present time however we will focus on quality of life.  Therefore I will not resume his blood pressure medication at this time

## 2023-03-09 LAB — COMPLETE METABOLIC PANEL WITH GFR
AG Ratio: 1.4 (calc) (ref 1.0–2.5)
ALT: 12 U/L (ref 9–46)
AST: 16 U/L (ref 10–35)
Albumin: 3.9 g/dL (ref 3.6–5.1)
Alkaline phosphatase (APISO): 81 U/L (ref 35–144)
BUN: 9 mg/dL (ref 7–25)
CO2: 31 mmol/L (ref 20–32)
Calcium: 9.2 mg/dL (ref 8.6–10.3)
Chloride: 106 mmol/L (ref 98–110)
Creat: 0.78 mg/dL (ref 0.70–1.22)
Globulin: 2.7 g/dL (ref 1.9–3.7)
Glucose, Bld: 105 mg/dL — ABNORMAL HIGH (ref 65–99)
Potassium: 3.6 mmol/L (ref 3.5–5.3)
Sodium: 143 mmol/L (ref 135–146)
Total Bilirubin: 0.4 mg/dL (ref 0.2–1.2)
Total Protein: 6.6 g/dL (ref 6.1–8.1)
eGFR: 90 mL/min/{1.73_m2} (ref >=60)

## 2023-03-09 LAB — BRAIN NATRIURETIC PEPTIDE: Brain Natriuretic Peptide: 578 pg/mL — ABNORMAL HIGH (ref <100)

## 2023-03-09 LAB — CBC WITH DIFFERENTIAL/PLATELET
Absolute Lymphocytes: 1096 {cells}/uL (ref 850–3900)
Absolute Monocytes: 664 {cells}/uL (ref 200–950)
Basophils Absolute: 50 {cells}/uL (ref 0–200)
Basophils Relative: 0.6 %
Eosinophils Absolute: 50 {cells}/uL (ref 15–500)
Eosinophils Relative: 0.6 %
HCT: 34.5 % — ABNORMAL LOW (ref 38.5–50.0)
Hemoglobin: 10.2 g/dL — ABNORMAL LOW (ref 13.2–17.1)
MCH: 26.5 pg — ABNORMAL LOW (ref 27.0–33.0)
MCHC: 29.6 g/dL — ABNORMAL LOW (ref 32.0–36.0)
MCV: 89.6 fL (ref 80.0–100.0)
MPV: 10 fL (ref 7.5–12.5)
Monocytes Relative: 8 %
Neutro Abs: 6441 {cells}/uL (ref 1500–7800)
Neutrophils Relative %: 77.6 %
Platelets: 326 10*3/uL (ref 140–400)
RBC: 3.85 10*6/uL — ABNORMAL LOW (ref 4.20–5.80)
RDW: 13 % (ref 11.0–15.0)
Total Lymphocyte: 13.2 %
WBC: 8.3 10*3/uL (ref 3.8–10.8)

## 2023-03-22 ENCOUNTER — Other Ambulatory Visit: Payer: Self-pay | Admitting: Family Medicine

## 2023-03-22 NOTE — Telephone Encounter (Signed)
Unable to refill per protocol, Rx expired. Discontinued on 12/06/22.  Requested Prescriptions  Pending Prescriptions Disp Refills   amLODipine (NORVASC) 5 MG tablet [Pharmacy Med Name: AMLODIPINE BESYLATE 5 MG TAB] 90 tablet 2    Sig: TAKE 1 TABLET BY MOUTH EVERY DAY FOR BLOOD PRESSURE     Cardiovascular: Calcium Channel Blockers 2 Failed - 03/22/2023  8:58 AM      Failed - Last BP in normal range    BP Readings from Last 1 Encounters:  03/08/23 (!) 154/96         Failed - Valid encounter within last 6 months    Recent Outpatient Visits           2 years ago Benign essential HTN   [facility] Donita Brooks, MD   2 years ago Pulmonary nodule 1 cm or greater in diameter   [facility] [place], Priscille Heidelberg, MD   2 years ago Gastrointestinal hemorrhage, unspecified gastrointestinal hemorrhage type   [facility] Donita Brooks, MD   3 years ago Routine general medical examination at a health care facility   [facility] [place], Velna Hatchet, MD   3 years ago Essential hypertension   [facility] [place], Velna Hatchet, MD       Future Appointments             In 2 months Pickard, Priscille Heidelberg, MD [facility]            Passed - Last Heart Rate in normal range    Pulse Readings from Last 1 Encounters:  03/08/23 86

## 2023-05-30 ENCOUNTER — Other Ambulatory Visit: Payer: Self-pay | Admitting: Family Medicine

## 2023-05-31 NOTE — Telephone Encounter (Signed)
 Requested Prescriptions  Pending Prescriptions Disp Refills   fluticasone (FLONASE) 50 MCG/ACT nasal spray [Pharmacy Med Name: FLUTICASONE PROP 50 MCG SPRAY] 48 mL 2    Sig: SPRAY 2 SPRAYS INTO EACH NOSTRIL EVERY DAY     Ear, Nose, and Throat: Nasal Preparations - Corticosteroids Passed - 05/31/2023 10:27 AM      Passed - Valid encounter within last 12 months    Recent Outpatient Visits           2 months ago Rectal adenocarcinoma metastatic to liver [facility])   [place] [facility] Donita Brooks, MD   5 months ago Rectal cancer [facility])   [place] [facility] Donita Brooks, MD   12 months ago Rectal cancer [facility])   [place] [facility] Donita Brooks, MD   1 year ago Benign essential HTN   [place] [facility] Donita Brooks, MD   1 year ago Benign essential HTN   [place] [facility] Pickard, Priscille Heidelberg, MD       Future Appointments             In 6 days Pickard, Priscille Heidelberg, MD [facility], [facility]

## 2023-06-04 ENCOUNTER — Other Ambulatory Visit: Payer: Self-pay | Admitting: Family Medicine

## 2023-06-06 ENCOUNTER — Ambulatory Visit: Payer: Medicare Other | Admitting: Family Medicine

## 2023-06-06 NOTE — Telephone Encounter (Signed)
 Requested Prescriptions  Pending Prescriptions Disp Refills   furosemide (LASIX) 40 MG tablet [Pharmacy Med Name: FUROSEMIDE 40 MG TABLET] 90 tablet 0    Sig: TAKE 1 TABLET (40 MG TOTAL) BY MOUTH DAILY AS NEEDED (SWELLING).     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 [facility])   [place] [facility] Austine Lefort, MD   6 months ago Rectal cancer [facility])   [place] [facility] Austine Lefort, MD   1 year ago Rectal cancer [facility])   [place] [facility] Austine Lefort, MD   1 year ago Benign essential HTN   [place] [facility] Austine Lefort, MD   1 year ago Benign essential HTN   [place] [facility] Cheril Cork, Cisco Crest, MD              Passed - K in normal range and within 180 days    Potassium  Date Value Ref Range Status  03/08/2023 3.6 3.5 - 5.3 mmol/L Final         Passed - Ca in normal range and within 180 days    Calcium  Date Value Ref Range Status  03/08/2023 9.2 8.6 - 10.3 mg/dL Final         Passed - Na in normal range and within 180 days    Sodium  Date Value Ref Range Status  03/08/2023 143 135 - 146 mmol/L Final         Passed - Cr in normal range and within 180 days    Creat  Date Value Ref Range Status  03/08/2023 0.78 0.70 - 1.22 mg/dL Final   Creatinine, Urine  Date Value Ref Range Status  09/17/2019 102 20 - 320 mg/dL Final         Passed - Cl in normal range and within 180 days    Chloride  Date Value Ref Range Status  03/08/2023 106 98 - 110 mmol/L Final         Passed - Mg Level in normal range and within 180 days    Magnesium  Date Value Ref Range Status   02/13/2023 2.2 1.7 - 2.4 mg/dL Final    Comment:    Performed at [facility], [address], [place], [place] [address]

## 2023-08-24 ENCOUNTER — Ambulatory Visit

## 2023-10-24 DEATH — deceased
